# Patient Record
Sex: Female | Born: 1980 | Race: White | Hispanic: No | Marital: Single | Smoking: Never smoker
Health system: Southern US, Community
[De-identification: ages and names within clinical notes are randomized; demographics above are authoritative.]

## PROBLEM LIST (undated history)

## (undated) DIAGNOSIS — K8020 Calculus of gallbladder without cholecystitis without obstruction: Secondary | ICD-10-CM

## (undated) DIAGNOSIS — B019 Varicella without complication: Secondary | ICD-10-CM

## (undated) DIAGNOSIS — K8010 Calculus of gallbladder with chronic cholecystitis without obstruction: Secondary | ICD-10-CM

## (undated) DIAGNOSIS — N390 Urinary tract infection, site not specified: Secondary | ICD-10-CM

## (undated) DIAGNOSIS — F32A Depression, unspecified: Secondary | ICD-10-CM

## (undated) DIAGNOSIS — F419 Anxiety disorder, unspecified: Secondary | ICD-10-CM

## (undated) HISTORY — PX: CHOLECYSTECTOMY: SHX55

## (undated) HISTORY — DX: Depression, unspecified: F32.A

## (undated) HISTORY — PX: APPENDECTOMY: SHX54

## (undated) HISTORY — DX: Calculus of gallbladder with chronic cholecystitis without obstruction: K80.10

## (undated) HISTORY — DX: Varicella without complication: B01.9

---
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
## 2003-01-11 ENCOUNTER — Emergency Department (HOSPITAL_COMMUNITY): Admission: EM | Admit: 2003-01-11 | Discharge: 2003-01-11 | Payer: Self-pay | Admitting: Emergency Medicine

## 2003-01-27 ENCOUNTER — Emergency Department (HOSPITAL_COMMUNITY): Admission: EM | Admit: 2003-01-27 | Discharge: 2003-01-27 | Payer: Self-pay | Admitting: *Deleted

## 2003-02-13 ENCOUNTER — Emergency Department (HOSPITAL_COMMUNITY): Admission: AD | Admit: 2003-02-13 | Discharge: 2003-02-13 | Payer: Self-pay | Admitting: Family Medicine

## 2003-02-22 ENCOUNTER — Inpatient Hospital Stay (HOSPITAL_COMMUNITY): Admission: AD | Admit: 2003-02-22 | Discharge: 2003-02-22 | Payer: Self-pay | Admitting: Obstetrics and Gynecology

## 2006-03-12 ENCOUNTER — Emergency Department (HOSPITAL_COMMUNITY): Admission: EM | Admit: 2006-03-12 | Discharge: 2006-03-12 | Payer: Self-pay | Admitting: Emergency Medicine

## 2006-06-25 ENCOUNTER — Emergency Department (HOSPITAL_COMMUNITY): Admission: EM | Admit: 2006-06-25 | Discharge: 2006-06-25 | Payer: Self-pay | Admitting: Family Medicine

## 2009-04-04 ENCOUNTER — Emergency Department: Payer: Self-pay | Admitting: Emergency Medicine

## 2009-04-07 ENCOUNTER — Emergency Department: Payer: Self-pay | Admitting: Emergency Medicine

## 2009-08-14 ENCOUNTER — Emergency Department (HOSPITAL_COMMUNITY): Admission: EM | Admit: 2009-08-14 | Discharge: 2009-08-14 | Payer: Self-pay | Admitting: Family Medicine

## 2009-08-17 ENCOUNTER — Emergency Department: Payer: Self-pay | Admitting: Emergency Medicine

## 2010-01-21 ENCOUNTER — Emergency Department: Payer: Self-pay | Admitting: Unknown Physician Specialty

## 2010-02-28 ENCOUNTER — Emergency Department: Payer: Self-pay | Admitting: Emergency Medicine

## 2010-03-06 ENCOUNTER — Emergency Department: Payer: Self-pay | Admitting: Emergency Medicine

## 2010-03-16 ENCOUNTER — Emergency Department: Payer: Self-pay | Admitting: Emergency Medicine

## 2010-03-29 ENCOUNTER — Emergency Department: Payer: Self-pay | Admitting: Emergency Medicine

## 2010-03-30 ENCOUNTER — Emergency Department: Payer: Self-pay | Admitting: Unknown Physician Specialty

## 2010-04-09 ENCOUNTER — Emergency Department: Payer: Self-pay | Admitting: Emergency Medicine

## 2010-04-13 ENCOUNTER — Ambulatory Visit: Payer: Self-pay | Admitting: Obstetrics and Gynecology

## 2010-04-14 ENCOUNTER — Emergency Department: Payer: Self-pay | Admitting: Internal Medicine

## 2010-04-19 ENCOUNTER — Emergency Department: Payer: Self-pay | Admitting: Emergency Medicine

## 2010-05-17 ENCOUNTER — Emergency Department: Payer: Self-pay | Admitting: Emergency Medicine

## 2010-06-01 LAB — POCT RAPID STREP A (OFFICE): Streptococcus, Group A Screen (Direct): NEGATIVE

## 2010-06-20 ENCOUNTER — Emergency Department: Payer: Self-pay | Admitting: Emergency Medicine

## 2010-06-29 ENCOUNTER — Emergency Department: Payer: Self-pay | Admitting: Emergency Medicine

## 2010-07-14 ENCOUNTER — Emergency Department: Payer: Self-pay | Admitting: Emergency Medicine

## 2011-04-22 ENCOUNTER — Emergency Department: Payer: Self-pay | Admitting: Emergency Medicine

## 2011-04-22 LAB — URINALYSIS, COMPLETE
Bilirubin,UR: NEGATIVE
Blood: NEGATIVE
Glucose,UR: NEGATIVE mg/dL (ref 0–75)
Ketone: NEGATIVE
Ph: 6 (ref 4.5–8.0)
Specific Gravity: 1.013 (ref 1.003–1.030)
Squamous Epithelial: 14

## 2011-04-22 LAB — COMPREHENSIVE METABOLIC PANEL
Alkaline Phosphatase: 46 U/L — ABNORMAL LOW (ref 50–136)
Anion Gap: 11 (ref 7–16)
BUN: 3 mg/dL — ABNORMAL LOW (ref 7–18)
Bilirubin,Total: 0.3 mg/dL (ref 0.2–1.0)
Chloride: 105 mmol/L (ref 98–107)
Creatinine: 0.83 mg/dL (ref 0.60–1.30)
EGFR (African American): >60
Potassium: 3.4 mmol/L — ABNORMAL LOW (ref 3.5–5.1)
SGPT (ALT): 28 U/L
Sodium: 139 mmol/L (ref 136–145)
Total Protein: 7.9 g/dL (ref 6.4–8.2)

## 2011-04-22 LAB — CBC
HCT: 39.4 % (ref 35.0–47.0)
HGB: 13.3 g/dL (ref 12.0–16.0)
MCHC: 33.8 g/dL (ref 32.0–36.0)
MCV: 88 fL (ref 80–100)
RDW: 11.9 % (ref 11.5–14.5)

## 2011-04-22 LAB — LIPASE, BLOOD: Lipase: 130 U/L (ref 73–393)

## 2011-04-22 LAB — PREGNANCY, URINE: Pregnancy Test, Urine: POSITIVE m[IU]/mL

## 2011-04-22 LAB — HCG, QUANTITATIVE, PREGNANCY: Beta Hcg, Quant.: 1342 m[IU]/mL — ABNORMAL HIGH

## 2011-04-24 ENCOUNTER — Emergency Department (INDEPENDENT_AMBULATORY_CARE_PROVIDER_SITE_OTHER)
Admission: EM | Admit: 2011-04-24 | Discharge: 2011-04-24 | Disposition: A | Discharge status: Home / Self Care | Payer: Self-pay | Source: Home / Self Care

## 2011-04-24 ENCOUNTER — Encounter (HOSPITAL_COMMUNITY): Payer: Self-pay | Admitting: *Deleted

## 2011-04-24 DIAGNOSIS — J069 Acute upper respiratory infection, unspecified: Secondary | ICD-10-CM

## 2011-04-24 NOTE — ED Provider Notes (Signed)
History     CSN: [id number]  Arrival date & time 04/24/11  1648   None     Chief Complaint  Patient presents with  . Cough    (Consider location/radiation/quality/duration/timing/severity/associated sxs/prior treatment) HPI Comments: Pt with cough and congestion for a week. Recently pregnant, LMP last month.   Patient is a 31 y.o. female presenting with cough. The history is provided by the patient.  Cough This is a new problem. Episode onset: a week ago. The problem occurs every few minutes. The problem has not changed since onset.The cough is non-productive. There has been no fever. Associated symptoms include headaches and rhinorrhea. Pertinent negatives include no chills, no ear pain, no sore throat and no shortness of breath. She has tried nothing for the symptoms. Her past medical history does not include asthma.    History reviewed. No pertinent past medical history.  History reviewed. No pertinent past surgical history.  History reviewed. No pertinent family history.  History  Substance Use Topics  . Smoking status: Not on file  . Smokeless tobacco: Not on file  . Alcohol Use: Not on file    OB History    Grav Para Term Preterm Abortions TAB SAB Ect Mult Living   1               Review of Systems  Constitutional: Negative for fever and chills.  HENT: Positive for congestion, rhinorrhea and postnasal drip. Negative for ear pain and sore throat.        Sore throat for first couple of days of illness, no longer  Respiratory: Positive for cough. Negative for shortness of breath.   Skin: Negative for rash.  Neurological: Positive for headaches.    Allergies  Review of patient's allergies indicates no known allergies.  Home Medications   Current Outpatient Rx  Name Route Sig Dispense Refill  . AMOXICILLIN 500 MG PO CAPS Oral Take 500 mg by mouth 3 (three) times daily.      BP 143/87  Pulse 104  Temp(Src) 98.6 F (37 C) (Oral)  Resp 20  SpO2 100%  LMP  03/22/2011  Physical Exam  Constitutional: She appears well-developed and well-nourished. No distress.  HENT:  Right Ear: External ear and ear canal normal. Tympanic membrane is retracted.  Left Ear: External ear and ear canal normal. Tympanic membrane is retracted.  Nose: Mucosal edema present. Right sinus exhibits maxillary sinus tenderness. Right sinus exhibits no frontal sinus tenderness. Left sinus exhibits maxillary sinus tenderness. Left sinus exhibits no frontal sinus tenderness.  Mouth/Throat: Oropharynx is clear and moist and mucous membranes are normal.  Cardiovascular: Normal rate and normal heart sounds.   Pulmonary/Chest: Effort normal and breath sounds normal.       Occasional cough  Skin: Skin is warm and dry.    ED Course  Procedures (including critical care time)  Labs Reviewed - No data to display No results found.   No diagnosis found.    MDM          Cathlyn Parsons, NP 04/24/11 1842

## 2011-04-26 NOTE — ED Provider Notes (Signed)
Dx uri  Medical screening examination/treatment/procedure(s) were performed by non-physician practitioner and as supervising physician I was immediately available for consultation/collaboration.  Luiz Blare MD   Luiz Blare, MD 04/26/11 1013

## 2011-06-10 ENCOUNTER — Emergency Department: Payer: Self-pay | Admitting: Emergency Medicine

## 2011-06-10 LAB — URINALYSIS, COMPLETE
Bilirubin,UR: NEGATIVE
Blood: NEGATIVE
Glucose,UR: NEGATIVE mg/dL (ref 0–75)
Leukocyte Esterase: NEGATIVE
Nitrite: NEGATIVE
Specific Gravity: 1.019 (ref 1.003–1.030)
WBC UR: 1 /HPF (ref 0–5)

## 2011-06-10 LAB — WET PREP, GENITAL

## 2011-08-02 ENCOUNTER — Encounter: Payer: Self-pay | Admitting: Maternal and Fetal Medicine

## 2011-12-19 ENCOUNTER — Observation Stay: Payer: Self-pay | Admitting: Obstetrics and Gynecology

## 2011-12-26 ENCOUNTER — Inpatient Hospital Stay (HOSPITAL_COMMUNITY): Admission: AD | Admit: 2011-12-26 | Payer: Self-pay | Source: Ambulatory Visit | Admitting: Obstetrics and Gynecology

## 2011-12-27 ENCOUNTER — Inpatient Hospital Stay: Payer: Self-pay | Admitting: Obstetrics and Gynecology

## 2011-12-27 LAB — CBC WITH DIFFERENTIAL/PLATELET
Basophil #: 0 10*3/uL (ref 0.0–0.1)
Eosinophil %: 0.4 %
HCT: 35.1 % (ref 35.0–47.0)
Lymphocyte #: 1.2 10*3/uL (ref 1.0–3.6)
MCH: 31.2 pg (ref 26.0–34.0)
MCHC: 35.3 g/dL (ref 32.0–36.0)
MCV: 88 fL (ref 80–100)
Monocyte #: 0.4 x10 3/mm (ref 0.2–0.9)
Monocyte %: 3.8 %
Neutrophil #: 9.3 10*3/uL — ABNORMAL HIGH (ref 1.4–6.5)
Platelet: 167 10*3/uL (ref 150–440)
RDW: 12.9 % (ref 11.5–14.5)
WBC: 11 10*3/uL (ref 3.6–11.0)

## 2011-12-28 LAB — HEMATOCRIT: HCT: 28.3 % — ABNORMAL LOW (ref 35.0–47.0)

## 2012-01-30 ENCOUNTER — Emergency Department: Payer: Self-pay | Admitting: Emergency Medicine

## 2012-02-06 LAB — WOUND CULTURE

## 2012-02-09 ENCOUNTER — Emergency Department: Payer: Self-pay | Admitting: Emergency Medicine

## 2013-01-25 ENCOUNTER — Encounter (HOSPITAL_COMMUNITY): Payer: Self-pay | Admitting: Emergency Medicine

## 2013-01-25 ENCOUNTER — Emergency Department (HOSPITAL_COMMUNITY)
Admission: EM | Admit: 2013-01-25 | Discharge: 2013-01-25 | Disposition: A | Discharge status: Home / Self Care | Payer: Medicaid Other | Attending: Emergency Medicine | Admitting: Emergency Medicine

## 2013-01-25 DIAGNOSIS — M79609 Pain in unspecified limb: Secondary | ICD-10-CM | POA: Insufficient documentation

## 2013-01-25 DIAGNOSIS — M545 Low back pain, unspecified: Secondary | ICD-10-CM | POA: Insufficient documentation

## 2013-01-25 DIAGNOSIS — IMO0002 Reserved for concepts with insufficient information to code with codable children: Secondary | ICD-10-CM | POA: Insufficient documentation

## 2013-01-25 DIAGNOSIS — R252 Cramp and spasm: Secondary | ICD-10-CM | POA: Insufficient documentation

## 2013-01-25 DIAGNOSIS — M549 Dorsalgia, unspecified: Secondary | ICD-10-CM

## 2013-01-25 DIAGNOSIS — Z3202 Encounter for pregnancy test, result negative: Secondary | ICD-10-CM | POA: Insufficient documentation

## 2013-01-25 LAB — URINALYSIS, ROUTINE W REFLEX MICROSCOPIC
Bilirubin Urine: NEGATIVE
Hgb urine dipstick: NEGATIVE
Ketones, ur: NEGATIVE mg/dL
Leukocytes, UA: NEGATIVE
Nitrite: NEGATIVE
Urobilinogen, UA: 0.2 mg/dL (ref 0.0–1.0)
pH: 6.5 (ref 5.0–8.0)

## 2013-01-25 LAB — POCT I-STAT, CHEM 8
Calcium, Ion: 1.21 mmol/L (ref 1.12–1.23)
Creatinine, Ser: 1 mg/dL (ref 0.50–1.10)
Glucose, Bld: 104 mg/dL — ABNORMAL HIGH (ref 70–99)
HCT: 46 % (ref 36.0–46.0)
Hemoglobin: 15.6 g/dL — ABNORMAL HIGH (ref 12.0–15.0)
Potassium: 3.6 mEq/L (ref 3.5–5.1)
TCO2: 20 mmol/L (ref 0–100)

## 2013-01-25 LAB — PREGNANCY, URINE: Preg Test, Ur: NEGATIVE

## 2013-01-25 MED ORDER — MELOXICAM 7.5 MG PO TABS: 15.0000 mg | ORAL_TABLET | Freq: Every day | ORAL | Status: DC

## 2013-01-25 MED ORDER — MELOXICAM 7.5 MG PO TABS
7.5000 mg | ORAL_TABLET | Freq: Once | ORAL | Status: AC
  Administered 2013-01-25: 7.5 mg via ORAL
  Filled 2013-01-25: qty 1

## 2013-01-25 MED ORDER — PREDNISONE 20 MG PO TABS: 40.0000 mg | ORAL_TABLET | Freq: Every day | ORAL | Status: DC

## 2013-01-25 NOTE — ED Notes (Signed)
Pt presents to ed with  C/o bilateral leg cramps and leg pain. Pt also reports "cramping,numbing pain" in her lower back.

## 2013-01-25 NOTE — ED Provider Notes (Signed)
CSN: [id number]     Arrival date & time 01/25/13  1845 History   First MD Initiated Contact with Patient 01/25/13 2004     This chart was scribed for Antony Madura, by Ladona Ridgel Day, ED scribe. This patient was seen in room WTR5/WTR5 and the patient's care was started at 2004.  Chief Complaint  Patient presents with  . leg cramps   . bilateral leg pain    The history is provided by the patient. No language interpreter was used.   HPI Comments: Tammy Frost is a 32 y.o. female who presents to the Emergency Department complaining of her bilateral legs cramping and aching, onset 4 days ago. She states that it is localized to her BLEs and lower back. She denies any weakness or difficulty w/ambulation but does have some associated pain in her bilateral calf muscles while walking. She denies any fever/chills, injury to back/legs, no rashes. She states warm compress makes her symptoms a little better and cold weather makes them worse. She states minimal relief w/motrin and tylenol. She states some swelling right ankle. She denies any recent surgeries, prolonged car rides or traveling. She is not on BC. She is not on blood thinners and has no bleeding disorders.  History reviewed. No pertinent past medical history. Past Surgical History  Procedure Laterality Date  . Cesarean section     No family history on file. History  Substance Use Topics  . Smoking status: Never Smoker   . Smokeless tobacco: Not on file  . Alcohol Use: No   OB History   Grav Para Term Preterm Abortions TAB SAB Ect Mult Living   1              Review of Systems  Constitutional: Negative for fever and chills.  Respiratory: Negative for shortness of breath.   Gastrointestinal: Negative for nausea and vomiting.  Musculoskeletal: Positive for back pain.       Cramping/aching BLE and lower back.   Neurological: Negative for weakness.  All other systems reviewed and are negative.  A complete 10 system review of systems was  obtained and all systems are negative except as noted in the HPI and PMH.   Allergies  Flagyl and Sulfa antibiotics  Home Medications   Current Outpatient Rx  Name  Route  Sig  Dispense  Refill  . ibuprofen (ADVIL,MOTRIN) 200 MG tablet   Oral   Take 200 mg by mouth every 6 (six) hours as needed.         . meloxicam (MOBIC) 7.5 MG tablet   Oral   Take 2 tablets (15 mg total) by mouth daily.   30 tablet   0   . predniSONE (DELTASONE) 20 MG tablet   Oral   Take 2 tablets (40 mg total) by mouth daily.   10 tablet   0    Triage Vitals: BP 137/73  Pulse 111  Temp(Src) 97.9 F (36.6 C) (Oral)  Resp 20  SpO2 99%  LMP 11/25/2012  Breastfeeding? Unknown Physical Exam  Nursing note and vitals reviewed. Constitutional: She is oriented to person, place, and time. She appears well-developed and well-nourished. No distress.  HENT:  Head: Normocephalic and atraumatic.  Mouth/Throat: Oropharynx is clear and moist. No oropharyngeal exudate.  Eyes: Conjunctivae and EOM are normal. No scleral icterus.  Neck: Normal range of motion. Neck supple. No tracheal deviation present.  Cardiovascular: Normal rate, regular rhythm and intact distal pulses.   Pulses:  Dorsalis pedis pulses are 2+ on the right side, and 2+ on the left side.       Posterior tibial pulses are 2+ on the right side, and 2+ on the left side.  Pulmonary/Chest: Effort normal. No respiratory distress.  Musculoskeletal: Normal range of motion. She exhibits tenderness.  Normal ROM of back with mild tenderness to palpation of her left lumbosacral paraspinal muscles. No bony abnormalities or step offs. No TTP of b/l calves. No lower extremity swelling or edema.  Neurological: She is alert and oriented to person, place, and time. She has normal reflexes.  DTRs normal and symmetric 5/5 strength against resistance of b/l lower extremities.   Skin: Skin is warm and dry. No rash noted. No erythema. No pallor.  Psychiatric:  She has a normal mood and affect. Her behavior is normal.    ED Course  Procedures (including critical care time) DIAGNOSTIC STUDIES: Oxygen Saturation is 99% on room air, normal by my interpretation.    COORDINATION OF CARE: At 830 PM Discussed treatment plan with patient which includes UA, blood work. Patient agrees.   Labs Review Labs Reviewed  URINALYSIS, ROUTINE W REFLEX MICROSCOPIC - Abnormal; Notable for the following:    Specific Gravity, Urine 1.002 (*)    All other components within normal limits  POCT I-STAT, CHEM 8 - Abnormal; Notable for the following:    BUN 4 (*)    Glucose, Bld 104 (*)    Hemoglobin 15.6 (*)    All other components within normal limits  PREGNANCY, URINE   Imaging Review No results found.  EKG Interpretation   None       MDM   1. Leg cramps   2. Back pain    Lower extremity cramping b/l with L sided back pain. Patient well and nontoxic appearing, hemodynamically stable, and afebrile. She is neurovascularly intact with normal ROM of back. Patient ambulatory with normal gait and without claudication. Urine pregnancy negative and there is no electrolyte imbalance. Very low suspicion of DVT in this patient, especially since b/l nature of symptoms is atypical for this. Patient stable for d/c with Rx for Mobic and prednisone for symptoms. PCP follow up advised. Patient agreeable to plan with no unaddressed concerns.  I personally performed the services described in this documentation, which was scribed in my presence. The recorded information has been reviewed and is accurate.       Antony Madura, PA-C 02/02/13 2053

## 2013-02-05 NOTE — ED Provider Notes (Signed)
  Medical screening examination/treatment/procedure(s) were performed by non-physician practitioner and as supervising physician I was immediately available for consultation/collaboration.      Loisann Roach, MD 02/05/13 1042 

## 2013-06-03 ENCOUNTER — Encounter (HOSPITAL_COMMUNITY): Payer: Self-pay | Admitting: Emergency Medicine

## 2013-06-03 ENCOUNTER — Emergency Department (HOSPITAL_COMMUNITY)
Admission: EM | Admit: 2013-06-03 | Discharge: 2013-06-03 | Disposition: A | Discharge status: Home / Self Care | Payer: Medicaid Other | Source: Home / Self Care | Attending: Emergency Medicine | Admitting: Emergency Medicine

## 2013-06-03 DIAGNOSIS — O219 Vomiting of pregnancy, unspecified: Secondary | ICD-10-CM

## 2013-06-03 LAB — POCT PREGNANCY, URINE: PREG TEST UR: POSITIVE — AB

## 2013-06-03 MED ORDER — ONDANSETRON 4 MG PO TBDP: 4.0000 mg | ORAL_TABLET | Freq: Four times a day (QID) | ORAL | Status: DC | PRN

## 2013-06-03 MED ORDER — ONDANSETRON 4 MG PO TBDP
8.0000 mg | ORAL_TABLET | Freq: Once | ORAL | Status: AC
  Administered 2013-06-03: 8 mg via ORAL

## 2013-06-03 MED ORDER — ONDANSETRON 4 MG PO TBDP
ORAL_TABLET | ORAL | Status: AC
  Filled 2013-06-03: qty 2

## 2013-06-03 NOTE — ED Provider Notes (Signed)
Medical screening examination/treatment/procedure(s) were performed by resident physician or non-physician practitioner and as supervising physician I was immediately available for consultation/collaboration.   KINDL,JAMES DOUGLAS MD.   James D Kindl, MD 06/03/13 1959 

## 2013-06-03 NOTE — Discharge Instructions (Signed)
Hyperemesis Gravidarum  Hyperemesis gravidarum is a severe form of nausea and vomiting that happens during pregnancy. Hyperemesis is worse than morning sickness. It may cause you to have nausea or vomiting all day for many days. It may keep you from eating and drinking enough food and liquids. Hyperemesis usually occurs during the first half (the first 20 weeks) of pregnancy. It often goes away once a woman is in her second half of pregnancy. However, sometimes hyperemesis continues through an entire pregnancy.   CAUSES   The cause of this condition is not completely known but is thought to be related to changes in the body's hormones when pregnant. It could be from the high level of the pregnancy hormone or an increase in estrogen in the body.   SIGNS AND SYMPTOMS   · Severe nausea and vomiting.  · Nausea that does not go away.  · Vomiting that does not allow you to keep any food down.  · Weight loss and body fluid loss (dehydration).  · Having no desire to eat or not liking food you have previously enjoyed.  DIAGNOSIS   Your health care provider will do a physical exam and ask you about your symptoms. He or she may also order blood tests and urine tests to make sure something else is not causing the problem.   TREATMENT   You may only need medicine to control the problem. If medicines do not control the nausea and vomiting, you will be treated in the hospital to prevent dehydration, increased acid in the blood (acidosis), weight loss, and changes in the electrolytes in your body that may harm the unborn baby (fetus). You may need IV fluids.   HOME CARE INSTRUCTIONS   · Only take over-the-counter or prescription medicines as directed by your health care provider.  · Try eating a couple of dry crackers or toast in the morning before getting out of bed.  · Avoid foods and smells that upset your stomach.  · Avoid fatty and spicy foods.  · Eat 5 6 small meals a day.  · Do not drink when eating meals. Drink between  meals.  · For snacks, eat high-protein foods, such as cheese.  · Eat or suck on things that have ginger in them. Ginger helps nausea.  · Avoid food preparation. The smell of food can spoil your appetite.  · Avoid iron pills and iron in your multivitamins until after 3 4 months of being pregnant. However, consult with your health care provider before stopping any prescribed iron pills.  SEEK MEDICAL CARE IF:   · Your abdominal pain increases.  · You have a severe headache.  · You have vision problems.  · You are losing weight.  SEEK IMMEDIATE MEDICAL CARE IF:   · You are unable to keep fluids down.  · You vomit blood.  · You have constant nausea and vomiting.  · You have excessive weakness.  · You have extreme thirst.  · You have dizziness or fainting.  · You have a fever or persistent symptoms for more than 2 3 days.  · You have a fever and your symptoms suddenly get worse.  MAKE SURE YOU:   · Understand these instructions.  · Will watch your condition.  · Will get help right away if you are not doing well or get worse.  Document Released: 03/01/2005 Document Revised: 12/20/2012 Document Reviewed: 10/11/2012  [employer]® Patient Information ©2014 [employer].

## 2013-06-03 NOTE — ED Notes (Signed)
No Care handoff done. Wrong chart.

## 2013-06-03 NOTE — ED Notes (Signed)
C/o vomiting onset yesterday x 4 and 8-9 x today.  Headache onset today.  She could not keep water down this AM, can keep some ice chips down.  No diarrhea, chills or fever. LMP 1/6 or 1/7.  Periods are irregular.

## 2013-06-03 NOTE — ED Provider Notes (Signed)
CSN: [id number]     Arrival date & time 06/03/13  1751 History   None    Chief Complaint  Patient presents with  . Emesis  . Headache   (Consider location/radiation/quality/duration/timing/severity/associated sxs/prior Treatment) HPI Comments: 33 year old female presents for evaluation of nausea, vomiting, and headache. Her symptoms began yesterday. She had 4 episodes of vomiting yesterday and has had about 10 episodes of vomiting today. She has not been able to hold down any solid foods or liquids, but she was able to eat some ice chips without vomiting. Her headache started today as well, and was previously 5/10 in severity but is down to 3/10 with Tylenol. She denies any diarrhea. She denies any abdominal pain. She also admits to history of irregular periods, she does not have a menstrual period since January 6 but this is not abnormal for. She is currently sexually active.  Patient is a 33 y.o. female presenting with vomiting and headaches.  Emesis Associated symptoms: headaches   Associated symptoms: no abdominal pain, no arthralgias, no chills and no myalgias   Headache Associated symptoms: nausea and vomiting   Associated symptoms: no abdominal pain, no cough, no dizziness, no fever and no myalgias     No past medical history on file. Past Surgical History  Procedure Laterality Date  . Cesarean section  12/27/2011   No family history on file. History  Substance Use Topics  . Smoking status: Never Smoker   . Smokeless tobacco: Not on file  . Alcohol Use: No   OB History   Grav Para Term Preterm Abortions TAB SAB Ect Mult Living   1              Review of Systems  Constitutional: Negative for fever and chills.  Eyes: Negative for visual disturbance.  Respiratory: Negative for cough and shortness of breath.   Cardiovascular: Negative for chest pain, palpitations and leg swelling.  Gastrointestinal: Positive for nausea and vomiting. Negative for abdominal pain.   Endocrine: Negative for polydipsia and polyuria.  Genitourinary: Negative for dysuria, urgency and frequency.  Musculoskeletal: Negative for arthralgias and myalgias.  Skin: Negative for rash.  Neurological: Positive for headaches. Negative for dizziness, weakness and light-headedness.    Allergies  Flagyl and Sulfa antibiotics  Home Medications   Current Outpatient Rx  Name  Route  Sig  Dispense  Refill  . acetaminophen (TYLENOL) 500 MG tablet   Oral   Take 1,000 mg by mouth every 6 (six) hours as needed for headache.         . ibuprofen (ADVIL,MOTRIN) 200 MG tablet   Oral   Take 200 mg by mouth every 6 (six) hours as needed.         . meloxicam (MOBIC) 7.5 MG tablet   Oral   Take 2 tablets (15 mg total) by mouth daily.   30 tablet   0   . ondansetron (ZOFRAN ODT) 4 MG disintegrating tablet   Oral   Take 1 tablet (4 mg total) by mouth every 6 (six) hours as needed for nausea or vomiting.   20 tablet   0   . predniSONE (DELTASONE) 20 MG tablet   Oral   Take 2 tablets (40 mg total) by mouth daily.   10 tablet   0    BP 119/84  Pulse 87  Temp(Src) 98.7 F (37.1 C) (Oral)  Resp 16  SpO2 99%  LMP 03/20/2013  Breastfeeding? No Physical Exam  Nursing note and vitals reviewed. Constitutional:  She is oriented to person, place, and time. Vital signs are normal. She appears well-developed and well-nourished. No distress.  HENT:  Head: Normocephalic and atraumatic.  Cardiovascular: Normal rate, regular rhythm and normal heart sounds.  Exam reveals no gallop and no friction rub.   No murmur heard. Pulmonary/Chest: Effort normal and breath sounds normal. No respiratory distress. She has no wheezes. She has no rales.  Abdominal: Soft. She exhibits no mass. There is no tenderness. There is no rebound and no guarding.  Neurological: She is alert and oriented to person, place, and time. She has normal strength. Coordination normal.  Skin: Skin is warm and dry. No rash  noted. She is not diaphoretic.  Psychiatric: She has a normal mood and affect. Judgment normal.    ED Course  Procedures (including critical care time) Labs Review Labs Reviewed  POCT PREGNANCY, URINE - Abnormal; Notable for the following:    Preg Test, Ur POSITIVE (*)    All other components within normal limits   Imaging Review No results found.   MDM   1. Pregnancy related nausea and vomiting, antepartum    Improved with zofran.  Rx for zofran, increase fluids, f/u with OB   Meds ordered this encounter  Medications  . acetaminophen (TYLENOL) 500 MG tablet    Sig: Take 1,000 mg by mouth every 6 (six) hours as needed for headache.  . ondansetron (ZOFRAN-ODT) disintegrating tablet 8 mg    Sig:   . ondansetron (ZOFRAN ODT) 4 MG disintegrating tablet    Sig: Take 1 tablet (4 mg total) by mouth every 6 (six) hours as needed for nausea or vomiting.    Dispense:  20 tablet    Refill:  0    Order Specific Question:  Supervising Provider    Answer:  Bradd Canary D [[id number]]       Graylon Good, PA-C 06/03/13 1955

## 2013-06-25 ENCOUNTER — Emergency Department: Payer: Self-pay | Admitting: Emergency Medicine

## 2013-06-25 LAB — URINALYSIS, COMPLETE
Bilirubin,UR: NEGATIVE
Blood: NEGATIVE
GLUCOSE, UR: NEGATIVE mg/dL (ref 0–75)
Ketone: NEGATIVE
Leukocyte Esterase: NEGATIVE
NITRITE: NEGATIVE
PH: 7 (ref 4.5–8.0)
Protein: NEGATIVE
RBC,UR: NONE SEEN /HPF (ref 0–5)
SPECIFIC GRAVITY: 1.002 (ref 1.003–1.030)
Squamous Epithelial: 1
WBC UR: NONE SEEN /HPF (ref 0–5)

## 2013-06-25 LAB — CBC WITH DIFFERENTIAL/PLATELET
Basophil #: 0 10*3/uL (ref 0.0–0.1)
Basophil %: 0.3 %
EOS ABS: 0.1 10*3/uL (ref 0.0–0.7)
EOS PCT: 0.7 %
HCT: 39.4 % (ref 35.0–47.0)
HGB: 13.3 g/dL (ref 12.0–16.0)
LYMPHS ABS: 1.6 10*3/uL (ref 1.0–3.6)
Lymphocyte %: 17.5 %
MCH: 28.4 pg (ref 26.0–34.0)
MCHC: 33.8 g/dL (ref 32.0–36.0)
MCV: 84 fL (ref 80–100)
MONOS PCT: 4.1 %
Monocyte #: 0.4 x10 3/mm (ref 0.2–0.9)
NEUTROS ABS: 7 10*3/uL — AB (ref 1.4–6.5)
Neutrophil %: 77.4 %
PLATELETS: 184 10*3/uL (ref 150–440)
RBC: 4.7 10*6/uL (ref 3.80–5.20)
RDW: 13.4 % (ref 11.5–14.5)
WBC: 9 10*3/uL (ref 3.6–11.0)

## 2013-06-25 LAB — COMPREHENSIVE METABOLIC PANEL
ALT: 25 U/L (ref 12–78)
ANION GAP: 6 — AB (ref 7–16)
AST: 8 U/L — AB (ref 15–37)
Albumin: 3.3 g/dL — ABNORMAL LOW (ref 3.4–5.0)
Alkaline Phosphatase: 53 U/L
BUN: 6 mg/dL — ABNORMAL LOW (ref 7–18)
Bilirubin,Total: 0.2 mg/dL (ref 0.2–1.0)
CALCIUM: 9.6 mg/dL (ref 8.5–10.1)
Chloride: 103 mmol/L (ref 98–107)
Co2: 27 mmol/L (ref 21–32)
Creatinine: 0.76 mg/dL (ref 0.60–1.30)
EGFR (Non-African Amer.): >60
Glucose: 78 mg/dL (ref 65–99)
OSMOLALITY: 268 (ref 275–301)
Potassium: 3.7 mmol/L (ref 3.5–5.1)
Sodium: 136 mmol/L (ref 136–145)
Total Protein: 7.8 g/dL (ref 6.4–8.2)

## 2013-06-25 LAB — LIPASE, BLOOD: LIPASE: 88 U/L (ref 73–393)

## 2013-06-25 LAB — HCG, QUANTITATIVE, PREGNANCY: Beta Hcg, Quant.: 57418 m[IU]/mL — ABNORMAL HIGH

## 2013-10-23 ENCOUNTER — Observation Stay: Payer: Self-pay | Admitting: Obstetrics and Gynecology

## 2013-10-23 LAB — PIH PROFILE
Anion Gap: 11 (ref 7–16)
BUN: 5 mg/dL — ABNORMAL LOW (ref 7–18)
CHLORIDE: 104 mmol/L (ref 98–107)
CO2: 23 mmol/L (ref 21–32)
CREATININE: 0.84 mg/dL (ref 0.60–1.30)
Calcium, Total: 8.5 mg/dL (ref 8.5–10.1)
EGFR (African American): >60
Glucose: 189 mg/dL — ABNORMAL HIGH (ref 65–99)
HCT: 34 % — AB (ref 35.0–47.0)
HGB: 11.4 g/dL — AB (ref 12.0–16.0)
MCH: 30.1 pg (ref 26.0–34.0)
MCHC: 33.4 g/dL (ref 32.0–36.0)
MCV: 90 fL (ref 80–100)
OSMOLALITY: 278 (ref 275–301)
PLATELETS: 145 10*3/uL — AB (ref 150–440)
Potassium: 3.4 mmol/L — ABNORMAL LOW (ref 3.5–5.1)
RBC: 3.77 10*6/uL — ABNORMAL LOW (ref 3.80–5.20)
RDW: 12.4 % (ref 11.5–14.5)
SGOT(AST): 17 U/L (ref 15–37)
Sodium: 138 mmol/L (ref 136–145)
URIC ACID: 2.8 mg/dL (ref 2.6–6.0)
WBC: 10.1 10*3/uL (ref 3.6–11.0)

## 2013-10-23 LAB — HEPATIC FUNCTION PANEL A (ARMC)
ALK PHOS: 82 U/L
ALT: 23 U/L
Albumin: 2.2 g/dL — ABNORMAL LOW (ref 3.4–5.0)
BILIRUBIN DIRECT: 0.1 mg/dL (ref 0.00–0.20)
Bilirubin,Total: 0.4 mg/dL (ref 0.2–1.0)
SGOT(AST): 31 U/L (ref 15–37)
Total Protein: 6 g/dL — ABNORMAL LOW (ref 6.4–8.2)

## 2013-10-23 LAB — URINALYSIS, COMPLETE
BLOOD: NEGATIVE
Bilirubin,UR: NEGATIVE
Glucose,UR: >=500 mg/dL (ref 0–75)
Leukocyte Esterase: NEGATIVE
NITRITE: NEGATIVE
PH: 6 (ref 4.5–8.0)
RBC,UR: 2 /HPF (ref 0–5)
Specific Gravity: 1.032 (ref 1.003–1.030)
Squamous Epithelial: 11
WBC UR: 4 /HPF (ref 0–5)

## 2013-10-23 LAB — AMYLASE: Amylase: 50 U/L (ref 25–115)

## 2013-10-23 LAB — LIPASE, BLOOD: Lipase: 78 U/L (ref 73–393)

## 2013-11-25 ENCOUNTER — Observation Stay: Payer: Self-pay | Admitting: Obstetrics and Gynecology

## 2013-11-25 LAB — URINALYSIS, COMPLETE
Bilirubin,UR: NEGATIVE
Blood: NEGATIVE
Glucose,UR: NEGATIVE mg/dL (ref 0–75)
Ketone: NEGATIVE
Leukocyte Esterase: NEGATIVE
NITRITE: NEGATIVE
PH: 6 (ref 4.5–8.0)
PROTEIN: NEGATIVE
Specific Gravity: 1.009 (ref 1.003–1.030)
Squamous Epithelial: 4
WBC UR: 2 /HPF (ref 0–5)

## 2013-12-17 ENCOUNTER — Observation Stay: Payer: Self-pay | Admitting: Obstetrics & Gynecology

## 2013-12-28 ENCOUNTER — Ambulatory Visit: Payer: Self-pay | Admitting: Obstetrics and Gynecology

## 2013-12-30 ENCOUNTER — Inpatient Hospital Stay: Payer: Self-pay | Admitting: Obstetrics and Gynecology

## 2013-12-31 LAB — CBC WITH DIFFERENTIAL/PLATELET
Basophil #: 0.1 10*3/uL (ref 0.0–0.1)
Basophil %: 0.8 %
Eosinophil #: 0.1 10*3/uL (ref 0.0–0.7)
Eosinophil %: 1.1 %
HCT: 34.7 % — ABNORMAL LOW (ref 35.0–47.0)
HGB: 11.1 g/dL — ABNORMAL LOW (ref 12.0–16.0)
Lymphocyte #: 1.9 10*3/uL (ref 1.0–3.6)
Lymphocyte %: 23.3 %
MCH: 27.6 pg (ref 26.0–34.0)
MCHC: 32.1 g/dL (ref 32.0–36.0)
MCV: 86 fL (ref 80–100)
Monocyte #: 0.4 x10 3/mm (ref 0.2–0.9)
Monocyte %: 4.6 %
Neutrophil #: 5.9 10*3/uL (ref 1.4–6.5)
Neutrophil %: 70.2 %
Platelet: 169 10*3/uL (ref 150–440)
RBC: 4.03 10*6/uL (ref 3.80–5.20)
RDW: 12.7 % (ref 11.5–14.5)
WBC: 8.3 10*3/uL (ref 3.6–11.0)

## 2014-01-01 LAB — HEMATOCRIT: HCT: 35.4 % (ref 35.0–47.0)

## 2014-01-02 LAB — PATHOLOGY REPORT

## 2014-01-14 ENCOUNTER — Encounter (HOSPITAL_COMMUNITY): Payer: Self-pay | Admitting: Emergency Medicine

## 2014-07-02 NOTE — Op Note (Signed)
PATIENT NAME:  Tammy, Frost MR#:  [id number] DATE OF BIRTH:  1980-12-19  DATE OF PROCEDURE:  12/27/2011  PREOPERATIVE DIAGNOSES:  1. 40.1 week intrauterine pregnancy, undelivered.  2. Suspected CPD.   POSTOPERATIVE DIAGNOSES:  1. 40.1 week intrauterine pregnancy, delivered.  2. Suspected CPD.  3. Viable female, 7 pounds, 13 ounces. 4. History of GBS positive.   OPERATIVE PROCEDURE: Primary low cervical transverse Cesarean section.   SURGEON: Prentice Docker. Avondre Richens, MD   FIRST ASSISTANT: None.   ANESTHESIA: Spinal.   INDICATIONS: The patient is a 34 year old white female gravida 1 para 0 at 40.[redacted] weeks gestation who was admitted with spontaneous rupture of membranes and suspected cephalopelvic disproportion. The patient's exam was notable for 2 cm dilated, 90% effacement and -4 station; the fetal head was out of the pelvis and floating. The patient also had GBS positive status. In counseling options with the patient regarding delivery mode, she opted for primary Cesarean section delivery.   FINDINGS AT SURGERY: Viable female infant 7 pounds, 13 ounces having Apgars of 9 and 9 at one and five minutes respectively. The uterus, tubes, and ovaries were grossly normal. Adhesiolysis was done on the fallopian tubes and ovaries due to bilateral filmy adhesions.   DESCRIPTION OF PROCEDURE: The patient was brought to the operating room where she was placed in the sitting position. Spinal anesthetic was introduced without difficulty. She was placed in the supine position with a right lateral hip roll in place. A ChloraPrep abdominal and perineal prep and drape was performed in the standard fashion. A Foley catheter was draining clear yellow urine from the bladder. After checking for adequate level of anesthesia, a Pfannenstiel incision was made into the abdomen. The fascia was incised transversely and extended bilaterally with Mayo scissors. Midline raphe was identified, incised, separated, and the peritoneum  was entered. The bladder blade was placed. Bladder flap was created over lower uterine segment through sharp dissection. A low transverse incision was then made in the uterus and this was extended bluntly bilaterally. The infant was delivered through a vertex presentation. He was vigorous at birth. Oropharynx and nasopharynx were bulb suctioned during the delivery. The umbilical cord was doubly clamped and cut and the infant was handed off to the awaiting resuscitation team. Cord blood sampling was obtained. The placenta was expressed from the uterus. The uterus was externalized onto the anterior abdominal wall and was cleared of all debris with laps. The incision was closed in two layers with #1 chromic suture. The first layer was a running locking stitch. The second layer was an imbricating layer. Filmy adhesions involving the fallopian tubes and ovaries were lysed using Bovie cautery. Following return to normal anatomy, the uterus, tubes, and ovaries were placed back into the abdominal pelvic cavity. Gutters were cleared of all debris with laps. The incision was then closed in layers with 0 Maxon being used on the fascia in a simple running manner. The subcutaneous tissues were reapproximated using 2-0 Vicryl suture. The skin was closed with 4-0 Vicryl in a subcuticular stitch. Dermabond was applied and a pelvic dressing was placed over the incision. The patient was then mobilized and taken to the recovery room in satisfactory condition. Estimated blood loss was 500 mL. All instruments, needles, and sponge counts were verified as correct. The patient did receive Ancef 2 grams antibiotic prophylaxis prior to the incision.   ____________________________ Prentice Docker Tammy Haslem, MD mad:drc D: 12/28/2011 07:15:00 ET T: 12/28/2011 08:47:54 ET JOB#: [id number]  cc: Tammy Deutscher A. Oberon Hehir,  MD, <Dictator> [facility] Prentice DockerMARTIN A Tammy Carvey MD ELECTRONICALLY SIGNED 12/30/2011 18:54

## 2014-07-06 NOTE — Consult Note (Signed)
PATIENT NAME:  Tammy Frost, Tammy Frost MR#:  [id number] DATE OF BIRTH:  [birth year]  DATE OF CONSULTATION:  10/23/2013  REFERRING PHYSICIAN:   CONSULTING PHYSICIAN:  Cristal Deerhristopher A. Kamsiyochukwu Buist, MD  REASON FOR CONSULTATION: Mid epigastric pain, back pain, nausea, vomiting and gallstones.   HISTORY OF PRESENT ILLNESS: Ms. Tammy Frost is a pleasant 34 year old female who is pregnant with her second child at 9929 weeks gestation. She reports that after eating fried chicken for dinner last night she developed what she thought was initially gas pain and later became severe right upper quadrant radiating to back pain. She also had associated nausea and vomiting with the pain. It has since resolved. She has never had that pain before. No fevers, chills, night sweats, shortness of breath, cough, chest pain, diarrhea, constipation, dysuria or hematuria. No sick contacts. No unusual ingestions.   PAST MEDICAL HISTORY: Previous child birth by C-section.   ALLERGIES: No known drug allergies.   HOME MEDICATIONS: Prenatal vitamins.   SOCIAL HISTORY: Denies tobacco or alcohol use.   FAMILY HISTORY: Has a history only for COPD and emphysema secondary to smoking in the family.   REVIEW OF SYSTEMS: A 12 point review of systems was obtained. Pertinent positives and negatives as above.   PHYSICAL EXAMINATION: VITAL SIGNS: Afebrile. Vital signs stable.  GENERAL: No acute distress. Alert and oriented x3. HEAD: Normocephalic, atraumatic.  EYES: No scleral icterus. No conjunctivitis.  FACE: No obvious facial trauma. Normal external nose. Normal external ears.  CHEST: Lungs clear to auscultation, moving air well.  HEART: Regular rate and rhythm. No murmurs, rubs, or gallops.  ABDOMEN: Soft, gravid, nontender to deep palpation.  EXTREMITIES: Moves all extremities well. Strength 5/5.  NEUROLOGIC: Cranial nerves II through XII grossly intact.   DIAGNOSTIC DATA: White cell count 10.1, hemoglobin 11.4, platelets 145,000. Potassium  3.4. Glucose 189.   Ultrasound shows a mobile gallstones in gallbladder. No pericholecystic fluid. No gallbladder wall thickening.   ASSESSMENT AND PLAN: Ms. Tammy Frost is a pleasant 34 year old female who is [redacted] weeks gestation with what sounds like symptomatic cholelithiasis, is currently without pain. Due to the patient gestational at 29 weeks and nonemergent need, no indication for cholecystectomy at this time. May follow up as an outpatient for cholecystectomy. Has been told to return if has intractable pain and nausea and vomiting in the future, particularly in the context of fever.  ____________________________ Si Raiderhristopher A. Ivey Nembhard, MD cal:sb D: 10/23/2013 10:37:54 ET T: 10/23/2013 11:52:31 ET JOB#: [id number]  cc: Cristal Deerhristopher A. Trystin Hargrove, MD, <Dictator> Jarvis NewcomerHRISTOPHER A Rashmi Tallent MD ELECTRONICALLY SIGNED 11/05/2013 21:05

## 2014-07-06 NOTE — Op Note (Signed)
PATIENT NAME:  Tammy Frost, Tammy Frost MR#:  [id number] DATE OF BIRTH:  April 10, 1980  DATE OF PROCEDURE:  12/31/2013  PREOPERATIVE DIAGNOSES: 1.  History of cesarean section, desiring repeat cesarean section.  2.  Multiparous, desiring permanent sterilization.  POSTOPERATIVE DIAGNOSES:  1.  History of cesarean section, desiring repeat cesarean section.  2.  Multiparous, desiring permanent sterilization.  PROCEDURE: Repeat low transverse cesarean section with bilateral tubal ligation, [facility] method.   SURGEON: Hildred LaserAnika Sherilyn Windhorst, MD  ANESTHESIA: Spinal   INTRAVENOUS FLUIDS: 2000 mL.  URINE OUTPUT: 200 mL.  ESTIMATED BLOOD LOSS: 500 mL.   FINDINGS: Delivered from cephalic presentation was a 3560 gram female with Apgars of 8 and 7 at one minute and five minutes, respectively. There was Apgar of 8 at ten minutes. Normal-appearing placenta. There was clear amniotic fluid at rupture. The uterine outlying tubes and ovaries appeared normal. There were dense adhesions of the peritoneum and omentum to the anterior surface of the uterus and then filmy adhesions of the bilateral fallopian tubes to the remaining adnexa.   DRAINS: Foley catheter to gravity.  ANTIBIOTICS: 2 grams Ancef was given prior to the procedure.   SPECIMEN: Placenta.   COMPLICATIONS: None. The patient tolerated the procedure well.   DESCRIPTION OF PROCEDURE: The patient was taken to the operating room where she was placed under spinal anesthesia without difficulty. She was then prepped and draped in normal sterile fashion. Two grams of Ancef were given. A timeout was held and the above information was confirmed. The spinal was found to be adequate.   A Pfannenstiel incision was made and carried down to the level of the subcutaneous tissue using the knife and the Bovie. The tissue was then carried down to the fascia with the Bovie. Fascia was incised in the midline and the incision was extended laterally using Mayo scissors. The superior  aspect of the fascial incision was then grasped, tented up with Kocher clamps and the rectus muscles were separated off sharply. Attention was then turned inferiorly which in a similar fashion the fascia was grasped with Kocher clamps, tented up and dissected off sharply. The rectus muscles were separated in the midline. The peritoneum was identified and entered. The peritoneal incision was then extended superiorly and inferiorly with good visualization of the bladder. The above findings were noted. The uterovesical peritoneal reflection was attempted to be grasped and incised; however, there was noted to be adhesions of the bladder to the lower uterine segment and so a bladder flap could not be created. Uterine incision was made after the bladder blade was inserted. A low transverse uterine incision was made along the lower uterine segment. The uterine incision was then extended laterally and superiorly bluntly. The bladder blade was removed and the infant's head was delivered atraumatically. There was no nuchal cord. Nose and mouth were suctioned. The infant was handed off to the pediatrician. Delivered from vertex presentation was a 3560 gram female with Apgars of 8 and 7 at five minutes, respectively. After the umbilical cord was clamped and cut, the placenta was manually removed intact and appeared normal. Next, the placenta was removed manually and intact and appeared normal. The uterus was exteriorized and cleared of all clots and debris. The uterine incision was then closed using running locked suture of 0 Vicryl. An imbricating suture of 0 Vicryl was then used to increase hemostasis. Hemostasis was observed. Next, attention was then turned to the fallopian tubes. The left fallopian tube was then grasped, tented up with a  Babcock clamp and tubal ligation was performed in a standard [facility] fashion. A segment of the left fallopian tube was excised and handed off for pathology. The same procedure was performed  on the right side. Due to filmy adhesions being present, which precluded being able to perform sterilization, these adhesions were taken down using the Bovie. Hemostasis was again observed. The uterus was then placed back into the abdomen. The fascia was then closed using a running suture of 0 Vicryl. The subcutaneous fat layer was then closed using a suture of 3-0 Vicryl, after irrigation was performed, in an interrupted fashion. The skin was closed using 4-0 Monocryl. Dermabond was then placed over the incision. A Lidoderm patch was placed above and below the incision, and a dressing was applied. The patient tolerated the procedure well. She was taken to the PAC-U in stable condition.  ____________________________ Jacques Earthly Valentino Saxon, MD asc:sb D: 12/31/2013 09:28:15 ET T: 12/31/2013 09:48:23 ET JOB#: [id number]  cc: Jacques Earthly. Valentino Saxon, MD, <Dictator> Fabian November MD ELECTRONICALLY SIGNED 01/14/2014 8:01

## 2014-07-23 NOTE — H&P (Signed)
L&D Evaluation:  History:  HPI 33 yowf G2P1001 at 4329 weeksgestation admitted with severe mid epigastric and back pain associated with intractible nausea and vomiting. Prenatal course uneventful to date.   Patient's Surgical History Previous C-Section   Medications Pre [place]atal Vitamins   Allergies NKDA   Social History none   Family History Non-Contributory   ROS:  HEENT normal   CNS No PIH symptoms   GI as per HPI   GU No UTI symptoms   Resp normal   CV normal   Renal normal   MS as per HPI   Exam:  Vital Signs stable   Urine Protein 1+, 1+ ketones   General no apparent distress, This AM   Mental Status clear    Heart normal sinus rhythm   Abdomen gravid, non-tender   Estimated Fetal Weight Average for gestational age   Back no CVAT   Pelvic no external lesions   Mebranes Intact   FHT normal rate with no decels   Ucx absent   Skin dry   Lymph no lymphadenopathy    Impression:  Impression 29 week Intrauterine pregnancy with N/V and Midepigastric/Back pain;   Plan:  Plan UA, EFM/NST, PIH panel, fluids, Lipase/amylase; RUQ U/S; IV antiemitic and analgesics   Comments U/S - Gallstones General Surgery Consultation   Electronic Signatures: Ana Liaw, Prentice DockerMartin A (MD)  (Signed 11-Aug-15 07:42)  Authored: L&D Evaluation   Last Updated: 11-Aug-15 07:42 by Kipp Shank, Prentice DockerMartin A (MD)

## 2014-07-23 NOTE — H&P (Signed)
L&D Evaluation:  History:   HPI [age] yowf G1P0 at 40.1 weeks admitted with suspected ROM. Irregular contractions. 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    Presents with leaking fluid    Patient's Medical History Increased BMI; Rubella Equivocal/Varicella Equivocal.    Patient's Surgical History none    Medications Pre Natal Vitamins    Allergies Flagyl    Social History none    Family History Non-Contributory   Exam:   Vital Signs stable    Urine Protein negative dipstick    General no apparent distress    Chest clear    Heart normal sinus rhythm    Abdomen gravid, non-tender, 8#8oz    Estimated Fetal Weight Average for gestational age    Back no CVAT    Edema 1+    Pelvic no external lesions, FT/90/-4 OOP/?SROM    FHT normal rate with no decels    Ucx irregular    Skin no lesions, no rashes    Lymph no lymphadenopathy    Other A+/ATB-/NR/Rubella equivocal/Varicella equivocal/ HB-/HIV-/GBS+   Impression:   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 She is accepting of allm risks including but not limited to bleeding/infection/pelvic organ injury with need for repair/fetal injury/blood clot disorders/anesthesia risks/etc. All question are answered. Informed consent is given.   Electronic Signatures: Kirbie Stodghill, Prentice DockerMartin A (MD)  (Signed 14-Oct-13 15:07)  Authored: L&D Evaluation   Last Updated: 14-Oct-13 15:07 by Shalea Tomczak, Prentice DockerMartin A (MD)

## 2014-10-06 ENCOUNTER — Emergency Department
Admission: EM | Admit: 2014-10-06 | Discharge: 2014-10-06 | Disposition: A | Discharge status: Home / Self Care | Payer: Medicaid Other | Attending: Emergency Medicine | Admitting: Emergency Medicine

## 2014-10-06 DIAGNOSIS — Z791 Long term (current) use of non-steroidal anti-inflammatories (NSAID): Secondary | ICD-10-CM | POA: Insufficient documentation

## 2014-10-06 DIAGNOSIS — Z7952 Long term (current) use of systemic steroids: Secondary | ICD-10-CM | POA: Insufficient documentation

## 2014-10-06 DIAGNOSIS — Z79899 Other long term (current) drug therapy: Secondary | ICD-10-CM | POA: Insufficient documentation

## 2014-10-06 DIAGNOSIS — K0381 Cracked tooth: Secondary | ICD-10-CM | POA: Insufficient documentation

## 2014-10-06 DIAGNOSIS — Z792 Long term (current) use of antibiotics: Secondary | ICD-10-CM | POA: Insufficient documentation

## 2014-10-06 DIAGNOSIS — K029 Dental caries, unspecified: Secondary | ICD-10-CM | POA: Insufficient documentation

## 2014-10-06 MED ORDER — IBUPROFEN 800 MG PO TABS
800.0000 mg | ORAL_TABLET | Freq: Once | ORAL | Status: AC
  Administered 2014-10-06: 800 mg via ORAL
  Filled 2014-10-06: qty 1

## 2014-10-06 MED ORDER — TRAMADOL HCL 50 MG PO TABS: 50.0000 mg | ORAL_TABLET | Freq: Four times a day (QID) | ORAL | Status: DC | PRN

## 2014-10-06 MED ORDER — IBUPROFEN 800 MG PO TABS: 800.0000 mg | ORAL_TABLET | Freq: Three times a day (TID) | ORAL | Status: DC | PRN

## 2014-10-06 MED ORDER — AMOXICILLIN 500 MG PO CAPS: 500.0000 mg | ORAL_CAPSULE | Freq: Three times a day (TID) | ORAL | Status: DC

## 2014-10-06 MED ORDER — TRAMADOL HCL 50 MG PO TABS
50.0000 mg | ORAL_TABLET | Freq: Once | ORAL | Status: AC
  Administered 2014-10-06: 50 mg via ORAL
  Filled 2014-10-06: qty 1

## 2014-10-06 NOTE — ED Provider Notes (Signed)
[facility] Emergency Department Provider Note  ____________________________________________  Time seen: Approximately 4:18 PM  I have reviewed the triage vital signs and the nursing notes.   HISTORY  Chief Complaint Abscess and Dental Pain    HPI Tammy Frost is a 34 y.o. female patient complaining of 1 week of right lower molar dental pain. Patient has a history of devitalized denture. Patient denies any fever. Patient stated the gum of the affected tooth is swollen. Patient states she's been taken over-the-counter ibuprofen with only mild relief. Patient states she has a list of dental clinics given to her at the front desk upon checking. Patient is rating her pain as a 5/10. Patient described as pain dull to sharp.   No past medical history on file.  There are no active problems to display for this patient.   Past Surgical History  Procedure Laterality Date  . Cesarean section  12/27/2011    Current Outpatient Rx  Name  Route  Sig  Dispense  Refill  . acetaminophen (TYLENOL) 500 MG tablet   Oral   Take 1,000 mg by mouth every 6 (six) hours as needed for headache.         Marland Kitchen amoxicillin (AMOXIL) 500 MG capsule   Oral   Take 1 capsule (500 mg total) by mouth 3 (three) times daily.   30 capsule   0   . ibuprofen (ADVIL,MOTRIN) 200 MG tablet   Oral   Take 200 mg by mouth every 6 (six) hours as needed.         Marland Kitchen ibuprofen (ADVIL,MOTRIN) 800 MG tablet   Oral   Take 1 tablet (800 mg total) by mouth every 8 (eight) hours as needed for moderate pain.   15 tablet   0   . meloxicam (MOBIC) 7.5 MG tablet   Oral   Take 2 tablets (15 mg total) by mouth daily.   30 tablet   0   . ondansetron (ZOFRAN ODT) 4 MG disintegrating tablet   Oral   Take 1 tablet (4 mg total) by mouth every 6 (six) hours as needed for nausea or vomiting.   20 tablet   0   . predniSONE (DELTASONE) 20 MG tablet   Oral   Take 2 tablets (40 mg total) by mouth  daily.   10 tablet   0   . traMADol (ULTRAM) 50 MG tablet   Oral   Take 1 tablet (50 mg total) by mouth every 6 (six) hours as needed.   20 tablet   0     Allergies Flagyl and Sulfa antibiotics  No family history on file.  Social History History  Substance Use Topics  . Smoking status: Never Smoker   . Smokeless tobacco: Not on file  . Alcohol Use: No    Review of Systems Constitutional: No fever/chills Eyes: No visual changes. ENT: No sore throat. Right lower dental pain.  Cardiovascular: Denies chest pain. Respiratory: Denies shortness of breath. Gastrointestinal: No abdominal pain.  No nausea, no vomiting.  No diarrhea.  No constipation. Genitourinary: Negative for dysuria. Musculoskeletal: Negative for back pain. Skin: Negative for rash. Neurological: Negative for headaches, focal weakness or numbness. Allergic/Immunilogical: See medication list 10-point ROS otherwise negative.  ____________________________________________   PHYSICAL EXAM:  VITAL SIGNS: ED Triage Vitals  Enc Vitals Group     BP 10/06/14 1556 143/72 mmHg     Pulse Rate 10/06/14 1556 87     Resp 10/06/14 1556 18  Temp 10/06/14 1556 98.5 F (36.9 C)     Temp Source 10/06/14 1556 Oral     SpO2 10/06/14 1556 99 %     Weight 10/06/14 1556 183 lb (83.008 kg)     Height 10/06/14 1556 5\' 1"  (1.549 m)     Head Cir --      Peak Flow --      Pain Score 10/06/14 1557 5     Pain Loc --      Pain Edu? --      Excl. in GC? --     Constitutional: Alert and oriented. Well appearing and in no acute distress. Eyes: Conjunctivae are normal. PERRL. EOMI. Head: Atraumatic. Nose: No congestion/rhinnorhea. Mouth/Throat: Mucous membranes are moist.  Oropharynx non-erythematous. Fractured tooth #29 and 30. Multiple caries visible. Neck: No stridor. No cervical spine tenderness to palpation. Hematological/Lymphatic/Immunilogical: No cervical lymphadenopathy. Cardiovascular: Normal rate, regular  rhythm. Grossly normal heart sounds.  Good peripheral circulation. Respiratory: Normal respiratory effort.  No retractions. Lungs CTAB. Gastrointestinal: Soft and nontender. No distention. No abdominal bruits. No CVA tenderness. Musculoskeletal: No lower extremity tenderness nor edema.  No joint effusions. Neurologic:  Normal speech and language. No gross focal neurologic deficits are appreciated. No gait instability. Skin:  Skin is warm, dry and intact. No rash noted. Psychiatric: Mood and affect are normal. Speech and behavior are normal.  ____________________________________________   LABS (all labs ordered are listed, but only abnormal results are displayed)  Labs Reviewed - No data to display ____________________________________________  EKG  ____________________________________________  RADIOLOGY   ____________________________________________   PROCEDURES  Procedure(s) performed: None  Critical Care performed: No  ____________________________________________   INITIAL IMPRESSION / ASSESSMENT AND PLAN / ED COURSE  Pertinent labs & imaging results that were available during my care of the patient were reviewed by me and considered in my medical decision making (see chart for details).  Dental pain secondary to fractured teeth. Multiple caries throughout oral cavity. Patient advised to follow-up with the walk-in dental clinic on Tuesday morning. Patient given a prescription for ibuprofen 3 day supply of tramadol and also prescription for amoxicillin. Patient advised return by ER for condition worsens.  OPTIONS FOR DENTAL FOLLOW UP CARE  [place] Department of Health and Human Services - Local Safety Net Dental Clinics [email or web address]   [facility] [phone])  [facility] [phone])  [place] [phone] ext [phone])  [facility] [phone])  [facility]  Clinic [phone]) This clinic caters to the indigent population and is on a lottery system. Location: [employer] of Dentistry, [employer], [address], [place] Clinic Hours: Wednesdays from 6pm - 9pm, patients seen by a lottery system. For dates, call or go to [email or web address] Services: Cleanings, fillings and simple extractions. Payment Options: DENTAL WORK IS FREE OF CHARGE. Bring proof of income or support. Best way to get seen: Arrive at 5:15 pm - this is a lottery, NOT first come/first serve, so arriving earlier will not increase your chances of being seen.     [facility] [phone] Select option 1 for emergencies   Location: [facility] of Dentistry, [place], [address], [place] Clinic Hours: No walk-ins accepted - call the day before to schedule an appointment. Check in times are 9:30 am and 1:30 pm. Services: Simple extractions, temporary fillings, pulpectomy/pulp debridement, uncomplicated abscess drainage. Payment Options: PAYMENT IS DUE AT THE TIME OF SERVICE.  Fee is usually $100-200, additional surgical procedures (e.g.  abscess drainage) may be extra. Cash, checks, Visa/MasterCard accepted.  Can file Medicaid if patient is covered for dental - patient should call case worker to check. No discount for [facility] patients. Best way to get seen: MUST call the day before and get onto the schedule. Can usually be seen the next 1-2 days. No walk-ins accepted.     [facility] [phone]   Location: [facility], [address], [place] Clinic Hours: M, W, Th, F 8am or 1:30pm, Tues 9a or 1:30 - first come/first served. Services: Simple extractions, temporary fillings, uncomplicated abscess drainage.  You do not need to be an [facility] resident. Payment Options: PAYMENT IS DUE AT THE TIME OF SERVICE. Dental insurance, otherwise sliding scale  - bring proof of income or support. Depending on income and treatment needed, cost is usually $50-200. Best way to get seen: Arrive early as it is first come/first served.     [facility] [phone]   Location: [address] Clinic Hours: Mon-Thu 8a-5p Services: Most basic dental services including extractions and fillings. Payment Options: PAYMENT IS DUE AT THE TIME OF SERVICE. Sliding scale, up to 50% off - bring proof if income or support. Medicaid with dental option accepted. Best way to get seen: Call to schedule an appointment, can usually be seen within 2 weeks OR they will try to see walk-ins - show up at 8a or 2p (you may have to wait).     [facility] [phone] [place] RESIDENTS ONLY   Location: [facility], [address], [place], [place] [address] Clinic Hours: By appointment only. Monday - Thursday 8am-5pm, Friday 8am-12pm Services: Cleanings, fillings, extractions. Payment Options: PAYMENT IS DUE AT THE TIME OF SERVICE. Cash, Visa or MasterCard. Sliding scale - $30 minimum per service. Best way to get seen: Come in to office, complete packet and make an appointment - need proof of income or support monies for each household member and proof of [facility] residence. Usually takes about a month to get in.     [facility] [phone]   Location: [address], [facility] Clinic Hours: Walk-in Urgent Care Dental Services are offered Monday-Friday mornings only. The numbers of emergencies accepted daily is limited to the number of providers available. Maximum 15 - Mondays, Wednesdays & Thursdays Maximum 10 - Tuesdays & Fridays Services: You do not need to be a [facility] resident to be seen for a dental emergency. Emergencies are defined as pain, swelling, abnormal bleeding, or dental trauma. Walkins will receive x-rays if  needed. NOTE: Dental cleaning is not an emergency. Payment Options: PAYMENT IS DUE AT THE TIME OF SERVICE. Minimum co-pay is $40.00 for uninsured patients. Minimum co-pay is $3.00 for Medicaid with dental coverage. Dental Insurance is accepted and must be presented at time of visit. Medicare does not cover dental. Forms of payment: Cash, credit card, checks. Best way to get seen: If not previously registered with the clinic, walk-in dental registration begins at 7:15 am and is on a first come/first serve basis. If previously registered with the clinic, call to make an appointment.     [facility] [phone] [place] RESIDENTS ONLY   Location: [address] [place], [place] Clinic Hours: Mon-Thu 10a-2p Services: Extractions only! Payment Options: FREE (donations accepted) - bring proof of income or support Best way to get seen: Call and schedule an appointment OR come at 8am  on the 1st Monday of every month (except for holidays) when it is first come/first served.     [facility] [phone]   Location: [address] [place], [place] Clinic Hours: Friday mornings Services, Payment Options, Best way to get seen: Call for info  ____________________________________________   FINAL CLINICAL IMPRESSION(S) / ED DIAGNOSES  Final diagnoses:  Pain due to dental caries      Joni Reining, PA-C 10/06/14 1631  Joni Reining, PA-C 10/06/14 1632  Emily Filbert, MD 10/06/14 [phone]

## 2014-10-06 NOTE — ED Notes (Signed)
Pt reports abscess on right lower mouth. Started about 1 weeks ago.  Pt feels that it is where she had a broken tooth.

## 2014-10-06 NOTE — ED Notes (Signed)
Dental pain for last week. Pain to lower right jaw.

## 2014-10-06 NOTE — Discharge Instructions (Signed)
OPTIONS FOR DENTAL FOLLOW UP CARE ° °[place] Department of Health and Human Services - Local Safety Net Dental Clinics °http://www.ncdhhs.gov/dph/oralhealth/services/safetynetclinics.htm °  °[facility] (336-562-3123) ° °[facility] [place] ([phone]) ° °[facility] ([phone]) ° °[facility] ([phone]) ° °[facility] ([phone]) °This clinic caters to the indigent population and is on a lottery system. °Location: °[facility], [address], [place] °Clinic Hours: °Wednesdays from 6pm - 9pm, patients seen by a lottery system. °For dates, call or go to www.med.[facility].edu/[facility]/patients/Dental-[facility] °Services: °Cleanings, fillings and simple extractions. °Payment Options: °DENTAL WORK IS FREE OF CHARGE. Bring proof of income or support. °Best way to get seen: °Arrive at 5:15 pm - this is a lottery, NOT first come/first serve, so arriving earlier will not increase your chances of being seen. °  °  °[facility] °[phone] °Select option 1 for emergencies °  °Location: °[facility], [address], [place] °Clinic Hours: °No walk-ins accepted - call the day before to schedule an appointment. °Check in times are 9:30 am and 1:30 pm. °Services: °Simple extractions, temporary fillings, pulpectomy/pulp debridement, uncomplicated abscess drainage. °Payment Options: °PAYMENT IS DUE AT THE TIME OF SERVICE.  Fee is usually $100-200, additional surgical procedures (e.g. abscess drainage) may be extra. °Cash, checks, Visa/MasterCard accepted.  Can file Medicaid if patient is covered for dental - patient should call case worker to check. °No discount for [facility] patients. °Best way to get seen: °MUST call the day before and get onto the schedule. Can usually be seen the next 1-2 days. No walk-ins accepted. °  °  °[facility] °[phone] °   °Location: °[facility], [address], [place] °Clinic Hours: °M, W, Th, F 8am or 1:30pm, Tues 9a or 1:30 - first come/first served. °Services: °Simple extractions, temporary fillings, uncomplicated abscess drainage.  You do not need to be an [place] resident. °Payment Options: °PAYMENT IS DUE AT THE TIME OF SERVICE. °Dental insurance, otherwise sliding scale - bring proof of income or support. °Depending on income and treatment needed, cost is usually $50-200. °Best way to get seen: °Arrive early as it is first come/first served. °  °  °[facility] °[phone] °  °Location: °[address] °Clinic Hours: °Mon-Thu 8a-5p °Services: °Most basic dental services including extractions and fillings. °Payment Options: °PAYMENT IS DUE AT THE TIME OF SERVICE. °Sliding scale, up to 50% off - bring proof if income or support. °Medicaid with dental option accepted. °Best way to get seen: °Call to schedule an appointment, can usually be seen within 2 weeks OR they will try to see walk-ins - show up at 8a or 2p (you may have to wait). °  °  °[facility] °[phone] °[place] RESIDENTS ONLY °  °Location: °[facility], [address], [place],  [address] °Clinic Hours: By appointment only. °Monday - Thursday 8am-5pm, Friday 8am-12pm °Services: Cleanings, fillings, extractions. °Payment Options: °PAYMENT IS DUE AT THE TIME OF SERVICE. °Cash, Visa or MasterCard. Sliding scale - $30 minimum per service. °Best way to get seen: °Come in to office, complete packet and make an appointment - need proof of income °or support monies for each household member and proof of [place] residence. °Usually takes about a month to get in. °  °  °[facility] °[phone] °  °Location: °[address]   [place] [facility] Hours: Walk-in Urgent Care Dental Services are offered Monday-Friday  mornings only. The numbers of emergencies accepted daily is limited to the number of providers available. Maximum 15 - Mondays, Wednesdays & Thursdays Maximum 10 - Tuesdays & Fridays Services: You do not need to be a [facility] resident to be seen for a dental emergency. Emergencies are defined as pain, swelling, abnormal bleeding, or dental trauma. Walkins will receive x-rays if needed. NOTE: Dental cleaning is not an emergency. Payment Options: PAYMENT IS DUE AT THE TIME OF SERVICE. Minimum co-pay is $40.00 for uninsured patients. Minimum co-pay is $3.00 for Medicaid with dental coverage. Dental Insurance is accepted and must be presented at time of visit. Medicare does not cover dental. Forms of payment: Cash, credit card, checks. Best way to get seen: If not previously registered with the clinic, walk-in dental registration begins at 7:15 am and is on a first come/first serve basis. If previously registered with the clinic, call to make an appointment.     [facility] [phone] [place] RESIDENTS ONLY   Location: [address] [place], [place] Clinic Hours: Mon-Thu 10a-2p Services: Extractions only! Payment Options: FREE (donations accepted) - bring proof of income or support Best way to get seen: Call and schedule an appointment OR come at 8am on the 1st Monday of every month (except for holidays) when it is first come/first served.     [facility] [phone]   Location: [address] [place], [place] Clinic Hours: Friday mornings Services, Payment Options, Best way to get seen: Call for info  Dental Pain Toothache is pain in or around a tooth. It may get worse with chewing or with cold or heat.  HOME CARE  Your dentist may use a numbing medicine during treatment. If so, you may need to avoid eating until the medicine wears off. Ask your dentist about this.  Only take medicine as told by your dentist or doctor.  Avoid chewing food near  the painful tooth until after all treatment is done. Ask your dentist about this. GET HELP RIGHT AWAY IF:   The problem gets worse or new problems appear.  You have a fever.  There is redness and puffiness (swelling) of the face, jaw, or neck.  You cannot open your mouth.  There is pain in the jaw.  There is very bad pain that is not helped by medicine. MAKE SURE YOU:   Understand these instructions.  Will watch your condition.  Will get help right away if you are not doing well or get worse. Document Released: 08/18/2007 Document Revised: 05/24/2011 Document Reviewed: 08/18/2007 [facility] Patient Information 2015 [place], [place]. This information is not intended to replace advice given to you by your health care provider. Make sure you discuss any questions you have with your health care provider.  Dental Caries Dental caries (also called tooth decay) is the most common oral disease. It can occur at any age but is more common in children and young adults.  HOW DENTAL CARIES DEVELOPS  The process of decay begins when bacteria and foods (particularly sugars and starches) combine in your mouth to produce plaque. Plaque is a substance that sticks to the hard, outer surface of a tooth (enamel). The bacteria in plaque produce acids that attack enamel. These acids may also attack the root surface of a tooth (cementum) if it is exposed. Repeated attacks dissolve these surfaces and create holes in the tooth (cavities). If left untreated, the acids destroy  the other layers of the tooth.  RISK FACTORS  Frequent sipping of sugary beverages.   Frequent snacking on sugary and starchy foods, especially those that easily get stuck in the teeth.   Poor oral hygiene.   Dry mouth.   Substance abuse such as methamphetamine abuse.   Broken or poor-fitting dental restorations.   Eating disorders.   Gastroesophageal reflux disease (GERD).   Certain radiation treatments to the head and  neck. SYMPTOMS In the early stages of dental caries, symptoms are seldom present. Sometimes white, chalky areas may be seen on the enamel or other tooth layers. In later stages, symptoms may include:  Pits and holes on the enamel.  Toothache after sweet, hot, or cold foods or drinks are consumed.  Pain around the tooth.  Swelling around the tooth. DIAGNOSIS  Most of the time, dental caries is detected during a regular dental checkup. A diagnosis is made after a thorough medical and dental history is taken and the surfaces of your teeth are checked for signs of dental caries. Sometimes special instruments, such as lasers, are used to check for dental caries. Dental X-ray exams may be taken so that areas not visible to the eye (such as between the contact areas of the teeth) can be checked for cavities.  TREATMENT  If dental caries is in its early stages, it may be reversed with a fluoride treatment or an application of a remineralizing agent at the dental office. Thorough brushing and flossing at home is needed to aid these treatments. If it is in its later stages, treatment depends on the location and extent of tooth destruction:   If a small area of the tooth has been destroyed, the destroyed area will be removed and cavities will be filled with a material such as gold, silver amalgam, or composite resin.   If a large area of the tooth has been destroyed, the destroyed area will be removed and a cap (crown) will be fitted over the remaining tooth structure.   If the center part of the tooth (pulp) is affected, a procedure called a root canal will be needed before a filling or crown can be placed.   If most of the tooth has been destroyed, the tooth may need to be pulled (extracted). HOME CARE INSTRUCTIONS You can prevent, stop, or reverse dental caries at home by practicing good oral hygiene. Good oral hygiene includes:  Thoroughly cleaning your teeth at least twice a day with a  toothbrush and dental floss.   Using a fluoride toothpaste. A fluoride mouth rinse may also be used if recommended by your dentist or health care provider.   Restricting the amount of sugary and starchy foods and sugary liquids you consume.   Avoiding frequent snacking on these foods and sipping of these liquids.   Keeping regular visits with a dentist for checkups and cleanings. PREVENTION   Practice good oral hygiene.  Consider a dental sealant. A dental sealant is a coating material that is applied by your dentist to the pits and grooves of teeth. The sealant prevents food from being trapped in them. It may protect the teeth for several years.  Ask about fluoride supplements if you live in a community without fluorinated water or with water that has a low fluoride content. Use fluoride supplements as directed by your dentist or health care provider.  Allow fluoride varnish applications to teeth if directed by your dentist or health care provider. Document Released: 11/21/2001 Document Revised: 07/16/2013 Document  Reviewed: 03/03/2012 [employer] Patient Information 2015 [place], [place]. This information is not intended to replace advice given to you by your health care provider. Make sure you discuss any questions you have with your health care provider.

## 2015-03-12 ENCOUNTER — Emergency Department
Admission: EM | Admit: 2015-03-12 | Discharge: 2015-03-12 | Disposition: A | Discharge status: Home / Self Care | Payer: Medicaid Other | Attending: Emergency Medicine | Admitting: Emergency Medicine

## 2015-03-12 ENCOUNTER — Emergency Department: Payer: Medicaid Other

## 2015-03-12 DIAGNOSIS — M940 Chondrocostal junction syndrome [Tietze]: Secondary | ICD-10-CM | POA: Insufficient documentation

## 2015-03-12 DIAGNOSIS — Z792 Long term (current) use of antibiotics: Secondary | ICD-10-CM | POA: Insufficient documentation

## 2015-03-12 DIAGNOSIS — Z7952 Long term (current) use of systemic steroids: Secondary | ICD-10-CM | POA: Insufficient documentation

## 2015-03-12 DIAGNOSIS — Z79899 Other long term (current) drug therapy: Secondary | ICD-10-CM | POA: Insufficient documentation

## 2015-03-12 DIAGNOSIS — Z791 Long term (current) use of non-steroidal anti-inflammatories (NSAID): Secondary | ICD-10-CM | POA: Insufficient documentation

## 2015-03-12 LAB — CBC
HCT: 36.1 % (ref 35.0–47.0)
Hemoglobin: 12.2 g/dL (ref 12.0–16.0)
MCH: 27.6 pg (ref 26.0–34.0)
MCHC: 33.9 g/dL (ref 32.0–36.0)
MCV: 81.4 fL (ref 80.0–100.0)
Platelets: 199 10*3/uL (ref 150–440)
RBC: 4.43 MIL/uL (ref 3.80–5.20)
RDW: 13.1 % (ref 11.5–14.5)
WBC: 7.5 10*3/uL (ref 3.6–11.0)

## 2015-03-12 LAB — BASIC METABOLIC PANEL
Anion gap: 6 (ref 5–15)
BUN: 10 mg/dL (ref 6–20)
CALCIUM: 8.9 mg/dL (ref 8.9–10.3)
CO2: 25 mmol/L (ref 22–32)
CREATININE: 0.86 mg/dL (ref 0.44–1.00)
Chloride: 106 mmol/L (ref 101–111)
GFR calc Af Amer: >60 mL/min (ref >60)
GFR calc non Af Amer: >60 mL/min (ref >60)
Glucose, Bld: 127 mg/dL — ABNORMAL HIGH (ref 65–99)
Potassium: 3.6 mmol/L (ref 3.5–5.1)
Sodium: 137 mmol/L (ref 135–145)

## 2015-03-12 LAB — TROPONIN I: Troponin I: <0.03 ng/mL (ref <0.031)

## 2015-03-12 NOTE — ED Provider Notes (Signed)
[facility] Emergency Department Provider Note  ____________________________________________  Time seen: 5:50 AM  I have reviewed the triage vital signs and the nursing notes.   HISTORY  Chief Complaint Chest Pain    HPI Tammy Frost is a 34 y.o. female presents with central chest pain times "couple of days with radiation to back. Patient states that the pain is worse with movement. Patient denies any dyspnea no nausea no vomiting or diaphoresis. Patient denies any personal or family history of cardiac disease or pulmonary emboli/DVT. She denies any leg pain or swelling.    Past medical history None There are no active problems to display for this patient.   Past Surgical History  Procedure Laterality Date  . Cesarean section  12/27/2011    Current Outpatient Rx  Name  Route  Sig  Dispense  Refill  . acetaminophen (TYLENOL) 500 MG tablet   Oral   Take 1,000 mg by mouth every 6 (six) hours as needed for headache.         Marland Kitchen amoxicillin (AMOXIL) 500 MG capsule   Oral   Take 1 capsule (500 mg total) by mouth 3 (three) times daily.   30 capsule   0   . ibuprofen (ADVIL,MOTRIN) 200 MG tablet   Oral   Take 200 mg by mouth every 6 (six) hours as needed.         Marland Kitchen ibuprofen (ADVIL,MOTRIN) 800 MG tablet   Oral   Take 1 tablet (800 mg total) by mouth every 8 (eight) hours as needed for moderate pain.   15 tablet   0   . meloxicam (MOBIC) 7.5 MG tablet   Oral   Take 2 tablets (15 mg total) by mouth daily.   30 tablet   0   . ondansetron (ZOFRAN ODT) 4 MG disintegrating tablet   Oral   Take 1 tablet (4 mg total) by mouth every 6 (six) hours as needed for nausea or vomiting.   20 tablet   0   . predniSONE (DELTASONE) 20 MG tablet   Oral   Take 2 tablets (40 mg total) by mouth daily.   10 tablet   0   . traMADol (ULTRAM) 50 MG tablet   Oral   Take 1 tablet (50 mg total) by mouth every 6 (six) hours as needed.   20 tablet   0      Allergies Flagyl and Sulfa antibiotics  No family history on file.  Social History Social History  Substance Use Topics  . Smoking status: Never Smoker   . Smokeless tobacco: Not on file  . Alcohol Use: No    Review of Systems  Constitutional: Negative for fever. Eyes: Negative for visual changes. ENT: Negative for sore throat. Cardiovascular: Positive for chest pain Respiratory: Negative for shortness of breath. Gastrointestinal: Negative for abdominal pain, vomiting and diarrhea. Genitourinary: Negative for dysuria. Musculoskeletal: Negative for back pain. Skin: Negative for rash. Neurological: Negative for headaches, focal weakness or numbness.   10-point ROS otherwise negative.  ____________________________________________   PHYSICAL EXAM:  VITAL SIGNS: ED Triage Vitals  Enc Vitals Group     BP 03/12/15 0501 138/79 mmHg     Pulse Rate 03/12/15 0501 82     Resp 03/12/15 0501 18     Temp 03/12/15 0501 98.9 F (37.2 C)     Temp Source 03/12/15 0501 Oral     SpO2 03/12/15 0501 100 %     Weight 03/12/15 0501 184 lb (  83.462 kg)     Height 03/12/15 0501 5\' 1"  (1.549 m)     Head Cir --      Peak Flow --      Pain Score 03/12/15 0501 4     Pain Loc --      Pain Edu? --      Excl. in GC? --      Constitutional: Alert and oriented. Well appearing and in no distress. Eyes: Conjunctivae are normal. PERRL. Normal extraocular movements. ENT   Head: Normocephalic and atraumatic.   Nose: No congestion/rhinnorhea.   Mouth/Throat: Mucous membranes are moist.   Neck: No stridor. Hematological/Lymphatic/Immunilogical: No cervical lymphadenopathy. Cardiovascular: Normal rate, regular rhythm. Normal and symmetric distal pulses are present in all extremities. No murmurs, rubs, or gallops. Pain with Palpation costosternal joints Respiratory: Normal respiratory effort without tachypnea nor retractions. Breath sounds are clear and equal bilaterally. No  wheezes/rales/rhonchi. Gastrointestinal: Soft and nontender. No distention. There is no CVA tenderness. Genitourinary: deferred Musculoskeletal: Nontender with normal range of motion in all extremities. No joint effusions.  No lower extremity tenderness nor edema. Neurologic:  Normal speech and language. No gross focal neurologic deficits are appreciated. Speech is normal.  Skin:  Skin is warm, dry and intact. No rash noted. Psychiatric: Mood and affect are normal. Speech and behavior are normal. Patient exhibits appropriate insight and judgment.  ____________________________________________    LABS (pertinent positives/negatives)  Labs Reviewed  BASIC METABOLIC PANEL - Abnormal; Notable for the following:    Glucose, Bld 127 (*)    All other components within normal limits  CBC  TROPONIN I     ____________________________________________   EKG  ED ECG REPORT I, BROWN, [place] N, the attending physician, personally viewed and interpreted this ECG.   Date: 03/13/2015  EKG Time: 5:09 AM  Rate: 82  Rhythm:  Normal sinus rhythm  Axis: None  Intervals: Normal  ST&T Change: None   ____________________________________________    RADIOLOGY    DG Chest Portable 1 View (Final result) Result time: 03/12/15 07:06:01   Final result by Rad Results In Interface (03/12/15 07:06:01)   Narrative:   CLINICAL DATA: Two day history of chest pain radiating toward back  EXAM: PORTABLE CHEST 1 VIEW  COMPARISON: [place] 8, 2012  FINDINGS: There is no edema or consolidation. The heart size and pulmonary vascularity are normal. No adenopathy. No pneumothorax. No bone lesions.  IMPRESSION: No edema or consolidation.   Electronically Signed By: Bretta BangWilliam Woodruff III M.D. On: 03/12/2015 07:06         INITIAL IMPRESSION / ASSESSMENT AND PLAN / ED COURSE  Pertinent labs & imaging results that were available during my care of the patient were reviewed by me and  considered in my medical decision making (see chart for details).    ____________________________________________   FINAL CLINICAL IMPRESSION(S) / ED DIAGNOSES  Final diagnoses:  Costochondritis, acute      Darci Currentandolph N Brown, MD 03/13/15 [phone]

## 2015-03-12 NOTE — ED Notes (Signed)
Pt in with co chest and back pain x 2 days, worse when she walks or lays down.

## 2015-03-12 NOTE — Discharge Instructions (Signed)

## 2015-03-12 NOTE — ED Notes (Signed)
Pt c/o central chest pain x couple days with back pain. Denies any SOB, nausea. Pt NSR on monitor

## 2015-05-13 ENCOUNTER — Encounter: Payer: Self-pay | Admitting: Emergency Medicine

## 2015-05-13 ENCOUNTER — Emergency Department
Admission: EM | Admit: 2015-05-13 | Discharge: 2015-05-13 | Disposition: A | Discharge status: Home / Self Care | Payer: Medicaid Other | Attending: Emergency Medicine | Admitting: Emergency Medicine

## 2015-05-13 DIAGNOSIS — Z791 Long term (current) use of non-steroidal anti-inflammatories (NSAID): Secondary | ICD-10-CM | POA: Insufficient documentation

## 2015-05-13 DIAGNOSIS — J069 Acute upper respiratory infection, unspecified: Secondary | ICD-10-CM | POA: Insufficient documentation

## 2015-05-13 DIAGNOSIS — Z792 Long term (current) use of antibiotics: Secondary | ICD-10-CM | POA: Insufficient documentation

## 2015-05-13 DIAGNOSIS — B9789 Other viral agents as the cause of diseases classified elsewhere: Secondary | ICD-10-CM

## 2015-05-13 DIAGNOSIS — Z79899 Other long term (current) drug therapy: Secondary | ICD-10-CM | POA: Insufficient documentation

## 2015-05-13 DIAGNOSIS — J988 Other specified respiratory disorders: Secondary | ICD-10-CM

## 2015-05-13 LAB — RAPID INFLUENZA A&B ANTIGENS
Influenza A (ARMC): NOT DETECTED — AB
Influenza B (ARMC): NOT DETECTED — AB

## 2015-05-13 MED ORDER — KETOROLAC TROMETHAMINE 60 MG/2ML IM SOLN
60.0000 mg | Freq: Once | INTRAMUSCULAR | Status: AC
  Administered 2015-05-13: 60 mg via INTRAMUSCULAR
  Filled 2015-05-13: qty 2

## 2015-05-13 MED ORDER — IBUPROFEN 800 MG PO TABS: 800.0000 mg | ORAL_TABLET | Freq: Three times a day (TID) | ORAL | Status: DC | PRN

## 2015-05-13 MED ORDER — PSEUDOEPH-BROMPHEN-DM 30-2-10 MG/5ML PO SYRP: 5.0000 mL | ORAL_SOLUTION | Freq: Four times a day (QID) | ORAL | Status: DC | PRN

## 2015-05-13 NOTE — ED Notes (Signed)
States she developed cough sore throat and body aches with fever/chills about 3 days ago

## 2015-05-13 NOTE — Discharge Instructions (Signed)

## 2015-05-13 NOTE — ED Notes (Signed)
Pt states she thought she had a sinus infection, now cough with occasional sob.

## 2015-05-13 NOTE — ED Provider Notes (Signed)
[facility] Emergency Department Provider Note  ____________________________________________  Time seen: Approximately 10:08 AM  I have reviewed the triage vital signs and the nursing notes.   HISTORY  Chief Complaint Cough; Back Pain; and Shortness of Breath    HPI Tammy Frost is a 35 y.o. female patient complain of sinus congestion cough sore throat and body aches for 3 days. Patient also states she's having fever and chills.Patient states nausea but no vomiting or diarrhea. No palliative measures taken for this complaint. Patient rates the pain discomfort as a 6/10. Patient described her pain as "aching". Patient has not taken flu immunizations this season. History reviewed. No pertinent past medical history.  There are no active problems to display for this patient.   Past Surgical History  Procedure Laterality Date  . Cesarean section  12/27/2011  . Cesarean section      Current Outpatient Rx  Name  Route  Sig  Dispense  Refill  . acetaminophen (TYLENOL) 500 MG tablet   Oral   Take 1,000 mg by mouth every 6 (six) hours as needed for headache.         Marland Kitchen amoxicillin (AMOXIL) 500 MG capsule   Oral   Take 1 capsule (500 mg total) by mouth 3 (three) times daily.   30 capsule   0   . brompheniramine-pseudoephedrine-DM 30-2-10 MG/5ML syrup   Oral   Take 5 mLs by mouth 4 (four) times daily as needed.   120 mL   0   . ibuprofen (ADVIL,MOTRIN) 200 MG tablet   Oral   Take 200 mg by mouth every 6 (six) hours as needed.         Marland Kitchen ibuprofen (ADVIL,MOTRIN) 800 MG tablet   Oral   Take 1 tablet (800 mg total) by mouth every 8 (eight) hours as needed for moderate pain.   15 tablet   0   . ibuprofen (ADVIL,MOTRIN) 800 MG tablet   Oral   Take 1 tablet (800 mg total) by mouth every 8 (eight) hours as needed.   30 tablet   0   . meloxicam (MOBIC) 7.5 MG tablet   Oral   Take 2 tablets (15 mg total) by mouth daily.   30 tablet   0   .  ondansetron (ZOFRAN ODT) 4 MG disintegrating tablet   Oral   Take 1 tablet (4 mg total) by mouth every 6 (six) hours as needed for nausea or vomiting.   20 tablet   0   . predniSONE (DELTASONE) 20 MG tablet   Oral   Take 2 tablets (40 mg total) by mouth daily. Patient not taking: Reported on 03/12/2015   10 tablet   0   . traMADol (ULTRAM) 50 MG tablet   Oral   Take 1 tablet (50 mg total) by mouth every 6 (six) hours as needed.   20 tablet   0     Allergies Flagyl and Sulfa antibiotics  No family history on file.  Social History Social History  Substance Use Topics  . Smoking status: Never Smoker   . Smokeless tobacco: None  . Alcohol Use: No    Review of Systems Constitutional: Fever chills and body aches Eyes: No visual changes. ENT: No sore throat. Intermittent nasal congestion and runny nose. Cardiovascular: Denies chest pain. Respiratory: Denies shortness of breath. Nonproductive cough Gastrointestinal: No abdominal pain.  No nausea, no vomiting.  No diarrhea.  No constipation. Genitourinary: Negative for dysuria. Musculoskeletal: Negative for back  pain. Skin: Negative for rash. Neurological: Negative for headaches, focal weakness or numbness. Allergic/Immunilogical: Flagyl and sulfa antibiotics. 10-point ROS otherwise negative.  ____________________________________________   PHYSICAL EXAM:  VITAL SIGNS: ED Triage Vitals  Enc Vitals Group     BP 05/13/15 0951 127/83 mmHg     Pulse Rate 05/13/15 0951 78     Resp 05/13/15 0951 18     Temp 05/13/15 0951 98.2 F (36.8 C)     Temp Source 05/13/15 0951 Oral     SpO2 05/13/15 0951 100 %     Weight 05/13/15 0948 170 lb (77.111 kg)     Height 05/13/15 0948  (1.549 m)     Head Cir --      Peak Flow --      Pain Score 05/13/15 0948 6     Pain Loc --      Pain Edu? --      Excl. in GC? --     Constitutional: Alert and oriented. Well appearing and in no acute distress. Eyes: Conjunctivae are  normal. PERRL. EOMI. Head: Atraumatic. Nose: Bilateral maxillary guarding with edematous nasal turbinates. Clear rhinorrhea. Mouth/Throat: Mucous membranes are moist.  Oropharynx non-erythematous. Neck: No stridor.  No cervical spine tenderness to palpation. Hematological/Lymphatic/Immunilogical: No cervical lymphadenopathy. Cardiovascular: Normal rate, regular rhythm. Grossly normal heart sounds.  Good peripheral circulation. Respiratory: Normal respiratory effort.  No retractions. Lungs CTAB. Nonproductive cough with deep inspirations. Gastrointestinal: Soft and nontender. No distention. No abdominal bruits. No CVA tenderness. Musculoskeletal: No lower extremity tenderness nor edema.  No joint effusions. Neurologic:  Normal speech and language. No gross focal neurologic deficits are appreciated. No gait instability. Skin:  Skin is warm, dry and intact. No rash noted. Psychiatric: Mood and affect are normal. Speech and behavior are normal.  ____________________________________________   LABS (all labs ordered are listed, but only abnormal results are displayed)  Labs Reviewed  RAPID INFLUENZA A&B ANTIGENS ([facility] ONLY) - Abnormal; Notable for the following:    Influenza A [facility]) NOT DETECTED (*)    Influenza B ([facility]) NOT DETECTED (*)    All other components within normal limits   ____________________________________________  EKG   ____________________________________________  RADIOLOGY   ____________________________________________   PROCEDURES  Procedure(s) performed: None  Critical Care performed: No  ____________________________________________   INITIAL IMPRESSION / ASSESSMENT AND PLAN / ED COURSE  Pertinent labs & imaging results that were available during my care of the patient were reviewed by me and considered in my medical decision making (see chart for details).  Viral illness. Discussed negative for flu results with patient. Patient given discharge care  information. Patient given a prescription for Bromfed-DM and ibuprofen. She given a work note for 2 days. ____________________________________________   FINAL CLINICAL IMPRESSION(S) / ED DIAGNOSES  Final diagnoses:  Viral respiratory illness      Joni Reining, PA-C 05/13/15 1112  Arnaldo Natal, MD 05/13/15 [phone]

## 2015-05-22 ENCOUNTER — Encounter: Payer: Self-pay | Admitting: *Deleted

## 2015-05-22 ENCOUNTER — Emergency Department
Admission: EM | Admit: 2015-05-22 | Discharge: 2015-05-22 | Disposition: A | Discharge status: Home / Self Care | Payer: Medicaid Other | Attending: Emergency Medicine | Admitting: Emergency Medicine

## 2015-05-22 DIAGNOSIS — X58XXXA Exposure to other specified factors, initial encounter: Secondary | ICD-10-CM | POA: Insufficient documentation

## 2015-05-22 DIAGNOSIS — Y9389 Activity, other specified: Secondary | ICD-10-CM | POA: Insufficient documentation

## 2015-05-22 DIAGNOSIS — Y9289 Other specified places as the place of occurrence of the external cause: Secondary | ICD-10-CM | POA: Insufficient documentation

## 2015-05-22 DIAGNOSIS — S46912A Strain of unspecified muscle, fascia and tendon at shoulder and upper arm level, left arm, initial encounter: Secondary | ICD-10-CM | POA: Insufficient documentation

## 2015-05-22 DIAGNOSIS — Z792 Long term (current) use of antibiotics: Secondary | ICD-10-CM | POA: Insufficient documentation

## 2015-05-22 DIAGNOSIS — Z791 Long term (current) use of non-steroidal anti-inflammatories (NSAID): Secondary | ICD-10-CM | POA: Insufficient documentation

## 2015-05-22 DIAGNOSIS — Z79899 Other long term (current) drug therapy: Secondary | ICD-10-CM | POA: Insufficient documentation

## 2015-05-22 DIAGNOSIS — Y998 Other external cause status: Secondary | ICD-10-CM | POA: Insufficient documentation

## 2015-05-22 MED ORDER — METHOCARBAMOL 750 MG PO TABS: 750.0000 mg | ORAL_TABLET | Freq: Four times a day (QID) | ORAL | Status: DC

## 2015-05-22 MED ORDER — IBUPROFEN 800 MG PO TABS: 800.0000 mg | ORAL_TABLET | Freq: Three times a day (TID) | ORAL | Status: DC | PRN

## 2015-05-22 MED ORDER — KETOROLAC TROMETHAMINE 60 MG/2ML IM SOLN
60.0000 mg | Freq: Once | INTRAMUSCULAR | Status: AC
  Administered 2015-05-22: 60 mg via INTRAMUSCULAR
  Filled 2015-05-22: qty 2

## 2015-05-22 MED ORDER — TRAMADOL HCL 50 MG PO TABS: 50.0000 mg | ORAL_TABLET | Freq: Four times a day (QID) | ORAL | Status: DC | PRN

## 2015-05-22 NOTE — Discharge Instructions (Signed)
Muscle Strain °A muscle strain (pulled muscle) happens when a muscle is stretched beyond normal length. It happens when a sudden, violent force stretches your muscle too far. Usually, a few of the fibers in your muscle are torn. Muscle strain is common in athletes. Recovery usually takes 1-2 weeks. Complete healing takes 5-6 weeks.  °HOME CARE  °· Follow the PRICE method of treatment to help your injury get better. Do this the first 2-3 days after the injury: °¨ Protect. Protect the muscle to keep it from getting injured again. °¨ Rest. Limit your activity and rest the injured body part. °¨ Ice. Put ice in a plastic bag. Place a towel between your skin and the bag. Then, apply the ice and leave it on from 15-20 minutes each hour. After the third day, switch to moist heat packs. °¨ Compression. Use a splint or elastic bandage on the injured area for comfort. Do not put it on too tightly. °¨ Elevate. Keep the injured body part above the level of your heart. °· Only take medicine as told by your doctor. °· Warm up before doing exercise to prevent future muscle strains. °GET HELP IF:  °· You have more pain or puffiness (swelling) in the injured area. °· You feel numbness, tingling, or notice a loss of strength in the injured area. °MAKE SURE YOU:  °· Understand these instructions. °· Will watch your condition. °· Will get help right away if you are not doing well or get worse. °  °This information is not intended to replace advice given to you by your health care provider. Make sure you discuss any questions you have with your health care provider. °  °Document Released: 12/09/2007 Document Revised: 12/20/2012 Document Reviewed: 09/28/2012 °Elsevier Interactive Patient Education ©2016 Elsevier Inc. ° °

## 2015-05-22 NOTE — ED Notes (Signed)
Developed pain to left upper back  , below shoulder blade.  Describes pain as "burning"type pain   Increased pain with movement and touch

## 2015-05-22 NOTE — ED Notes (Signed)
Pt reports neck and back pain starting yesterday, pt denies injury

## 2015-05-22 NOTE — ED Provider Notes (Signed)
[facility] Emergency Department Provider Note  ____________________________________________  Time seen: Approximately 9:26 AM  I have reviewed the triage vital signs and the nursing notes.   HISTORY  Chief Complaint Neck Pain and Back Pain    HPI Tammy Frost is a 35 y.o. female patient states pain below the left shoulder blade onset yesterday. Patient described the pain as "burning". Patient stated the pain increase with movement of the shoulder and also with palpation of the area. Patient denies any provocative incident for this complaint. No palliative measures taken for this complaint. Patient is right-hand dominant. Patient stated decreased range of motion of the shoulder secondary to complain of pain. Patient denies any loss of sensation.  History reviewed. No pertinent past medical history.  There are no active problems to display for this patient.   Past Surgical History  Procedure Laterality Date  . Cesarean section  12/27/2011  . Cesarean section      Current Outpatient Rx  Name  Route  Sig  Dispense  Refill  . acetaminophen (TYLENOL) 500 MG tablet   Oral   Take 1,000 mg by mouth every 6 (six) hours as needed for headache.         Marland Kitchen. amoxicillin (AMOXIL) 500 MG capsule   Oral   Take 1 capsule (500 mg total) by mouth 3 (three) times daily.   30 capsule   0   . brompheniramine-pseudoephedrine-DM 30-2-10 MG/5ML syrup   Oral   Take 5 mLs by mouth 4 (four) times daily as needed.   120 mL   0   . ibuprofen (ADVIL,MOTRIN) 200 MG tablet   Oral   Take 200 mg by mouth every 6 (six) hours as needed.         Marland Kitchen. ibuprofen (ADVIL,MOTRIN) 800 MG tablet   Oral   Take 1 tablet (800 mg total) by mouth every 8 (eight) hours as needed for moderate pain.   15 tablet   0   . ibuprofen (ADVIL,MOTRIN) 800 MG tablet   Oral   Take 1 tablet (800 mg total) by mouth every 8 (eight) hours as needed.   30 tablet   0   . ibuprofen (ADVIL,MOTRIN) 800  MG tablet   Oral   Take 1 tablet (800 mg total) by mouth every 8 (eight) hours as needed for moderate pain.   15 tablet   0   . meloxicam (MOBIC) 7.5 MG tablet   Oral   Take 2 tablets (15 mg total) by mouth daily.   30 tablet   0   . methocarbamol (ROBAXIN-750) 750 MG tablet   Oral   Take 1 tablet (750 mg total) by mouth 4 (four) times daily.   20 tablet   0   . ondansetron (ZOFRAN ODT) 4 MG disintegrating tablet   Oral   Take 1 tablet (4 mg total) by mouth every 6 (six) hours as needed for nausea or vomiting.   20 tablet   0   . predniSONE (DELTASONE) 20 MG tablet   Oral   Take 2 tablets (40 mg total) by mouth daily. Patient not taking: Reported on 03/12/2015   10 tablet   0   . traMADol (ULTRAM) 50 MG tablet   Oral   Take 1 tablet (50 mg total) by mouth every 6 (six) hours as needed.   20 tablet   0   . traMADol (ULTRAM) 50 MG tablet   Oral   Take 1 tablet (50 mg total)  by mouth every 6 (six) hours as needed for moderate pain.   12 tablet   0     Allergies Flagyl and Sulfa antibiotics  No family history on file.  Social History Social History  Substance Use Topics  . Smoking status: Never Smoker   . Smokeless tobacco: None  . Alcohol Use: No    Review of Systems Constitutional: No fever/chills Eyes: No visual changes. ENT: No sore throat. Cardiovascular: Denies chest pain. Respiratory: Denies shortness of breath. Gastrointestinal: No abdominal pain.  No nausea, no vomiting.  No diarrhea.  No constipation. Genitourinary: Negative for dysuria. Musculoskeletal: Left scapular area pain Skin: Negative for rash. Neurological: Negative for headaches, focal weakness or numbness. Allergic/Immunilogical: Sulfa and Flagyl antibiotics  10-point ROS otherwise negative.  ____________________________________________   PHYSICAL EXAM:  VITAL SIGNS: ED Triage Vitals  Enc Vitals Group     BP 05/22/15 0839 126/78 mmHg     Pulse Rate 05/22/15 0839 95      Resp 05/22/15 0839 16     Temp 05/22/15 0839 98.2 F (36.8 C)     Temp Source 05/22/15 0839 Oral     SpO2 05/22/15 0839 99 %     Weight 05/22/15 0839 171 lb (77.565 kg)     Height 05/22/15 0839  (1.549 m)     Head Cir --      Peak Flow --      Pain Score 05/22/15 0842 7     Pain Loc --      Pain Edu? --      Excl. in GC? --     Constitutional: Alert and oriented. Well appearing and in no acute distress. Eyes: Conjunctivae are normal. PERRL. EOMI. Head: Atraumatic. Nose: No congestion/rhinnorhea. Mouth/Throat: Mucous membranes are moist.  Oropharynx non-erythematous. Neck: No stridor. No cervical spine tenderness to palpation. Hematological/Lymphatic/Immunilogical: No cervical lymphadenopathy. Cardiovascular: Normal rate, regular rhythm. Grossly normal heart sounds.  Good peripheral circulation. Respiratory: Normal respiratory effort.  No retractions. Lungs CTAB. Gastrointestinal: Soft and nontender. No distention. No abdominal bruits. No CVA tenderness. Musculoskeletal: No obvious deformity. Patient has some moderate guarding palpation of the mid scapular muscle group. Patient has decreased range of motion with abduction and overhead reaching secondary to complain of pain. Neurologic:  Normal speech and language. No gross focal neurologic deficits are appreciated. No gait instability. Skin:  Skin is warm, dry and intact. No rash noted. Psychiatric: Mood and affect are normal. Speech and behavior are normal.  ____________________________________________   LABS (all labs ordered are listed, but only abnormal results are displayed)  Labs Reviewed - No data to display ____________________________________________  EKG   ____________________________________________  RADIOLOGY   ____________________________________________   PROCEDURES  Procedure(s) performed: None  Critical Care performed: No  ____________________________________________   INITIAL IMPRESSION /  ASSESSMENT AND PLAN / ED COURSE  Pertinent labs & imaging results that were available during my care of the patient were reviewed by me and considered in my medical decision making (see chart for details).  Left scapular muscle strain. She gave discharged care instructions. Patient given a prescription for Robaxin and naproxen. Patient advised to follow-up family doctor if condition persists. ____________________________________________   FINAL CLINICAL IMPRESSION(S) / ED DIAGNOSES  Final diagnoses:  Muscle strain of scapular region, left, initial encounter      Joni Reining, PA-C 05/22/15 0940  Myrna Blazer, MD 05/24/15 2059

## 2015-07-20 ENCOUNTER — Emergency Department
Admission: EM | Admit: 2015-07-20 | Discharge: 2015-07-20 | Disposition: A | Discharge status: Home / Self Care | Payer: Self-pay | Attending: Emergency Medicine | Admitting: Emergency Medicine

## 2015-07-20 ENCOUNTER — Emergency Department: Payer: Self-pay

## 2015-07-20 DIAGNOSIS — K8020 Calculus of gallbladder without cholecystitis without obstruction: Secondary | ICD-10-CM | POA: Insufficient documentation

## 2015-07-20 DIAGNOSIS — N390 Urinary tract infection, site not specified: Secondary | ICD-10-CM | POA: Insufficient documentation

## 2015-07-20 DIAGNOSIS — Z791 Long term (current) use of non-steroidal anti-inflammatories (NSAID): Secondary | ICD-10-CM | POA: Insufficient documentation

## 2015-07-20 HISTORY — DX: Urinary tract infection, site not specified: N39.0

## 2015-07-20 LAB — COMPREHENSIVE METABOLIC PANEL
ALK PHOS: 58 U/L (ref 38–126)
ALT: 36 U/L (ref 14–54)
AST: 20 U/L (ref 15–41)
Albumin: 4.3 g/dL (ref 3.5–5.0)
Anion gap: 12 (ref 5–15)
BILIRUBIN TOTAL: 0.7 mg/dL (ref 0.3–1.2)
BUN: 10 mg/dL (ref 6–20)
CO2: 22 mmol/L (ref 22–32)
Calcium: 9.1 mg/dL (ref 8.9–10.3)
Chloride: 104 mmol/L (ref 101–111)
Creatinine, Ser: 1 mg/dL (ref 0.44–1.00)
GFR calc Af Amer: >60 mL/min (ref >60)
Glucose, Bld: 115 mg/dL — ABNORMAL HIGH (ref 65–99)
POTASSIUM: 3.3 mmol/L — AB (ref 3.5–5.1)
Sodium: 138 mmol/L (ref 135–145)
TOTAL PROTEIN: 8.1 g/dL (ref 6.5–8.1)

## 2015-07-20 LAB — URINALYSIS COMPLETE WITH MICROSCOPIC (ARMC ONLY)
BILIRUBIN URINE: NEGATIVE
GLUCOSE, UA: NEGATIVE mg/dL
KETONES UR: NEGATIVE mg/dL
NITRITE: NEGATIVE
PH: 6 (ref 5.0–8.0)
Protein, ur: 100 mg/dL — AB
Specific Gravity, Urine: 1.02 (ref 1.005–1.030)

## 2015-07-20 LAB — LIPASE, BLOOD: Lipase: 19 U/L (ref 11–51)

## 2015-07-20 LAB — CBC WITH DIFFERENTIAL/PLATELET
Basophils Absolute: 0 10*3/uL (ref 0–0.1)
Basophils Relative: 1 %
Eosinophils Absolute: 0.1 10*3/uL (ref 0–0.7)
Eosinophils Relative: 1 %
HEMATOCRIT: 37.7 % (ref 35.0–47.0)
HEMOGLOBIN: 12.6 g/dL (ref 12.0–16.0)
LYMPHS ABS: 1.4 10*3/uL (ref 1.0–3.6)
Lymphocytes Relative: 22 %
MCH: 26.7 pg (ref 26.0–34.0)
MCHC: 33.6 g/dL (ref 32.0–36.0)
MCV: 79.5 fL — ABNORMAL LOW (ref 80.0–100.0)
MONO ABS: 0.3 10*3/uL (ref 0.2–0.9)
MONOS PCT: 5 %
NEUTROS ABS: 4.6 10*3/uL (ref 1.4–6.5)
NEUTROS PCT: 71 %
Platelets: 207 10*3/uL (ref 150–440)
RBC: 4.74 MIL/uL (ref 3.80–5.20)
RDW: 13.5 % (ref 11.5–14.5)
WBC: 6.4 10*3/uL (ref 3.6–11.0)

## 2015-07-20 LAB — POCT PREGNANCY, URINE: Preg Test, Ur: NEGATIVE

## 2015-07-20 MED ORDER — HYDROCODONE-ACETAMINOPHEN 5-325 MG PO TABS: 1.0000 | ORAL_TABLET | ORAL | Status: DC | PRN

## 2015-07-20 MED ORDER — CEPHALEXIN 500 MG PO CAPS: 500.0000 mg | ORAL_CAPSULE | Freq: Two times a day (BID) | ORAL | Status: DC

## 2015-07-20 MED ORDER — ONDANSETRON HCL 4 MG PO TABS: ORAL_TABLET | ORAL | Status: DC

## 2015-07-20 MED ORDER — DOCUSATE SODIUM 100 MG PO CAPS: ORAL_CAPSULE | ORAL | Status: DC

## 2015-07-20 MED ORDER — DEXTROSE 5 % IV SOLN
1.0000 g | INTRAVENOUS | Status: AC
  Administered 2015-07-20: 1 g via INTRAVENOUS
  Filled 2015-07-20: qty 10

## 2015-07-20 NOTE — ED Notes (Signed)
Patient transported to Ultrasound 

## 2015-07-20 NOTE — ED Provider Notes (Signed)
[facility] Emergency Department Provider Note  ____________________________________________  Time seen: Approximately 12:23 PM  I have reviewed the triage vital signs and the nursing notes.   HISTORY  Chief Complaint Abdominal Pain    HPI Kynzi M Beale is a 35 y.o. female with a past medical history that includes tubal ligation after her C-section about a year and a half ago and a history of symptomatic cholelithiasis who presentsfor evaluation of persistent right-sided abdominal pain over the last 6 days.  She reports that it started rather acutely 6 days ago and has been persistent.  Food seems to make it worse and nothing makes it better.  She has had some nausea but not currently.  She denies vomiting.  She says sometimes the pain radiates from her right side to her back.  She denies chest pain and shortness of breath as well as fever and chills.  She describes the pain as a sharp and stabbing pain as well as occasionally burning.  She also reports some burning when urinating and increased frequency of urination and decreased quantity of urination.  She wonders if she has a urinary tract infection.     No past medical history on file.  There are no active problems to display for this patient.   Past Surgical History  Procedure Laterality Date  . Cesarean section  12/27/2011  . Cesarean section      Current Outpatient Rx  Name  Route  Sig  Dispense  Refill  . acetaminophen (TYLENOL) 500 MG tablet   Oral   Take 1,000 mg by mouth every 6 (six) hours as needed for headache.         Marland Kitchen amoxicillin (AMOXIL) 500 MG capsule   Oral   Take 1 capsule (500 mg total) by mouth 3 (three) times daily.   30 capsule   0   . brompheniramine-pseudoephedrine-DM 30-2-10 MG/5ML syrup   Oral   Take 5 mLs by mouth 4 (four) times daily as needed.   120 mL   0   . cephALEXin (KEFLEX) 500 MG capsule   Oral   Take 1 capsule (500 mg total) by mouth 2 (two)  times daily.   14 capsule   0   . docusate sodium (COLACE) 100 MG capsule      Take 1 tablet once or twice daily as needed for constipation while taking narcotic pain medicine   30 capsule   0   . HYDROcodone-acetaminophen (NORCO/VICODIN) 5-325 MG tablet   Oral   Take 1-2 tablets by mouth every 4 (four) hours as needed for moderate pain.   30 tablet   0   . ibuprofen (ADVIL,MOTRIN) 200 MG tablet   Oral   Take 200 mg by mouth every 6 (six) hours as needed.         Marland Kitchen ibuprofen (ADVIL,MOTRIN) 800 MG tablet   Oral   Take 1 tablet (800 mg total) by mouth every 8 (eight) hours as needed for moderate pain.   15 tablet   0   . ibuprofen (ADVIL,MOTRIN) 800 MG tablet   Oral   Take 1 tablet (800 mg total) by mouth every 8 (eight) hours as needed.   30 tablet   0   . ibuprofen (ADVIL,MOTRIN) 800 MG tablet   Oral   Take 1 tablet (800 mg total) by mouth every 8 (eight) hours as needed for moderate pain.   15 tablet   0   . meloxicam (MOBIC) 7.5 MG  tablet   Oral   Take 2 tablets (15 mg total) by mouth daily.   30 tablet   0   . methocarbamol (ROBAXIN-750) 750 MG tablet   Oral   Take 1 tablet (750 mg total) by mouth 4 (four) times daily.   20 tablet   0   . ondansetron (ZOFRAN ODT) 4 MG disintegrating tablet   Oral   Take 1 tablet (4 mg total) by mouth every 6 (six) hours as needed for nausea or vomiting.   20 tablet   0   . ondansetron (ZOFRAN) 4 MG tablet      Take 1-2 tabs by mouth every 8 hours as needed for nausea/vomiting   30 tablet   0   . predniSONE (DELTASONE) 20 MG tablet   Oral   Take 2 tablets (40 mg total) by mouth daily. Patient not taking: Reported on 03/12/2015   10 tablet   0   . traMADol (ULTRAM) 50 MG tablet   Oral   Take 1 tablet (50 mg total) by mouth every 6 (six) hours as needed.   20 tablet   0   . traMADol (ULTRAM) 50 MG tablet   Oral   Take 1 tablet (50 mg total) by mouth every 6 (six) hours as needed for moderate pain.   12  tablet   0     Allergies Flagyl and Sulfa antibiotics  No family history on file.  Social History Social History  Substance Use Topics  . Smoking status: Never Smoker   . Smokeless tobacco: Not on file  . Alcohol Use: No    Review of Systems Constitutional: No fever/chills Eyes: No visual changes. ENT: No sore throat. Cardiovascular: Denies chest pain. Respiratory: Denies shortness of breath. Gastrointestinal: Right-sided abdominal pain with some right flank pain or at least abdominal pain radiating through to the back.  Nausea, no vomiting, no diarrhea. Genitourinary: +dysuria, increased urinary frequency Musculoskeletal: Negative for back pain. Skin: Negative for rash. Neurological: Negative for headaches, focal weakness or numbness.  10-point ROS otherwise negative.  ____________________________________________   PHYSICAL EXAM:  VITAL SIGNS: ED Triage Vitals  Enc Vitals Group     BP 07/20/15 1201 147/89 mmHg     Pulse Rate 07/20/15 1201 101     Resp 07/20/15 1201 18     Temp 07/20/15 1201 98.2 F (36.8 C)     Temp Source 07/20/15 1201 Oral     SpO2 07/20/15 1201 100 %     Weight 07/20/15 1201 178 lb (80.74 kg)     Height 07/20/15 1201 5\' 1"  (1.549 m)     Head Cir --      Peak Flow --      Pain Score 07/20/15 1207 6     Pain Loc --      Pain Edu? --      Excl. in GC? --     Constitutional: Alert and oriented. Well appearing and in no acute distress. Eyes: Conjunctivae are normal. PERRL. EOMI. Head: Atraumatic. Nose: No congestion/rhinnorhea. Mouth/Throat: Mucous membranes are moist.  Oropharynx non-erythematous. Neck: No stridor.  No meningeal signs.   Cardiovascular: Normal rate, regular rhythm. Good peripheral circulation. Grossly normal heart sounds.   Respiratory: Normal respiratory effort.  No retractions. Lungs CTAB. Gastrointestinal: Soft, Obese, tender to palpation of the epigastrium and right upper quadrant with positive Murphy sign.  Also  moderately tender in the suprapubic region Genitourinary: Deferred Musculoskeletal: No lower extremity tenderness nor edema. No gross deformities  of extremities. Neurologic:  Normal speech and language. No gross focal neurologic deficits are appreciated.  Skin:  Skin is warm, dry and intact. No rash noted. Psychiatric: Mood and affect are normal. Speech and behavior are normal.  ____________________________________________   LABS (all labs ordered are listed, but only abnormal results are displayed)  Labs Reviewed  COMPREHENSIVE METABOLIC PANEL - Abnormal; Notable for the following:    Potassium 3.3 (*)    Glucose, Bld 115 (*)    All other components within normal limits  URINALYSIS COMPLETEWITH MICROSCOPIC ([facility] ONLY) - Abnormal; Notable for the following:    Color, Urine YELLOW (*)    APPearance CLOUDY (*)    Hgb urine dipstick 1+ (*)    Protein, ur 100 (*)    Leukocytes, UA 1+ (*)    Bacteria, UA FEW (*)    Squamous Epithelial / LPF 6-30 (*)    All other components within normal limits  CBC WITH DIFFERENTIAL/PLATELET - Abnormal; Notable for the following:    MCV 79.5 (*)    All other components within normal limits  URINE CULTURE  LIPASE, BLOOD  POC URINE PREG, ED  POCT PREGNANCY, URINE   ____________________________________________  EKG  None ____________________________________________  RADIOLOGY   US Abdomen Limited Ruq  07/20/2015  CLINICAL DATA:  Patient with severe right upper quadrant epigastric pain for 5 days. EXAM: US ABDOMEN LIMITED - RIGHT UPPER QUADRANT COMPARISON:  Ultrasound 10/23/2013. FINDINGS: Gallbladder: Multiple gallstones are demonstrated within the gallbladder lumen. The majority of these are mobile. There appears to be at least 2 stones which are nonmobile and located within the gallbladder neck. No gallbladder wall thickening or pericholecystic fluid. Negative sonographic Murphy's sign. Common bile duct: Diameter: 3 mm Liver: No focal lesion  identified. Within normal limits in parenchymal echogenicity. IMPRESSION: Multiple stones are demonstrated within the gallbladder lumen. At least two of these stones appear to be impacted within the gallbladder neck. No gallbladder wall thickening or pericholecystic fluid to suggest acute cholecystitis. Electronically Signed   By: Annia Belt M.D.   On: 07/20/2015 13:59    ____________________________________________   PROCEDURES  Procedure(s) performed: None  Critical Care performed: No ____________________________________________   INITIAL IMPRESSION / ASSESSMENT AND PLAN / ED COURSE  Pertinent labs & imaging results that were available during my care of the patient were reviewed by me and considered in my medical decision making (see chart for details).  Well-appearing, NAD, declines pain meds at this time.  Reviewed past medical records which reveal hx of symptomatic cholelithiasis while she was pregnant about 1.5 years ago, and she never had a cholecystectomy.  UTI is also likely, less likely pyelo.  Will proceed with RUQ U/S and standard lab workup.  ----------------------------------------- 2:19 PM on 07/20/2015 -----------------------------------------  I reviewed the ocean report that shows the patient has multiple gallstones and 2 that appear to be nonmobile in the neck of the gallbladder.  I discussed the findings with her and explained that I would be happy to call the surgeon if she thought she was willing to stay in the hospital.  She stated, however, that she would not be able to stay due to childcare issues and that she needs to work out childcare arrangements before she has a surgery.  Given the fact that her pain is well-controlled at this time and she is declining pain medication, I will treat her urinary tract infection and give her information for outpatient follow-up with surgery.  I spoke by phone with Dr. Tonita Cong  and gave him her information as well as [facility]  [facility]  He informed me, and I passed along the information to the patient, that he has appointments available all week and his [place] office and I encouraged her to follow up as soon as possible.  I also gave her strict return precautions if her symptoms worsen or she develops a fever. ____________________________________________  FINAL CLINICAL IMPRESSION(S) / ED DIAGNOSES  Final diagnoses:  Symptomatic cholelithiasis  UTI (lower urinary tract infection)     MEDICATIONS GIVEN DURING THIS VISIT:  Medications  cefTRIAXone (ROCEPHIN) 1 g in dextrose 5 % 50 mL IVPB     NEW OUTPATIENT MEDICATIONS STARTED DURING THIS VISIT:  New Prescriptions   CEPHALEXIN (KEFLEX) 500 MG CAPSULE    Take 1 capsule (500 mg total) by mouth 2 (two) times daily.   DOCUSATE SODIUM (COLACE) 100 MG CAPSULE    Take 1 tablet once or twice daily as needed for constipation while taking narcotic pain medicine   HYDROCODONE-ACETAMINOPHEN (NORCO/VICODIN) 5-325 MG TABLET    Take 1-2 tablets by mouth every 4 (four) hours as needed for moderate pain.   ONDANSETRON (ZOFRAN) 4 MG TABLET    Take 1-2 tabs by mouth every 8 hours as needed for nausea/vomiting      Note:  This document was prepared using Dragon voice recognition software and may include unintentional dictation errors.   Loleta Roseory Lothar Prehn, MD 07/20/15 1447

## 2015-07-20 NOTE — Discharge Instructions (Signed)
You have been seen in the Emergency Department (ED) for abdominal pain.  Your evaluation suggests that your pain is caused by gallstones.  Fortunately you do not need immediate surgery at this time, but it is important that you follow up with a surgeon as an outpatient; typically surgical removal of the gallbladder is the only thing that will definitively fix your issue.  Read through the included information about a bland diet, and use any prescribed medications as instructed.  Avoid smoking and alcohol use.  Please follow up as instructed above regarding todays emergent visit and the symptoms that are bothering you.  It is very important that you and follow-up with Dr. Tonita Cong at the next available opportunity because gallstones will not get better on their own.  Take Norco as prescribed. Do not drink alcohol, drive or participate in any other potentially dangerous activities while taking this medication as it may make you sleepy. Do not take this medication with any other sedating medications, either prescription or over-the-counter. If you were prescribed Percocet or Vicodin, do not take these with acetaminophen (Tylenol) as it is already contained within these medications.   This medication is an opiate (or narcotic) pain medication and can be habit forming.  Use it as little as possible to achieve adequate pain control.  Do not use or use it with extreme caution if you have a history of opiate abuse or dependence.  If you are on a pain contract with your primary care doctor or a pain specialist, be sure to let them know you were prescribed this medication today from the [facility] Emergency Department.  This medication is intended for your use only - do not give any to anyone else and keep it in a secure place where nobody else, especially children, have access to it.  It will also cause or worsen constipation, so you may want to consider taking an over-the-counter stool softener while you are  taking this medication.  You also have a urinary tract infection.  Please take the full course of antibiotics as prescribed.    Return to the ED if your abdominal pain worsens or fails to improve, you develop bloody vomiting, bloody diarrhea, you are unable to tolerate fluids due to vomiting, fever greater than 101, or other symptoms that concern you.   Biliary Colic Biliary colic is a pain in the upper abdomen. The pain:  Is usually felt on the right side of the abdomen, but it may also be felt in the center of the abdomen, just below the breastbone (sternum).  May spread back toward the right shoulder blade.  May be steady or irregular.  May be accompanied by nausea and vomiting. Most of the time, the pain goes away in 1-5 hours. After the most intense pain passes, the abdomen may continue to ache mildly for about 24 hours. Biliary colic is caused by a blockage in the bile duct. The bile duct is a pathway that carries bile--a liquid that helps to digest fats--from the gallbladder to the small intestine. Biliary colic usually occurs after eating, when the digestive system demands bile. The pain develops when muscle cells contract forcefully to try to move the blockage so that bile can get by. HOME CARE INSTRUCTIONS  Take medicines only as directed by your health care provider.  Drink enough fluid to keep your urine clear or pale yellow.  Avoid fatty, greasy, and fried foods. These kinds of foods increase your body's demand for bile.  Avoid any  foods that make your pain worse.  Avoid overeating.  Avoid having a large meal after fasting. SEEK MEDICAL CARE IF:  You develop a fever.  Your pain gets worse.  You vomit.  You develop nausea that prevents you from eating and drinking. SEEK IMMEDIATE MEDICAL CARE IF:  You suddenly develop a fever and shaking chills.  You develop a yellowish discoloration (jaundice) of:  Skin.  Whites of the eyes.  Mucous membranes.  You  have continuous or severe pain that is not relieved with medicines.  You have nausea and vomiting that is not relieved with medicines.  You develop dizziness or you faint.   This information is not intended to replace advice given to you by your health care provider. Make sure you discuss any questions you have with your health care provider.   Document Released: 08/02/2005 Document Revised: 07/16/2014 Document Reviewed: 12/11/2013 Elsevier Interactive Patient Education [employer]2016 Else[employer] Inc.

## 2015-07-20 NOTE — ED Notes (Signed)
Reviewed d/c instructions, follow-up care and prescriptions with pt. Pt verbalized understanding 

## 2015-07-20 NOTE — ED Notes (Signed)
Pt reports abd on right side that started last week denies radiation. Pt also c/o urinary frequency denies burning sensation. Denies N/V/D or fever.

## 2015-07-22 ENCOUNTER — Ambulatory Visit (INDEPENDENT_AMBULATORY_CARE_PROVIDER_SITE_OTHER): Payer: Self-pay | Admitting: General Surgery

## 2015-07-22 ENCOUNTER — Encounter: Payer: Self-pay | Admitting: General Surgery

## 2015-07-22 VITALS — BP 138/80 | HR 94 | Temp 97.6°F | Ht 61.0 in | Wt 208.0 lb

## 2015-07-22 DIAGNOSIS — K8020 Calculus of gallbladder without cholecystitis without obstruction: Secondary | ICD-10-CM | POA: Insufficient documentation

## 2015-07-22 LAB — URINE CULTURE
Culture: >=100000 — AB
SPECIAL REQUESTS: NORMAL

## 2015-07-22 NOTE — Patient Instructions (Signed)
You have requested to have your Gallbladder removed. We will arrange this to be done on 08/05/15 at [facility] with Dr. Tonita CongWoodham.  You will be off from work for approximately 1-2 weeks depending on your recovery.   Please avoid greasy and fried foods if at all possible prior to your scheduled surgery to decrease symptoms until then.  Please see the [facility] pre-care form you have been given today.  If you have any questions or concerns please call our office.

## 2015-07-22 NOTE — Progress Notes (Signed)
Patient ID: Tammy Frost, female   DOB: 28-Mar-1980, 35 y.o.   MRN: [id number]  CC: ABDOMINAL PAIN  HPI [name] is a 35 y.o. female who presents to clinic for follow-up from a recent emergency room visit for abdominal pain. Patient reports over the weekend after eating a fried chicken meal from both angles she developed severe right upper quadrant abdominal pain prompted her to go to the emergency room. She has subjective nausea but denied any vomiting. She states she had something similar to this when she was pregnant many years ago and was told she had gallstones within had no problems over the weekend. She denies any fevers, chills, nausea, vomiting, diarrhea, gas patient, chest pain, shortness breath. She states the pain is almost subsided but she continues to have some soreness over the right upper quadrant. She was offered inpatient consultation for surgery over the weekend but declined secondary to child care issues. She has since been able to arrange childcare with her extended family and desires to schedule surgery.  HPI  History reviewed. No pertinent past medical history.  Past Surgical History  Procedure Laterality Date  . Cesarean section  12/27/2011  . Cesarean section      History reviewed. No pertinent family history.  Social History Social History  Substance Use Topics  . Smoking status: Never Smoker   . Smokeless tobacco: None  . Alcohol Use: No    Allergies  Allergen Reactions  . Flagyl [Metronidazole] Hives and Rash  . Sulfa Antibiotics Hives and Rash    Current Outpatient Prescriptions  Medication Sig Dispense Refill  . acetaminophen (TYLENOL) 500 MG tablet Take 1,000 mg by mouth every 6 (six) hours as needed for headache.    Marland Kitchen amoxicillin (AMOXIL) 500 MG capsule Take 1 capsule (500 mg total) by mouth 3 (three) times daily. 30 capsule 0  . brompheniramine-pseudoephedrine-DM 30-2-10 MG/5ML syrup Take 5 mLs by mouth 4 (four) times daily as needed. 120 mL 0   . cephALEXin (KEFLEX) 500 MG capsule Take 1 capsule (500 mg total) by mouth 2 (two) times daily. 14 capsule 0  . docusate sodium (COLACE) 100 MG capsule Take 1 tablet once or twice daily as needed for constipation while taking narcotic pain medicine 30 capsule 0  . HYDROcodone-acetaminophen (NORCO/VICODIN) 5-325 MG tablet Take 1-2 tablets by mouth every 4 (four) hours as needed for moderate pain. 30 tablet 0  . ibuprofen (ADVIL,MOTRIN) 200 MG tablet Take 200 mg by mouth every 6 (six) hours as needed.    Marland Kitchen ibuprofen (ADVIL,MOTRIN) 800 MG tablet Take 1 tablet (800 mg total) by mouth every 8 (eight) hours as needed for moderate pain. 15 tablet 0  . ibuprofen (ADVIL,MOTRIN) 800 MG tablet Take 1 tablet (800 mg total) by mouth every 8 (eight) hours as needed. 30 tablet 0  . ibuprofen (ADVIL,MOTRIN) 800 MG tablet Take 1 tablet (800 mg total) by mouth every 8 (eight) hours as needed for moderate pain. 15 tablet 0  . meloxicam (MOBIC) 7.5 MG tablet Take 2 tablets (15 mg total) by mouth daily. 30 tablet 0  . methocarbamol (ROBAXIN-750) 750 MG tablet Take 1 tablet (750 mg total) by mouth 4 (four) times daily. 20 tablet 0  . ondansetron (ZOFRAN ODT) 4 MG disintegrating tablet Take 1 tablet (4 mg total) by mouth every 6 (six) hours as needed for nausea or vomiting. 20 tablet 0  . ondansetron (ZOFRAN) 4 MG tablet Take 1-2 tabs by mouth every 8 hours as needed for  nausea/vomiting 30 tablet 0  . predniSONE (DELTASONE) 20 MG tablet Take 2 tablets (40 mg total) by mouth daily. 10 tablet 0  . traMADol (ULTRAM) 50 MG tablet Take 1 tablet (50 mg total) by mouth every 6 (six) hours as needed. 20 tablet 0  . traMADol (ULTRAM) 50 MG tablet Take 1 tablet (50 mg total) by mouth every 6 (six) hours as needed for moderate pain. 12 tablet 0   No current facility-administered medications for this visit.     Review of Systems A Multi-point review of systems was asked and was negative except for the findings documented in the  history of present illness  Physical Exam Blood pressure 138/80, pulse 94, temperature 97.6 F (36.4 C), temperature source Oral, height  (1.549 m), weight 94.348 kg (208 lb), last menstrual period 07/12/2015. CONSTITUTIONAL: No acute distress. EYES: Pupils are equal, round, and reactive to light, Sclera are non-icteric. EARS, NOSE, MOUTH AND THROAT: The oropharynx is clear. The oral mucosa is pink and moist. Hearing is intact to voice. LYMPH NODES:  Lymph nodes in the neck are normal. RESPIRATORY:  Lungs are clear. There is normal respiratory effort, with equal breath sounds bilaterally, and without pathologic use of accessory muscles. CARDIOVASCULAR: Heart is regular without murmurs, gallops, or rubs. GI: The abdomen is large, soft, minimally tender to deep palpation in the right upper quadrant but without any Murphy sign or evidence of peritonitis, and nondistended. There are no palpable masses. There is no hepatosplenomegaly. There are normal bowel sounds in all quadrants. GU: Rectal deferred.   MUSCULOSKELETAL: Normal muscle strength and tone. No cyanosis or edema.   SKIN: Turgor is good and there are no pathologic skin lesions or ulcers. NEUROLOGIC: Motor and sensation is grossly normal. Cranial nerves are grossly intact. PSYCH:  Oriented to person, place and time. Affect is normal.  Data Reviewed Images and labs reviewed. Labs from the ER were within normal limits with a white blood cell count 6.4, total bilirubin 0.7, AST of 36, AST of 20, alkaline phosphatase of 58. Ultrasound that time did show multiple gallstones without any evidence of pericholecystic fluid, ductal dilatation, gallbladder wall thickening. Patient also had a urinalysis which showed evidence of an Escherichia coli urinary tract infection I have personally reviewed the patient's imaging, laboratory findings and medical records.    Assessment    Symptomatic cholelithiasis    Plan    35 year old female with  symptomatic cholelithiasis. I discussed the procedure of a laparoscopic cholecystectomy in detail.  The patient was given Agricultural engineer.  We discussed the risks and benefits of a laparoscopic cholecystectomy and possible cholangiogram including, but not limited to bleeding, infection, injury to surrounding structures such as the intestine or liver, bile leak, retained gallstones, need to convert to an open procedure, prolonged diarrhea, blood clots such as  DVT, common bile duct injury, anesthesia risks, and possible need for additional procedures.  The likelihood of improvement in symptoms and return to the patient's normal status is good. We discussed the typical post-operative recovery course. Patient voiced understanding wished proceed with surgery. We'll plan to schedule surgery for Tuesday, May 23. Patient need of resolution of her urinary tract infection prior to surgery. Also discussed with patient that should her pain worsen or she become unable to tolerate a diet at home that she is to return to emergency department for an urgent cholecystectomy. She voiced understanding and agrees.      Time spent with the patient was 30 minutes, with more  than 50% of the time spent in face-to-face education, counseling and care coordination.     Ricarda Frameharles Taiana Temkin, MD FACS General Surgeon 07/22/2015, 2:22 PM

## 2015-07-23 ENCOUNTER — Telehealth: Payer: Self-pay | Admitting: General Surgery

## 2015-07-23 NOTE — Telephone Encounter (Signed)
Pt advised of pre op date/time and sx date. Sx: 08/05/15 with Dr Devoria AlbeWoodham--laparoscopic cholecystectomy.  Pre op: 07/28/15 between 9-1:00pm--Phone.   Patient made aware to call [phone], between 1-3:00pm the day before surgery, to find out what time to arrive.    Patient advised to pay a 100.00 deposit towards surgery--patient is self pay at this time.

## 2015-07-27 ENCOUNTER — Emergency Department
Admission: EM | Admit: 2015-07-27 | Discharge: 2015-07-27 | Disposition: A | Discharge status: Home / Self Care | Payer: Self-pay | Attending: Emergency Medicine | Admitting: Emergency Medicine

## 2015-07-27 ENCOUNTER — Encounter: Payer: Self-pay | Admitting: Emergency Medicine

## 2015-07-27 DIAGNOSIS — K8050 Calculus of bile duct without cholangitis or cholecystitis without obstruction: Secondary | ICD-10-CM | POA: Insufficient documentation

## 2015-07-27 DIAGNOSIS — Z79899 Other long term (current) drug therapy: Secondary | ICD-10-CM | POA: Insufficient documentation

## 2015-07-27 DIAGNOSIS — Z792 Long term (current) use of antibiotics: Secondary | ICD-10-CM | POA: Insufficient documentation

## 2015-07-27 LAB — COMPREHENSIVE METABOLIC PANEL
ALT: 28 U/L (ref 14–54)
AST: 49 U/L — AB (ref 15–41)
Albumin: 4.2 g/dL (ref 3.5–5.0)
Alkaline Phosphatase: 52 U/L (ref 38–126)
Anion gap: 8 (ref 5–15)
BUN: 13 mg/dL (ref 6–20)
CHLORIDE: 103 mmol/L (ref 101–111)
CO2: 25 mmol/L (ref 22–32)
Calcium: 9 mg/dL (ref 8.9–10.3)
Creatinine, Ser: 1.3 mg/dL — ABNORMAL HIGH (ref 0.44–1.00)
GFR calc Af Amer: >60 mL/min (ref >60)
GFR, EST NON AFRICAN AMERICAN: 53 mL/min — AB (ref >60)
Glucose, Bld: 184 mg/dL — ABNORMAL HIGH (ref 65–99)
POTASSIUM: 3.7 mmol/L (ref 3.5–5.1)
SODIUM: 136 mmol/L (ref 135–145)
Total Bilirubin: 0.8 mg/dL (ref 0.3–1.2)
Total Protein: 7.6 g/dL (ref 6.5–8.1)

## 2015-07-27 LAB — POCT PREGNANCY, URINE: PREG TEST UR: NEGATIVE

## 2015-07-27 LAB — CBC
HEMATOCRIT: 37.5 % (ref 35.0–47.0)
Hemoglobin: 12.7 g/dL (ref 12.0–16.0)
MCH: 27.2 pg (ref 26.0–34.0)
MCHC: 33.8 g/dL (ref 32.0–36.0)
MCV: 80.5 fL (ref 80.0–100.0)
Platelets: 218 10*3/uL (ref 150–440)
RBC: 4.65 MIL/uL (ref 3.80–5.20)
RDW: 13.8 % (ref 11.5–14.5)
WBC: 9 10*3/uL (ref 3.6–11.0)

## 2015-07-27 LAB — URINALYSIS COMPLETE WITH MICROSCOPIC (ARMC ONLY)
Bilirubin Urine: NEGATIVE
Glucose, UA: NEGATIVE mg/dL
HGB URINE DIPSTICK: NEGATIVE
Nitrite: NEGATIVE
PROTEIN: 30 mg/dL — AB
SPECIFIC GRAVITY, URINE: 1.021 (ref 1.005–1.030)
pH: 6 (ref 5.0–8.0)

## 2015-07-27 LAB — LIPASE, BLOOD: LIPASE: 50 U/L (ref 11–51)

## 2015-07-27 NOTE — Discharge Instructions (Signed)
Cholelithiasis °Cholelithiasis (also called gallstones) is a form of gallbladder disease. The gallbladder is a small organ that helps you digest fats. Symptoms of gallstones are: °· Feeling sick to your stomach (nausea). °· Throwing up (vomiting). °· Belly pain. °· Yellowing of the skin (jaundice). °· Sudden pain. You may feel the pain for minutes to hours. °· Fever. °· Pain to the touch. °HOME CARE °· Only take medicines as told by your doctor. °· Eat a low-fat diet until you see your doctor again. Eating fat can result in pain. °· Follow up with your doctor as told. Attacks usually happen time after time. Surgery is usually needed for permanent treatment. °GET HELP RIGHT AWAY IF:  °· Your pain gets worse. °· Your pain is not helped by medicines. °· You have a fever and lasting symptoms for more than 2-3 days. °· You have a fever and your symptoms suddenly get worse. °· You keep feeling sick to your stomach and throwing up. °MAKE SURE YOU:  °· Understand these instructions. °· Will watch your condition. °· Will get help right away if you are not doing well or get worse. °  °This information is not intended to replace advice given to you by your health care provider. Make sure you discuss any questions you have with your health care provider. °  °Document Released: 08/18/2007 Document Revised: 11/01/2012 Document Reviewed: 08/23/2012 °Elsevier Interactive Patient Education ©2016 [employer] ° °

## 2015-07-27 NOTE — ED Notes (Signed)
Patient arrives to [facility] ED via POV  With complaint of abdominal pain for 2 weeks. Patient was seen last week for same and has surgery scheduled May 23rd. Patient presents with increasing uncontrolled pain

## 2015-07-27 NOTE — ED Provider Notes (Signed)
[facility] Emergency Department Provider Note   ____________________________________________  Time seen: Approximately 915 PM  I have reviewed the triage vital signs and the nursing notes.   HISTORY  Chief Complaint Abdominal Pain   HPI Tammy Frost is a 35 y.o. female with a recent diagnosis of cholelithiasis and surgery scheduled for the 23rd was having right upper quadrant radiating into her right chest earlier this afternoon. Said the pain lasted about 30 minutes and was cramping and severe. She says that the pain has subsided at this time and she only has a dull right upper quadrant pain. She has no nausea or vomiting. Takes Norco at home for pain relief. No fevers.   History reviewed. No pertinent past medical history.  Patient Active Problem List   Diagnosis Date Noted  . Cholelithiasis 07/22/2015    Past Surgical History  Procedure Laterality Date  . Cesarean section  12/27/2011  . Cesarean section      Current Outpatient Rx  Name  Route  Sig  Dispense  Refill  . cephALEXin (KEFLEX) 500 MG capsule   Oral   Take 1 capsule (500 mg total) by mouth 2 (two) times daily.   14 capsule   0   . docusate sodium (COLACE) 100 MG capsule      Take 1 tablet once or twice daily as needed for constipation while taking narcotic pain medicine   30 capsule   0   . HYDROcodone-acetaminophen (NORCO/VICODIN) 5-325 MG tablet   Oral   Take 1-2 tablets by mouth every 4 (four) hours as needed for moderate pain.   30 tablet   0   . amoxicillin (AMOXIL) 500 MG capsule   Oral   Take 1 capsule (500 mg total) by mouth 3 (three) times daily.   30 capsule   0   . ibuprofen (ADVIL,MOTRIN) 800 MG tablet   Oral   Take 1 tablet (800 mg total) by mouth every 8 (eight) hours as needed for moderate pain.   15 tablet   0   . ibuprofen (ADVIL,MOTRIN) 800 MG tablet   Oral   Take 1 tablet (800 mg total) by mouth every 8 (eight) hours as needed.   30  tablet   0   . ibuprofen (ADVIL,MOTRIN) 800 MG tablet   Oral   Take 1 tablet (800 mg total) by mouth every 8 (eight) hours as needed for moderate pain.   15 tablet   0   . meloxicam (MOBIC) 7.5 MG tablet   Oral   Take 2 tablets (15 mg total) by mouth daily.   30 tablet   0   . methocarbamol (ROBAXIN-750) 750 MG tablet   Oral   Take 1 tablet (750 mg total) by mouth 4 (four) times daily.   20 tablet   0   . ondansetron (ZOFRAN ODT) 4 MG disintegrating tablet   Oral   Take 1 tablet (4 mg total) by mouth every 6 (six) hours as needed for nausea or vomiting.   20 tablet   0   . ondansetron (ZOFRAN) 4 MG tablet      Take 1-2 tabs by mouth every 8 hours as needed for nausea/vomiting   30 tablet   0   . predniSONE (DELTASONE) 20 MG tablet   Oral   Take 2 tablets (40 mg total) by mouth daily.   10 tablet   0   . traMADol (ULTRAM) 50 MG tablet   Oral  Take 1 tablet (50 mg total) by mouth every 6 (six) hours as needed.   20 tablet   0   . traMADol (ULTRAM) 50 MG tablet   Oral   Take 1 tablet (50 mg total) by mouth every 6 (six) hours as needed for moderate pain.   12 tablet   0     Allergies Flagyl and Sulfa antibiotics  History reviewed. No pertinent family history.  Social History Social History  Substance Use Topics  . Smoking status: Never Smoker   . Smokeless tobacco: None  . Alcohol Use: No    Review of Systems Constitutional: No fever/chills Eyes: No visual changes. ENT: No sore throat. Cardiovascular:As above Respiratory: Denies shortness of breath. Gastrointestinal:  No nausea, no vomiting.  No diarrhea.  No constipation. Genitourinary: Negative for dysuria. Musculoskeletal: Negative for back pain. Skin: Negative for rash. Neurological: Negative for headaches, focal weakness or numbness.  10-point ROS otherwise negative.  ____________________________________________   PHYSICAL EXAM:  VITAL SIGNS: ED Triage Vitals  Enc Vitals Group      BP 07/27/15 1823 141/76 mmHg     Pulse Rate 07/27/15 1823 79     Resp 07/27/15 1823 20     Temp 07/27/15 1823 98.3 F (36.8 C)     Temp Source 07/27/15 1823 Oral     SpO2 07/27/15 1823 97 %     Weight 07/27/15 1823 202 lb (91.627 kg)     Height 07/27/15 1823 5\' 1"  (1.549 m)     Head Cir --      Peak Flow --      Pain Score 07/27/15 1831 5     Pain Loc --      Pain Edu? --      Excl. in GC? --     Constitutional: Alert and oriented. Well appearing and in no acute distress. Eyes: Conjunctivae are normal. PERRL. EOMI. Head: Atraumatic. Nose: No congestion/rhinnorhea. Mouth/Throat: Mucous membranes are moist.   Neck: No stridor.   Cardiovascular: Normal rate, regular rhythm. Grossly normal heart sounds.   Respiratory: Normal respiratory effort.  No retractions. Lungs CTAB. Gastrointestinal: Soft With right upper quadrant tenderness palpation which is mild. Negative Murphy sign. No distention. No CVA tenderness. Musculoskeletal: No lower extremity tenderness nor edema.  No joint effusions. Neurologic:  Normal speech and language. No gross focal neurologic deficits are appreciated. Skin:  Skin is warm, dry and intact. No rash noted. Psychiatric: Mood and affect are normal. Speech and behavior are normal.  ____________________________________________   LABS (all labs ordered are listed, but only abnormal results are displayed)  Labs Reviewed  COMPREHENSIVE METABOLIC PANEL - Abnormal; Notable for the following:    Glucose, Bld 184 (*)    Creatinine, Ser 1.30 (*)    AST 49 (*)    GFR calc non Af Amer 53 (*)    All other components within normal limits  URINALYSIS COMPLETEWITH MICROSCOPIC ([facility] ONLY) - Abnormal; Notable for the following:    Color, Urine YELLOW (*)    APPearance HAZY (*)    Ketones, ur TRACE (*)    Protein, ur 30 (*)    Leukocytes, UA TRACE (*)    Bacteria, UA RARE (*)    Squamous Epithelial / LPF 6-30 (*)    All other components within normal limits   LIPASE, BLOOD  CBC  POCT PREGNANCY, URINE   ____________________________________________  EKG   ____________________________________________  RADIOLOGY   ____________________________________________   PROCEDURES    ____________________________________________   INITIAL  IMPRESSION / ASSESSMENT AND PLAN / ED COURSE  Pertinent labs & imaging results that were available during my care of the patient were reviewed by me and considered in my medical decision making (see chart for details).  Labs are reassuring. Discussed case with Dr. Tonita CongWoodham who recommends that the patient call the office in the morning for any further issues. I also discussed with the patient that she may return to the emergency department any point for any worsening or concerning symptoms. I believe that she is having continued symptomatic cholelithiasis but there are no objective findings of cholecystitis. Negative Murphy sign. Pain is almost completely relieved at this time. Reassuring labs. Will be discharged to home. ____________________________________________   FINAL CLINICAL IMPRESSION(S) / ED DIAGNOSES  Symptomatic cholelithiasis.    NEW MEDICATIONS STARTED DURING THIS VISIT:  New Prescriptions   No medications on file     Note:  This document was prepared using Dragon voice recognition software and may include unintentional dictation errors.    Myrna Blazeravid Matthew Zora Glendenning, MD 07/27/15 [phone]

## 2015-07-28 ENCOUNTER — Other Ambulatory Visit: Payer: Self-pay

## 2015-07-28 ENCOUNTER — Encounter: Payer: Self-pay | Admitting: *Deleted

## 2015-07-28 NOTE — Patient Instructions (Signed)
  Your procedure is scheduled on: 08-05-15 Report to [facility] 2ND FLOOR To find out your arrival time please call [phone] between 1PM - 3PM on 08-06-15  Remember: Instructions that are not followed completely may result in serious medical risk, up to and including death, or upon the discretion of your surgeon and anesthesiologist your surgery may need to be rescheduled.    _X___ 1. Do not eat food or drink liquids after midnight. No gum chewing or hard candies.     _X___ 2. No Alcohol for 24 hours before or after surgery.   ____ 3. Bring all medications with you on the day of surgery if instructed.    ____ 4. Notify your doctor if there is any change in your medical condition     (cold, fever, infections).     Do not wear jewelry, make-up, hairpins, clips or nail polish.  Do not wear lotions, powders, or perfumes. You may wear deodorant.  Do not shave 48 hours prior to surgery. Men may shave face and neck.  Do not bring valuables to the hospital.    [facility] is not responsible for any belongings or valuables.               Contacts, dentures or bridgework may not be worn into surgery.  Leave your suitcase in the car. After surgery it may be brought to your room.  For patients admitted to the hospital, discharge time is determined by your treatment team.   Patients discharged the day of surgery will not be allowed to drive home.   Please read over the following fact sheets that you were given:     ____ Take these medicines the morning of surgery with A SIP OF WATER:    1. NONE  2.   3.   4.  5.  6.  ____ Fleet Enema (as directed)   ____ Use CHG Soap as directed  ____ Use inhalers on the day of surgery  ____ Stop metformin 2 days prior to surgery    ____ Take 1/2 of usual insulin dose the night before surgery and none on the morning of surgery.   ____ Stop Coumadin/Plavix/aspirin-N/A  _X___ Stop Anti-inflammatories-NO NSAIDS OR ASPIRIN  PRODUCTS-HYDROCODONE OK TO CONTINUE   ____ Stop supplements until after surgery.    ____ Bring C-Pap to the hospital.

## 2015-07-30 ENCOUNTER — Ambulatory Visit: Payer: Self-pay | Admitting: Surgery

## 2015-08-04 ENCOUNTER — Telehealth: Payer: Self-pay

## 2015-08-04 NOTE — Telephone Encounter (Signed)
Surgery has been canceled. The operating room has been advised of the cancellation.

## 2015-08-04 NOTE — Telephone Encounter (Signed)
Patient called in and states that she has a respiratory infection. I explained that we have to cancel surgery and she will have to have clearance from her PCP prior to rescheduling her surgery.  Patient verbalizes understanding of this.  Please cancel patient's surgery and notify surgeon.

## 2015-08-05 ENCOUNTER — Ambulatory Visit: Admission: RE | Admit: 2015-08-05 | Payer: Self-pay | Source: Ambulatory Visit | Admitting: General Surgery

## 2015-08-05 HISTORY — DX: Urinary tract infection, site not specified: N39.0

## 2015-08-05 HISTORY — DX: Anxiety disorder, unspecified: F41.9

## 2015-08-05 SURGERY — LAPAROSCOPIC CHOLECYSTECTOMY
Anesthesia: Choice

## 2015-08-18 ENCOUNTER — Emergency Department
Admission: EM | Admit: 2015-08-18 | Discharge: 2015-08-18 | Disposition: A | Discharge status: Home / Self Care | Payer: Self-pay | Attending: Emergency Medicine | Admitting: Emergency Medicine

## 2015-08-18 ENCOUNTER — Encounter: Payer: Self-pay | Admitting: Emergency Medicine

## 2015-08-18 DIAGNOSIS — R1011 Right upper quadrant pain: Secondary | ICD-10-CM

## 2015-08-18 DIAGNOSIS — R748 Abnormal levels of other serum enzymes: Secondary | ICD-10-CM

## 2015-08-18 DIAGNOSIS — R945 Abnormal results of liver function studies: Secondary | ICD-10-CM | POA: Insufficient documentation

## 2015-08-18 HISTORY — DX: Calculus of gallbladder without cholecystitis without obstruction: K80.20

## 2015-08-18 LAB — COMPREHENSIVE METABOLIC PANEL
ALK PHOS: 91 U/L (ref 38–126)
ALT: 347 U/L — AB (ref 14–54)
ANION GAP: 9 (ref 5–15)
AST: 143 U/L — ABNORMAL HIGH (ref 15–41)
Albumin: 4.5 g/dL (ref 3.5–5.0)
BUN: 10 mg/dL (ref 6–20)
CALCIUM: 9.1 mg/dL (ref 8.9–10.3)
CO2: 20 mmol/L — ABNORMAL LOW (ref 22–32)
CREATININE: 0.94 mg/dL (ref 0.44–1.00)
Chloride: 106 mmol/L (ref 101–111)
Glucose, Bld: 107 mg/dL — ABNORMAL HIGH (ref 65–99)
Potassium: 3.2 mmol/L — ABNORMAL LOW (ref 3.5–5.1)
Sodium: 135 mmol/L (ref 135–145)
Total Bilirubin: 0.4 mg/dL (ref 0.3–1.2)
Total Protein: 7.6 g/dL (ref 6.5–8.1)

## 2015-08-18 LAB — URINALYSIS COMPLETE WITH MICROSCOPIC (ARMC ONLY)
BILIRUBIN URINE: NEGATIVE
Glucose, UA: NEGATIVE mg/dL
KETONES UR: NEGATIVE mg/dL
LEUKOCYTES UA: NEGATIVE
NITRITE: NEGATIVE
PH: 6 (ref 5.0–8.0)
Protein, ur: NEGATIVE mg/dL
SPECIFIC GRAVITY, URINE: 1.012 (ref 1.005–1.030)

## 2015-08-18 LAB — LIPASE, BLOOD: LIPASE: 20 U/L (ref 11–51)

## 2015-08-18 LAB — CBC
HCT: 37.3 % (ref 35.0–47.0)
Hemoglobin: 12.4 g/dL (ref 12.0–16.0)
MCH: 27.1 pg (ref 26.0–34.0)
MCHC: 33.2 g/dL (ref 32.0–36.0)
MCV: 81.7 fL (ref 80.0–100.0)
PLATELETS: 196 10*3/uL (ref 150–440)
RBC: 4.56 MIL/uL (ref 3.80–5.20)
RDW: 13.7 % (ref 11.5–14.5)
WBC: 6.2 10*3/uL (ref 3.6–11.0)

## 2015-08-18 LAB — POCT PREGNANCY, URINE: Preg Test, Ur: NEGATIVE

## 2015-08-18 MED ORDER — ONDANSETRON HCL 4 MG/2ML IJ SOLN
4.0000 mg | Freq: Once | INTRAMUSCULAR | Status: AC
  Administered 2015-08-18: 4 mg via INTRAVENOUS

## 2015-08-18 MED ORDER — MORPHINE SULFATE (PF) 4 MG/ML IV SOLN
4.0000 mg | Freq: Once | INTRAVENOUS | Status: AC
  Administered 2015-08-18: 4 mg via INTRAVENOUS

## 2015-08-18 NOTE — ED Notes (Signed)
R upper abdominal pain x 1 day. Known gallstones.

## 2015-08-18 NOTE — Discharge Instructions (Signed)
Liver Function Tests  Liver function tests are blood tests to see how well your liver is working. The proteins and enzymes measured in the test can alert your health care provider to inflammation, damage, or disease in your liver. It is common to have liver function tests:  · During annual physical exams.  · When you are taking certain medicines.  · If you have liver disease.  · If you drink a lot of alcohol.  · When you are not feeling well.  · When you have other conditions that may affect the liver.  Substances measured may include:   · Alanine transaminase (ALT). This is an enzyme in the liver.  · Aspartate transaminase (AST). This is an enzyme in the liver, heart, and muscles.  · Alkaline phosphatase (ALP). This is a protein in the liver, bile ducts, and bone. It is also in other body tissues.  · Total bilirubin. This is a yellow pigment in bile.  · Albumin. This is a protein in the liver.  · Prothrombin time and international normalized ratio (PT and INR). PT measures the time that it takes for your blood to clot. INR is a calculation of blood clotting time based upon your PT result. It is also calculated based on normal ranges defined by the laboratory that processed your lab test.  · Total protein. This measures two proteins, albumin and globulin, found in the blood.  PREPARATION FOR TEST  How you prepare will depend on which tests are being done and the reason why these tests are being done. You may need to:  · Avoid eating for 4-6 hours before the test or as directed by your health care provider.  · Stop taking certain medicines prior to your blood test as directed by your health care provider.  RESULTS   It is your responsibility to obtain your test results. Ask the lab or department performing the test when and how you will get your results. Contact your health care provider to discuss any questions you have about your results.  RANGE OF NORMAL VALUES  Ranges for normal values may vary among different  labs and hospitals. You should always check with your health care provider after having lab work or other tests done to discuss the meaning of your test results and whether your values are considered within normal limits.  The following are normal ranges for substances measured in liver function tests:  ALT  · Infant: may be twice as high as adult values.  · Child or adult: 4-36 international units/L at 37°C or 4-36 units/L (SI units).  · Elderly: may be slightly higher than adult values.  AST  · Newborn 0-5 days old: 35-140 units/L.  · Child under 3 years old: 15-60 units/L.  · 3-6 years old: 15-50 units/L.  · 6-12 years old: 10-50 units/L.  · 12-18 years old: 10-40 units/L.  · Adult: 0-35 units/L or 0-0.58 microkatal/L (SI units).  · Elderly: slightly higher than adults.  ALP  · Child under 2 years old: 85-235 units/L.  · 2-8 years old: 65-210 units/L.  · 9-15 years old: 60-300 units/L.  · 16-21 years old: 30-200 units/L.  · Adult: 30-120 units/L or 0.5-2.0 microkatal/L (SI units).  · Elderly: slightly higher than adult.  Total bilirubin  · Newborn: 1.0-12.0 mg/dL or 17.1-205 micromoles/L (SI units).  · Adult, elderly, or child: 0.3-1.0 mg/dL or 5.1-17 micromoles/L.  Albumin  · Premature infant: 3.0-4.2 g/dL.  · Newborn: 3.5-5.4 g/dL.  · Infant:   4.4-5.4 g/dL.  · Child: 4.0-5.9 g/dL.  · Adult or elderly: 3.5-5.0 g/dL or 35-50 g/L (SI units).  PT  · 11.0-12.5 seconds; 85%-100%.  INR  · 0.8-1.1.  Total protein  · Premature infant: 4.2-7.6 g/dL.  · Newborn: 4.6-7.4 g/dL.  · Infant: 6.0-6.7 g/dL.  · Child: 6.2-8.0 g/dL.  · Adult or elderly: 6.4-8.3 g/dL or 64-83 g/L (SI units).  MEANING OF RESULTS OUTSIDE NORMAL VALUE RANGES   Sometimes test results can be abnormal due to other factors, such as medicines, exercise, or pregnancy. Follow up with your health care provider if you have any questions about test results outside the normal value ranges.  ALT  · Levels above the normal range, along with other test results, may  indicate liver disease.  AST  · Levels above the normal range, along with other test results, may indicate liver disease. Sometimes levels also increase after burns, surgery, heart attack, muscle damage, or seizure.  ALP  · Levels above the normal range, along with other test results, may indicate biliary obstruction, diseases of the liver, bone disease, thyroid disease, tumors, fractures, leukemia or lymphoma, or several other conditions. People with blood type O or B may show higher levels after a fatty meal.  · Levels below the normal range, along with other test results, may indicate bone and teeth conditions, malnutrition, protein deficiency, or Wilson disease.  Total bilirubin  · Levels above the normal range, along with other test results, may indicate problems with the liver, gallbladder, or bile ducts.  Albumin  · Levels above the normal range, along with other test results, may indicate dehydration. They may also be caused by a diet that is high in protein. Sometimes, the band placed around the upper arm during the process of drawing blood can cause the level of this protein in your blood to rise and give you a result above the normal range.  · Levels below the normal range, along with other tests results, may indicate kidney disease, liver disease, or malabsorption of nutrients.  PT and INR  · Levels above the normal range mean your blood is clotting slower than normal. This may be due to blood disorders, liver disorders, or low levels of vitamin K.  Total protein  · Levels above the normal range, along with other test results, may be due to infection or other diseases.  · Levels below the normal range, along with other test results, may be due to an immune system disorder, bleeding, burns, kidney disorder, liver disease, trouble absorbing or getting enough nutrients, or other conditions that affect the intestines.     This information is not intended to replace advice given to you by your health care  provider. Make sure you discuss any questions you have with your health care provider.     Document Released: 04/03/2004 Document Revised: 03/22/2014 Document Reviewed: 07/05/2013  Elsevier Interactive Patient Education ©2016 [employer]

## 2015-08-18 NOTE — ED Notes (Signed)
[facility] but I had no luck.

## 2015-08-18 NOTE — ED Provider Notes (Signed)
[facility] Emergency Department Provider Note  ____________________________________________    I have reviewed the triage vital signs and the nursing notes.   HISTORY  Chief Complaint Abdominal Pain    HPI Tammy Frost is a 35 y.o. female who presents with complaints of right upper quadrant abdominal pain. Patient was scheduled for gallbladder surgery on May 23 but was unable to go because she had an upper respiratory infection.She has not rescheduled. She comes in today with moderate upper abdominal cramping pain which she attributes to her gallbladder because it feels the same as it always does. She does not drink alcohol. She denies nausea or vomiting. She reports the pain is already improving because she took pain medication at home.     Past Medical History  Diagnosis Date  . Anxiety   . UTI (urinary tract infection) 07-20-15  . Gallstones     Patient Active Problem List   Diagnosis Date Noted  . Cholelithiasis 07/22/2015    Past Surgical History  Procedure Laterality Date  . Cesarean section  12/27/2011  . Cesarean section      Current Outpatient Rx  Name  Route  Sig  Dispense  Refill  . acetaminophen (TYLENOL) 325 MG tablet   Oral   Take 650 mg by mouth every 6 (six) hours as needed.         Marland Kitchen. HYDROcodone-acetaminophen (NORCO/VICODIN) 5-325 MG tablet   Oral   Take 1-2 tablets by mouth every 4 (four) hours as needed for moderate pain.   30 tablet   0     Allergies Flagyl and Sulfa antibiotics  No family history on file.  Social History Social History  Substance Use Topics  . Smoking status: Never Smoker   . Smokeless tobacco: None  . Alcohol Use: No    Review of Systems  Constitutional: Negative for fever. Eyes: Negative for redness ENT: Negative for sore throat Cardiovascular: Negative for chest pain Respiratory: Negative for shortness of breath. Gastrointestinal: As above Genitourinary: Negative for  dysuria. Musculoskeletal: Negative for back pain. Skin: Negative for rash. Neurological: Negative for focal weakness Psychiatric: no anxiety    ____________________________________________   PHYSICAL EXAM:  VITAL SIGNS: ED Triage Vitals  Enc Vitals Group     BP 08/18/15 1058 135/80 mmHg     Pulse Rate 08/18/15 1058 91     Resp 08/18/15 1058 20     Temp 08/18/15 1058 98.6 F (37 C)     Temp Source 08/18/15 1058 Oral     SpO2 08/18/15 1058 98 %     Weight 08/18/15 1058 202 lb (91.627 kg)     Height 08/18/15 1058 5\' 2"  (1.575 m)     Head Cir --      Peak Flow --      Pain Score 08/18/15 1059 6     Pain Loc --      Pain Edu? --      Excl. in GC? --      Constitutional: Alert and oriented. Well appearing and in no distress.  Eyes: Conjunctivae are normal. No erythema or injection ENT   Head: Normocephalic and atraumatic.   Mouth/Throat: Mucous membranes are moist. Cardiovascular: Normal rate, regular rhythm. Normal and symmetric distal pulses are present in the upper extremities. No murmurs or rubs  Respiratory: Normal respiratory effort without tachypnea nor retractions. Breath sounds are clear and equal bilaterally.  Gastrointestinal: Soft and non-tender in all quadrants. No distention. There is no CVA tenderness.Benign abdominal  exam Genitourinary: deferred Musculoskeletal: Nontender with normal range of motion in all extremities. No lower extremity tenderness nor edema. Neurologic:  Normal speech and language. No gross focal neurologic deficits are appreciated. Skin:  Skin is warm, dry and intact. No rash noted. Psychiatric: Mood and affect are normal. Patient exhibits appropriate insight and judgment.  ____________________________________________    LABS (pertinent positives/negatives)  Labs Reviewed  COMPREHENSIVE METABOLIC PANEL - Abnormal; Notable for the following:    Potassium 3.2 (*)    CO2 20 (*)    Glucose, Bld 107 (*)    AST 143 (*)    ALT 347  (*)    All other components within normal limits  URINALYSIS COMPLETEWITH MICROSCOPIC ([facility] ONLY) - Abnormal; Notable for the following:    Color, Urine YELLOW (*)    APPearance HAZY (*)    Hgb urine dipstick 1+ (*)    Bacteria, UA RARE (*)    Squamous Epithelial / LPF 6-30 (*)    All other components within normal limits  LIPASE, BLOOD  CBC  POC URINE PREG, ED  POCT PREGNANCY, URINE    ____________________________________________   EKG  None  ____________________________________________    RADIOLOGY  None  ____________________________________________   PROCEDURES  Procedure(s) performed: none  Critical Care performed: none  ____________________________________________   INITIAL IMPRESSION / ASSESSMENT AND PLAN / ED COURSE  Pertinent labs & imaging results that were available during my care of the patient were reviewed by me and considered in my medical decision making (see chart for details).  Patient presents with rapidly improving right upper quadrant abdominal pain. She has no abdominal tenderness to palpation. Do not feel repeat ultrasound was necessary. We discussed her elevated LFTs and the need for follow-up of this. She will follow-up with her surgeon to reschedule her surgery and follow-up on her LFTs. She knows to return to emergency Department if any worsening of her symptoms.  ____________________________________________   FINAL CLINICAL IMPRESSION(S) / ED DIAGNOSES  Final diagnoses:  Elevated liver enzymes  Right upper quadrant pain          Jene Every, MD 08/18/15 1517

## 2015-08-26 ENCOUNTER — Encounter: Payer: Self-pay | Admitting: General Surgery

## 2015-08-26 ENCOUNTER — Ambulatory Visit (INDEPENDENT_AMBULATORY_CARE_PROVIDER_SITE_OTHER): Payer: Self-pay | Admitting: General Surgery

## 2015-08-26 VITALS — BP 128/89 | HR 85 | Temp 98.5°F | Ht 61.0 in | Wt 200.0 lb

## 2015-08-26 DIAGNOSIS — K8020 Calculus of gallbladder without cholecystitis without obstruction: Secondary | ICD-10-CM

## 2015-08-26 NOTE — Patient Instructions (Signed)
Your surgery will be on 09/02/2015 done by Dr. Marguerita Merlesatherine Loflin. Please refer to your blue sheet. However, if you have any questions you are more than welcome to give us a call.

## 2015-08-26 NOTE — Progress Notes (Signed)
Outpatient Surgical Follow Up  08/26/2015  Rossi M Jenne PaneBates is an 35 y.o. female.   Chief Complaint  Patient presents with  . Follow-up    ED Elevated LFT's    HPI: 35 year old female returns to clinic for follow-up for right upper quadrant pain. Patient had previously been scheduled for lap scopic cholecystectomy to be done by me approximately 1 month ago however she developed an upper respiratory infection and had to cancel her surgery. One week ago she had a recurrence of her right upper quadrant pain. Patient states this was worse than the previous attack and prompt her to go to the emergency department. The pain did gradually resolve and she was able to be discharged home from the emergency department. She is following up today to schedule surgery. She states since her attack she's continued to have some discomfort in the right upper quadrant that it is a dull ache that area but it is worsened if she eats fatty foods and stays dulls long as she avoids the fats. She denies any fevers, chills, nausea, vomiting, diarrhea, patient.  Past Medical History  Diagnosis Date  . Anxiety   . UTI (urinary tract infection) 07-20-15  . Gallstones     Past Surgical History  Procedure Laterality Date  . Cesarean section  12/27/2011  . Cesarean section      Family History  Problem Relation Age of Onset  . Cancer Maternal Grandmother     lung  . Emphysema Maternal Grandmother   . Cancer Maternal Grandfather     pancreatic  . Alcohol abuse Neg Hx   . Arthritis Neg Hx   . Asthma Neg Hx   . Birth defects Neg Hx   . COPD Neg Hx   . Depression Neg Hx   . Diabetes Neg Hx   . Drug abuse Neg Hx   . Early death Neg Hx   . Hearing loss Neg Hx   . Heart disease Neg Hx   . Hyperlipidemia Neg Hx   . Hypertension Neg Hx   . Kidney disease Neg Hx   . Learning disabilities Neg Hx   . Mental illness Neg Hx   . Mental retardation Neg Hx   . Miscarriages / Stillbirths Neg Hx   . Vision loss Neg Hx   .  Stroke Neg Hx   . Varicose Veins Neg Hx     Social History:  reports that she has never smoked. She does not have any smokeless tobacco history on file. She reports that she does not drink alcohol or use illicit drugs.  Allergies:  Allergies  Allergen Reactions  . Flagyl [Metronidazole] Hives and Rash    Patient states her skins burn.  . Sulfa Antibiotics Hives and Rash    Patient states her skin burns.    Medications reviewed.    ROS A multipoint review of systems was completed. All pertinent positives and negatives are documented in the history of present illness remainder negative.   BP 128/89 mmHg  Pulse 85  Temp(Src) 98.5 F (36.9 C) (Oral)  Ht 5\' 1"  (1.549 m)  Wt 90.719 kg (200 lb)  BMI 37.81 kg/m2  LMP 08/08/2015  Physical Exam Gen.: No acute distress Neck: Supple and nontender Chest: Clear to auscultation Heart: Regular rhythm Abdomen: Soft, nondistended, nontender.    No results found for this or any previous visit (from the past 48 hour(s)). No results found.  Assessment/Plan:  1. Calculus of gallbladder without cholecystitis without obstruction 35 year old female with  symptomatic biliary colic and evidence of potentially having passed a stone from her recent ER visit. A laparoscopic cholecystectomy was again discussed with the patient. I discussed the procedure in detail.  The patient was given educational material.  We discussed the risks and benefits of a laparoscopic cholecystectomy and possible cholangiogram including, but not limited to bleeding, infection, injury to surrounding structures such as the intestine or liver, bile leak, retained gallstones, need to convert to an open procedure, prolonged diarrhea, blood clots such as  DVT, common bile duct injury, anesthesia risks, and possible need for additional procedures.  The likelihood of improvement in symptoms and return to the patient's normal status is good. We discussed the typical post-operative  recovery course. Patient desires have surgery sooner than I can offer and therefore we will schedule with my partner Dr. Loflin for next week on the 20th.      Jaimi Belle, MD FACS General Surgeon  08/26/2015,4:17 PM   

## 2015-08-27 ENCOUNTER — Telehealth: Payer: Self-pay | Admitting: Surgery

## 2015-08-27 NOTE — Telephone Encounter (Signed)
Pt advised of pre op date/time and sx date. Sx: 09/02/15 with Dr Dow AdolphLoflin--Laparoscopic cholecystectomy.  Pre op: 08/29/15 between 9-1:00pm by Phone.   Patient made aware to call [phone], between 1-3:00pm the day before surgery, to find out what time to arrive.

## 2015-08-29 ENCOUNTER — Encounter: Payer: Self-pay | Admitting: *Deleted

## 2015-08-29 ENCOUNTER — Other Ambulatory Visit: Payer: Self-pay

## 2015-08-29 NOTE — Patient Instructions (Signed)
  Your procedure is scheduled on: 09/02/15 Report to Day Surgery. To find out your arrival time please call [phone] between 1PM - 3PM on 09/01/15.  Remember: Instructions that are not followed completely may result in serious medical risk, up to and including death, or upon the discretion of your surgeon and anesthesiologist your surgery may need to be rescheduled.    ___x_ 1. Do not eat food or drink liquids after midnight. No gum chewing or hard candies.     _x___ 2. No Alcohol for 24 hours before or after surgery.   ____ 3. Do Not Smoke For 24 Hours Prior to Your Surgery.   ____ 4. Bring all medications with you on the day of surgery if instructed.    ___x_ 5. Notify your doctor if there is any change in your medical condition     (cold, fever, infections).       Do not wear jewelry, make-up, hairpins, clips or nail polish.  Do not wear lotions, powders, or perfumes. You may wear deodorant.  Do not shave 48 hours prior to surgery. Men may shave face and neck.  Do not bring valuables to the hospital.    [facility] not [facility] for any belongings or valuables.               Contacts, dentures or bridgework may not be worn into surgery.  Leave your suitcase in the car. After surgery it may be brought to your room.  For patients admitted to the hospital, discharge time is determined by your                treatment team.   Patients discharged the day of surgery will not be allowed to drive home.   Please read over the following fact sheets that you were given:   Surgical Site Infection Prevention   __x__ Take these medicines the morning of surgery with A SIP OF WATER:    1. norco if needed  2.   3.   4.  5.  6.  ____ Fleet Enema (as directed)   __x__ Use CHG Soap as directed  ____ Use inhalers on the day of surgery  ____ Stop metformin 2 days prior to surgery    ____ Take 1/2 of usual insulin dose the night before surgery and none on the morning of  surgery.   ____ Stop Coumadin/Plavix/aspirin on   _x___ Stop Anti-inflammatories on today   ____ Stop supplements until after surgery.    ____ Bring C-Pap to the hospital.

## 2015-09-02 ENCOUNTER — Encounter: Payer: Self-pay | Admitting: *Deleted

## 2015-09-02 ENCOUNTER — Ambulatory Visit: Payer: Self-pay | Admitting: Anesthesiology

## 2015-09-02 ENCOUNTER — Ambulatory Visit
Admission: RE | Admit: 2015-09-02 | Discharge: 2015-09-02 | Disposition: A | Discharge status: Home / Self Care | Payer: Self-pay | Source: Ambulatory Visit | Attending: Surgery | Admitting: Surgery

## 2015-09-02 ENCOUNTER — Encounter
Admission: RE | Disposition: A | Discharge status: Home / Self Care | Payer: Self-pay | Source: Ambulatory Visit | Attending: Surgery

## 2015-09-02 DIAGNOSIS — F419 Anxiety disorder, unspecified: Secondary | ICD-10-CM | POA: Insufficient documentation

## 2015-09-02 DIAGNOSIS — K8020 Calculus of gallbladder without cholecystitis without obstruction: Secondary | ICD-10-CM

## 2015-09-02 DIAGNOSIS — E669 Obesity, unspecified: Secondary | ICD-10-CM | POA: Insufficient documentation

## 2015-09-02 DIAGNOSIS — Z882 Allergy status to sulfonamides status: Secondary | ICD-10-CM | POA: Insufficient documentation

## 2015-09-02 DIAGNOSIS — K8010 Calculus of gallbladder with chronic cholecystitis without obstruction: Secondary | ICD-10-CM | POA: Insufficient documentation

## 2015-09-02 HISTORY — PX: CHOLECYSTECTOMY: SHX55

## 2015-09-02 LAB — POCT PREGNANCY, URINE: Preg Test, Ur: NEGATIVE

## 2015-09-02 SURGERY — LAPAROSCOPIC CHOLECYSTECTOMY WITH INTRAOPERATIVE CHOLANGIOGRAM
Anesthesia: General | Site: Abdomen | Wound class: Contaminated

## 2015-09-02 MED ORDER — FENTANYL CITRATE (PF) 100 MCG/2ML IJ SOLN
25.0000 ug | INTRAMUSCULAR | Status: DC | PRN
  Administered 2015-09-02 (×4): 25 ug via INTRAVENOUS

## 2015-09-02 MED ORDER — ROCURONIUM BROMIDE 100 MG/10ML IV SOLN
INTRAVENOUS | Status: DC | PRN
  Administered 2015-09-02: 10 mg via INTRAVENOUS
  Administered 2015-09-02: 50 mg via INTRAVENOUS

## 2015-09-02 MED ORDER — KETOROLAC TROMETHAMINE 30 MG/ML IJ SOLN
INTRAMUSCULAR | Status: DC | PRN
  Administered 2015-09-02: 30 mg via INTRAVENOUS

## 2015-09-02 MED ORDER — CEFOXITIN SODIUM 1 G IV SOLR
1.0000 g | INTRAVENOUS | Status: AC
  Administered 2015-09-02: 1 g via INTRAVENOUS
  Filled 2015-09-02: qty 1

## 2015-09-02 MED ORDER — ACETAMINOPHEN 10 MG/ML IV SOLN
INTRAVENOUS | Status: AC
Start: 2015-09-02 — End: 2015-09-02
  Filled 2015-09-02: qty 100

## 2015-09-02 MED ORDER — DEXMEDETOMIDINE HCL IN NACL 200 MCG/50ML IV SOLN
INTRAVENOUS | Status: DC | PRN
  Administered 2015-09-02: 20 ug via INTRAVENOUS

## 2015-09-02 MED ORDER — BUPIVACAINE HCL (PF) 0.5 % IJ SOLN
INTRAMUSCULAR | Status: DC | PRN
  Administered 2015-09-02: 30 mL

## 2015-09-02 MED ORDER — DEXAMETHASONE SODIUM PHOSPHATE 10 MG/ML IJ SOLN
INTRAMUSCULAR | Status: DC | PRN
  Administered 2015-09-02: 10 mg via INTRAVENOUS

## 2015-09-02 MED ORDER — OXYCODONE-ACETAMINOPHEN 5-325 MG PO TABS: 1.0000 | ORAL_TABLET | ORAL | Status: DC | PRN

## 2015-09-02 MED ORDER — GLYCOPYRROLATE 0.2 MG/ML IJ SOLN
INTRAMUSCULAR | Status: DC | PRN
  Administered 2015-09-02: 0.2 mg via INTRAVENOUS

## 2015-09-02 MED ORDER — SUGAMMADEX SODIUM 200 MG/2ML IV SOLN
INTRAVENOUS | Status: DC | PRN
  Administered 2015-09-02: 200 mg via INTRAVENOUS

## 2015-09-02 MED ORDER — ONDANSETRON HCL 4 MG/2ML IJ SOLN
INTRAMUSCULAR | Status: DC | PRN
  Administered 2015-09-02: 4 mg via INTRAVENOUS

## 2015-09-02 MED ORDER — FAMOTIDINE 20 MG PO TABS
ORAL_TABLET | ORAL | Status: AC
  Filled 2015-09-02: qty 1

## 2015-09-02 MED ORDER — FENTANYL CITRATE (PF) 100 MCG/2ML IJ SOLN
INTRAMUSCULAR | Status: DC | PRN
  Administered 2015-09-02: 100 ug via INTRAVENOUS
  Administered 2015-09-02 (×2): 50 ug via INTRAVENOUS

## 2015-09-02 MED ORDER — ACETAMINOPHEN 10 MG/ML IV SOLN
INTRAVENOUS | Status: DC | PRN
  Administered 2015-09-02: 1000 mg via INTRAVENOUS

## 2015-09-02 MED ORDER — LACTATED RINGERS IV SOLN
INTRAVENOUS | Status: DC
  Administered 2015-09-02 (×2): via INTRAVENOUS

## 2015-09-02 MED ORDER — SODIUM CHLORIDE 0.9 % IJ SOLN
INTRAMUSCULAR | Status: AC
  Filled 2015-09-02: qty 50

## 2015-09-02 MED ORDER — FENTANYL CITRATE (PF) 100 MCG/2ML IJ SOLN
INTRAMUSCULAR | Status: AC
  Administered 2015-09-02: 25 ug via INTRAVENOUS
  Filled 2015-09-02: qty 2

## 2015-09-02 MED ORDER — LIDOCAINE HCL (CARDIAC) 20 MG/ML IV SOLN
INTRAVENOUS | Status: DC | PRN
  Administered 2015-09-02: 100 mg via INTRAVENOUS

## 2015-09-02 MED ORDER — BUPIVACAINE HCL (PF) 0.5 % IJ SOLN
INTRAMUSCULAR | Status: AC
  Filled 2015-09-02: qty 30

## 2015-09-02 MED ORDER — ONDANSETRON HCL 4 MG/2ML IJ SOLN: 4.0000 mg | Freq: Once | INTRAMUSCULAR | Status: DC | PRN

## 2015-09-02 MED ORDER — PROPOFOL 10 MG/ML IV BOLUS
INTRAVENOUS | Status: DC | PRN
  Administered 2015-09-02: 150 mg via INTRAVENOUS

## 2015-09-02 MED ORDER — FAMOTIDINE 20 MG PO TABS
20.0000 mg | ORAL_TABLET | Freq: Once | ORAL | Status: AC
  Administered 2015-09-02: 20 mg via ORAL

## 2015-09-02 MED ORDER — MIDAZOLAM HCL 2 MG/2ML IJ SOLN
INTRAMUSCULAR | Status: DC | PRN
  Administered 2015-09-02: 2 mg via INTRAVENOUS

## 2015-09-02 SURGICAL SUPPLY — 41 items
APPLIER CLIP 5 13 M/L LIGAMAX5 (MISCELLANEOUS) ×2
APR CLP MED LRG 5 ANG JAW (MISCELLANEOUS) ×1
BLADE SURG 15 STRL LF DISP TIS (BLADE) ×1 IMPLANT
BLADE SURG 15 STRL SS (BLADE) ×2
CANISTER SUCT 1200ML W/VALVE (MISCELLANEOUS) ×2 IMPLANT
CATH CHOLANGI 4FR 420404F (CATHETERS) IMPLANT
CHLORAPREP W/TINT 26ML (MISCELLANEOUS) ×2 IMPLANT
CLIP APPLIE 5 13 M/L LIGAMAX5 (MISCELLANEOUS) ×1 IMPLANT
CONRAY 60ML FOR OR (MISCELLANEOUS) ×2 IMPLANT
DEFOGGER SCOPE WARMER CLEARIFY (MISCELLANEOUS) ×2 IMPLANT
DRAPE SHEET LG 3/4 BI-LAMINATE (DRAPES) ×2 IMPLANT
ELECT CAUTERY BLADE 6.4 (BLADE) ×2 IMPLANT
ELECT E-Z MONOPOLAR 33 (MISCELLANEOUS) ×2
ELECT REM PT RETURN 9FT ADLT (ELECTROSURGICAL) ×2
ELECTRODE E-Z MONOPOLAR 33 (MISCELLANEOUS) ×1 IMPLANT
ELECTRODE REM PT RTRN 9FT ADLT (ELECTROSURGICAL) ×1 IMPLANT
ENDOPOUCH RETRIEVER 10 (MISCELLANEOUS) ×2 IMPLANT
GLOVE PI ORTHOPRO 6.5 (GLOVE) ×1
GLOVE PI ORTHOPRO STRL 6.5 (GLOVE) ×1 IMPLANT
GOWN STRL REUS W/ TWL LRG LVL3 (GOWN DISPOSABLE) ×3 IMPLANT
GOWN STRL REUS W/TWL LRG LVL3 (GOWN DISPOSABLE) ×3
IRRIGATION STRYKERFLOW (MISCELLANEOUS) ×1 IMPLANT
IRRIGATOR STRYKERFLOW (MISCELLANEOUS) ×2
IV CATH ANGIO 12GX3 LT BLUE (NEEDLE) ×2 IMPLANT
IV NS 1000ML (IV SOLUTION) ×2
IV NS 1000ML BAXH (IV SOLUTION) ×1 IMPLANT
LABEL OR SOLS (LABEL) ×2 IMPLANT
LIQUID BAND (GAUZE/BANDAGES/DRESSINGS) ×2 IMPLANT
NEEDLE HYPO 25X1 1.5 SAFETY (NEEDLE) ×2 IMPLANT
NS IRRIG 500ML POUR BTL (IV SOLUTION) ×2 IMPLANT
PACK LAP CHOLECYSTECTOMY (MISCELLANEOUS) ×2 IMPLANT
PENCIL ELECTRO HAND CTR (MISCELLANEOUS) ×2 IMPLANT
SCISSORS METZENBAUM CVD 33 (INSTRUMENTS) ×2 IMPLANT
SLEEVE ENDOPATH XCEL 5M (ENDOMECHANICALS) ×4 IMPLANT
SUT MNCRL 4-0 (SUTURE) ×2
SUT MNCRL 4-0 27XMFL (SUTURE) ×1
SUT VICRYL 0 AB UR-6 (SUTURE) ×4 IMPLANT
SUTURE MNCRL 4-0 27XMF (SUTURE) ×1 IMPLANT
TROCAR XCEL BLUNT TIP 100MML (ENDOMECHANICALS) ×2 IMPLANT
TROCAR XCEL NON-BLD 5MMX100MML (ENDOMECHANICALS) ×2 IMPLANT
TUBING INSUFFLATOR HI FLOW (MISCELLANEOUS) ×2 IMPLANT

## 2015-09-02 NOTE — Progress Notes (Signed)
Negative urine pregnancy test.

## 2015-09-02 NOTE — OR Nursing (Signed)
Pt ready for dc, waiting on ride home.

## 2015-09-02 NOTE — Transfer of Care (Signed)
Immediate Anesthesia Transfer of Care Note  Patient: Tammy Frost  Procedure(s) Performed: Procedure(s): LAPAROSCOPIC CHOLECYSTECTOMY WITH INTRAOPERATIVE CHOLANGIOGRAM (N/A)  Patient Location: PACU  Anesthesia Type:General  Level of Consciousness: sedated  Airway & Oxygen Therapy: Patient Spontanous Breathing and Patient connected to face mask oxygen  Post-op Assessment: Report given to RN and Post -op Vital signs reviewed and stable  Post vital signs: Reviewed and stable  Last Vitals:  Filed Vitals:   09/02/15 1004 09/02/15 1235  BP: 132/87 107/70  Pulse: 102 94  Temp: 36.1 C 36.2 C  Resp: 16 22    Complications: No apparent anesthesia complications

## 2015-09-02 NOTE — H&P (View-Only) (Signed)
Outpatient Surgical Follow Up  08/26/2015  Rossi M Jenne PaneBates is an 35 y.o. female.   Chief Complaint  Patient presents with  . Follow-up    ED Elevated LFT's    HPI: 35 year old female returns to clinic for follow-up for right upper quadrant pain. Patient had previously been scheduled for lap scopic cholecystectomy to be done by me approximately 1 month ago however she developed an upper respiratory infection and had to cancel her surgery. One week ago she had a recurrence of her right upper quadrant pain. Patient states this was worse than the previous attack and prompt her to go to the emergency department. The pain did gradually resolve and she was able to be discharged home from the emergency department. She is following up today to schedule surgery. She states since her attack she's continued to have some discomfort in the right upper quadrant that it is a dull ache that area but it is worsened if she eats fatty foods and stays dulls long as she avoids the fats. She denies any fevers, chills, nausea, vomiting, diarrhea, patient.  Past Medical History  Diagnosis Date  . Anxiety   . UTI (urinary tract infection) 07-20-15  . Gallstones     Past Surgical History  Procedure Laterality Date  . Cesarean section  12/27/2011  . Cesarean section      Family History  Problem Relation Age of Onset  . Cancer Maternal Grandmother     lung  . Emphysema Maternal Grandmother   . Cancer Maternal Grandfather     pancreatic  . Alcohol abuse Neg Hx   . Arthritis Neg Hx   . Asthma Neg Hx   . Birth defects Neg Hx   . COPD Neg Hx   . Depression Neg Hx   . Diabetes Neg Hx   . Drug abuse Neg Hx   . Early death Neg Hx   . Hearing loss Neg Hx   . Heart disease Neg Hx   . Hyperlipidemia Neg Hx   . Hypertension Neg Hx   . Kidney disease Neg Hx   . Learning disabilities Neg Hx   . Mental illness Neg Hx   . Mental retardation Neg Hx   . Miscarriages / Stillbirths Neg Hx   . Vision loss Neg Hx   .  Stroke Neg Hx   . Varicose Veins Neg Hx     Social History:  reports that she has never smoked. She does not have any smokeless tobacco history on file. She reports that she does not drink alcohol or use illicit drugs.  Allergies:  Allergies  Allergen Reactions  . Flagyl [Metronidazole] Hives and Rash    Patient states her skins burn.  . Sulfa Antibiotics Hives and Rash    Patient states her skin burns.    Medications reviewed.    ROS A multipoint review of systems was completed. All pertinent positives and negatives are documented in the history of present illness remainder negative.   BP 128/89 mmHg  Pulse 85  Temp(Src) 98.5 F (36.9 C) (Oral)  Ht 5\' 1"  (1.549 m)  Wt 90.719 kg (200 lb)  BMI 37.81 kg/m2  LMP 08/08/2015  Physical Exam Gen.: No acute distress Neck: Supple and nontender Chest: Clear to auscultation Heart: Regular rhythm Abdomen: Soft, nondistended, nontender.    No results found for this or any previous visit (from the past 48 hour(s)). No results found.  Assessment/Plan:  1. Calculus of gallbladder without cholecystitis without obstruction 35 year old female with  symptomatic biliary colic and evidence of potentially having passed a stone from her recent ER visit. A laparoscopic cholecystectomy was again discussed with the patient. I discussed the procedure in detail.  The patient was given Agricultural engineer.  We discussed the risks and benefits of a laparoscopic cholecystectomy and possible cholangiogram including, but not limited to bleeding, infection, injury to surrounding structures such as the intestine or liver, bile leak, retained gallstones, need to convert to an open procedure, prolonged diarrhea, blood clots such as  DVT, common bile duct injury, anesthesia risks, and possible need for additional procedures.  The likelihood of improvement in symptoms and return to the patient's normal status is good. We discussed the typical post-operative  recovery course. Patient desires have surgery sooner than I can offer and therefore we will schedule with my partner Dr. Orvis Brill for next week on the 20th.      Ricarda Frame, MD FACS General Surgeon  08/26/2015,4:17 PM

## 2015-09-02 NOTE — Interval H&P Note (Signed)
History and Physical Interval Note:  09/02/2015 10:35 AM  Tammy Frost  has presented today for surgery, with the diagnosis of CALCULUS OF GALLBLADDER  The various methods of treatment have been discussed with the patient and family. After consideration of risks, benefits and other options for treatment, the patient has consented to  Procedure(s): LAPAROSCOPIC CHOLECYSTECTOMY WITH INTRAOPERATIVE CHOLANGIOGRAM (N/A) as a surgical intervention .  The patient's history has been reviewed, patient examined, no change in status, stable for surgery.  I have reviewed the patient's chart and labs.  Questions were answered to the patient's satisfaction.     Simpson Paulos L Noami Bove

## 2015-09-02 NOTE — Op Note (Signed)
Laparoscopic Cholecystectomy Procedure Note  Indications: This patient presents with symptomatic gallbladder disease and will undergo laparoscopic cholecystectomy.  Pre-operative Diagnosis: Calculus of gallbladder without mention of cholecystitis or obstruction  Post-operative Diagnosis: Same  Surgeon: Gladis Riffleatherine L Tarique Loveall   Assistants: none  Anesthesia: General endotracheal anesthesia  ASA Class: 2  Procedure Details  The patient was seen again in the Holding Room. The risks, benefits, complications, treatment options, and expected outcomes were discussed with the patient. The possibilities of reaction to medication, pulmonary aspiration, perforation of viscus, bleeding, recurrent infection, finding a normal gallbladder, the need for additional procedures, failure to diagnose a condition, the possible need to convert to an open procedure, and creating a complication requiring transfusion or operation were discussed with the patient. The patient and/or family concurred with the proposed plan, giving informed consent. The site of surgery properly noted/marked. The patient was taken to Operating Room, identified as Tammy Frost and the procedure verified as Laparoscopic Cholecystectomy with Intraoperative Cholangiograms. A Time Out was held and the above information confirmed.  Prior to the induction of general anesthesia, antibiotic prophylaxis was administered. General endotracheal anesthesia was then administered and tolerated well. After the induction, the abdomen was prepped in the usual sterile fashion. The patient was positioned in the supine position with the left arm comfortably tucked, along with some reverse Trendelenburg.  Local anesthetic agent was injected into the skin near the umbilicus and an incision made. The midline fascia was incised and the Hasson technique was used to introduce a 10 mm port under direct vision. It was secured with two figure of eight Vicryl sutures placed in  the usual fashion. Pneumoperitoneum was then created with CO2 and tolerated well without any adverse changes in the patient's vital signs. Additional trocars were introduced under direct vision. All skin incisions were infiltrated with a local anesthetic agent before making the incision and placing the trocars.   The gallbladder was identified, the fundus grasped and retracted cephalad. Adhesions were lysed bluntly and with the electrocautery where indicated, taking care not to injure any adjacent organs or viscus. The infundibulum was grasped and retracted laterally, exposing the peritoneum overlying the triangle of Calot. This was then divided and exposed in a blunt fashion. The cystic duct was clearly identified and bluntly dissected circumferentially. The junctions of the gallbladder, cystic duct and common bile duct were clearly identified prior to the division of any linear structure.  The cystic duct was then doubly ligated with surgical clips on the patient side and singly clipped on the gallbladder side and divided. The cystic artery was identified, dissected free, ligated with clips and divided as well.   The gallbladder was dissected from the liver bed in retrograde fashion with the electrocautery. The gallbladder was removed. The liver bed was irrigated and inspected. Hemostasis was achieved with the electrocautery. Copious irrigation was utilized and was repeatedly aspirated until clear all particulate matter.  Pneumoperitoneum was completely reduced after viewing removal of the trocars under direct vision. The wound was thoroughly irrigated and the fascia was then closed with a figure of eight suture; the skin was then closed with 4-0 Monocryl and a sterile glue was applied.  Instrument, sponge, and needle counts were correct at closure and at the conclusion of the case.   Findings: Cholecystitis with Cholelithiasis  Estimated Blood Loss: less than 50 mL         Drains:none          Total IV Fluids: 1000mL  Specimens: Gallbladder           Complications: None; patient tolerated the procedure well.         Disposition: PACU - hemodynamically stable.         Condition: stable

## 2015-09-02 NOTE — Anesthesia Procedure Notes (Signed)
Procedure Name: Intubation Date/Time: 09/02/2015 11:01 AM Performed by: Stormy FabianURTIS, Jash Wahlen Pre-anesthesia Checklist: Patient identified, Patient being monitored, Timeout performed, Emergency Drugs available and Suction available Patient Re-evaluated:Patient Re-evaluated prior to inductionOxygen Delivery Method: Circle system utilized Preoxygenation: Pre-oxygenation with 100% oxygen Intubation Type: IV induction Ventilation: Mask ventilation without difficulty Laryngoscope Size: Mac and 3 Grade View: Grade I Tube type: Oral Tube size: 7.0 mm Number of attempts: 1 Airway Equipment and Method: Stylet Placement Confirmation: ETT inserted through vocal cords under direct vision,  positive ETCO2 and breath sounds checked- equal and bilateral Secured at: 21 cm Tube secured with: Tape Dental Injury: Teeth and Oropharynx as per pre-operative assessment

## 2015-09-02 NOTE — Discharge Instructions (Signed)

## 2015-09-02 NOTE — Anesthesia Preprocedure Evaluation (Signed)
Anesthesia Evaluation  Patient identified by MRN, date of birth, ID band Patient awake    Reviewed: Allergy & Precautions, NPO status , Patient's Chart, lab work & pertinent test results, reviewed documented beta blocker date and time   Airway Mallampati: II  TM Distance: >3 FB     Dental  (+) Chipped   Pulmonary           Cardiovascular      Neuro/Psych Anxiety    GI/Hepatic   Endo/Other    Renal/GU      Musculoskeletal   Abdominal   Peds  Hematology   Anesthesia Other Findings Obese.  Reproductive/Obstetrics                             Anesthesia Physical Anesthesia Plan  ASA: II  Anesthesia Plan: General   Post-op Pain Management:    Induction: Intravenous  Airway Management Planned: Oral ETT  Additional Equipment:   Intra-op Plan:   Post-operative Plan:   Informed Consent: I have reviewed the patients History and Physical, chart, labs and discussed the procedure including the risks, benefits and alternatives for the proposed anesthesia with the patient or authorized representative who has indicated his/her understanding and acceptance.     Plan Discussed with: CRNA  Anesthesia Plan Comments:         Anesthesia Quick Evaluation

## 2015-09-03 LAB — SURGICAL PATHOLOGY

## 2015-09-03 NOTE — Anesthesia Postprocedure Evaluation (Signed)
Anesthesia Post Note  Patient: Tammy Frost  Procedure(s) Performed: Procedure(s) (LRB): LAPAROSCOPIC CHOLECYSTECTOMY WITH INTRAOPERATIVE CHOLANGIOGRAM (N/A)  Patient location during evaluation: PACU Anesthesia Type: General Level of consciousness: awake and alert Pain management: pain level controlled Vital Signs Assessment: post-procedure vital signs reviewed and stable Respiratory status: spontaneous breathing and respiratory function stable Cardiovascular status: stable Anesthetic complications: no    Last Vitals:  Filed Vitals:   09/02/15 1329 09/02/15 1422  BP: 129/82 104/61  Pulse: 90 85  Temp: 36.7 C   Resp: 16 16    Last Pain:  Filed Vitals:   09/02/15 1423  PainSc: 0-No pain                 Hildegard Hlavac K

## 2015-09-05 ENCOUNTER — Telehealth: Payer: Self-pay | Admitting: Surgery

## 2015-09-05 NOTE — Telephone Encounter (Signed)
Oxycodon is making her nauseated. She is taking it with food. She feels it is to strong for her. Please call.

## 2015-09-05 NOTE — Telephone Encounter (Signed)
Patient stated the oxycodone was making her sick. I asked if she had tried taking half the tablet and was told yes. She asked about taking just ibuprofen or Tylenol instead and just stop the Oxycodone all together. She was told yes, however she could not combine with Oxycodone. Patient stated she was not going to take the Oxycodone anymore .

## 2015-09-08 ENCOUNTER — Encounter: Payer: Self-pay | Admitting: Emergency Medicine

## 2015-09-08 ENCOUNTER — Emergency Department: Payer: Self-pay

## 2015-09-08 ENCOUNTER — Emergency Department
Admission: EM | Admit: 2015-09-08 | Discharge: 2015-09-08 | Disposition: A | Discharge status: Home / Self Care | Payer: Self-pay | Attending: Emergency Medicine | Admitting: Emergency Medicine

## 2015-09-08 DIAGNOSIS — Z8719 Personal history of other diseases of the digestive system: Secondary | ICD-10-CM | POA: Insufficient documentation

## 2015-09-08 DIAGNOSIS — R1011 Right upper quadrant pain: Secondary | ICD-10-CM | POA: Insufficient documentation

## 2015-09-08 DIAGNOSIS — E876 Hypokalemia: Secondary | ICD-10-CM | POA: Insufficient documentation

## 2015-09-08 DIAGNOSIS — Z79899 Other long term (current) drug therapy: Secondary | ICD-10-CM | POA: Insufficient documentation

## 2015-09-08 DIAGNOSIS — K0889 Other specified disorders of teeth and supporting structures: Secondary | ICD-10-CM | POA: Insufficient documentation

## 2015-09-08 DIAGNOSIS — R630 Anorexia: Secondary | ICD-10-CM | POA: Insufficient documentation

## 2015-09-08 DIAGNOSIS — R110 Nausea: Secondary | ICD-10-CM | POA: Insufficient documentation

## 2015-09-08 LAB — LIPASE, BLOOD: Lipase: 18 U/L (ref 11–51)

## 2015-09-08 LAB — COMPREHENSIVE METABOLIC PANEL
ALBUMIN: 4.3 g/dL (ref 3.5–5.0)
ALK PHOS: 59 U/L (ref 38–126)
ALT: 27 U/L (ref 14–54)
ANION GAP: 8 (ref 5–15)
AST: 18 U/L (ref 15–41)
BILIRUBIN TOTAL: 0.6 mg/dL (ref 0.3–1.2)
CHLORIDE: 103 mmol/L (ref 101–111)
CO2: 25 mmol/L (ref 22–32)
Calcium: 9.2 mg/dL (ref 8.9–10.3)
Creatinine, Ser: 0.74 mg/dL (ref 0.44–1.00)
GFR calc Af Amer: >60 mL/min (ref >60)
GFR calc non Af Amer: >60 mL/min (ref >60)
GLUCOSE: 104 mg/dL — AB (ref 65–99)
POTASSIUM: 3.2 mmol/L — AB (ref 3.5–5.1)
Sodium: 136 mmol/L (ref 135–145)
TOTAL PROTEIN: 8.2 g/dL — AB (ref 6.5–8.1)

## 2015-09-08 LAB — CBC
HEMATOCRIT: 37.2 % (ref 35.0–47.0)
HEMOGLOBIN: 12.9 g/dL (ref 12.0–16.0)
MCH: 28 pg (ref 26.0–34.0)
MCHC: 34.7 g/dL (ref 32.0–36.0)
MCV: 80.7 fL (ref 80.0–100.0)
Platelets: 221 10*3/uL (ref 150–440)
RBC: 4.61 MIL/uL (ref 3.80–5.20)
RDW: 13.7 % (ref 11.5–14.5)
WBC: 9.8 10*3/uL (ref 3.6–11.0)

## 2015-09-08 LAB — URINALYSIS COMPLETE WITH MICROSCOPIC (ARMC ONLY)
BACTERIA UA: NONE SEEN
Bilirubin Urine: NEGATIVE
GLUCOSE, UA: NEGATIVE mg/dL
HGB URINE DIPSTICK: NEGATIVE
Ketones, ur: NEGATIVE mg/dL
Leukocytes, UA: NEGATIVE
NITRITE: NEGATIVE
PH: 7 (ref 5.0–8.0)
Protein, ur: NEGATIVE mg/dL
RBC / HPF: NONE SEEN RBC/hpf (ref 0–5)
SQUAMOUS EPITHELIAL / LPF: NONE SEEN
Specific Gravity, Urine: 1.001 — ABNORMAL LOW (ref 1.005–1.030)
WBC UA: NONE SEEN WBC/hpf (ref 0–5)

## 2015-09-08 LAB — POCT PREGNANCY, URINE: Preg Test, Ur: NEGATIVE

## 2015-09-08 MED ORDER — IOPAMIDOL (ISOVUE-300) INJECTION 61%
100.0000 mL | Freq: Once | INTRAVENOUS | Status: AC | PRN
  Administered 2015-09-08: 100 mL via INTRAVENOUS

## 2015-09-08 MED ORDER — ONDANSETRON 4 MG PO TBDP: 4.0000 mg | ORAL_TABLET | Freq: Three times a day (TID) | ORAL | Status: DC | PRN

## 2015-09-08 MED ORDER — SODIUM CHLORIDE 0.9 % IV BOLUS (SEPSIS)
1000.0000 mL | Freq: Once | INTRAVENOUS | Status: AC
  Administered 2015-09-08: 1000 mL via INTRAVENOUS

## 2015-09-08 MED ORDER — DIATRIZOATE MEGLUMINE & SODIUM 66-10 % PO SOLN
15.0000 mL | ORAL | Status: AC
  Administered 2015-09-08: 30 mL via ORAL

## 2015-09-08 MED ORDER — HYDROMORPHONE HCL 1 MG/ML IJ SOLN
1.0000 mg | Freq: Once | INTRAMUSCULAR | Status: DC
  Filled 2015-09-08: qty 1

## 2015-09-08 MED ORDER — PENICILLIN V POTASSIUM 500 MG PO TABS: 500.0000 mg | ORAL_TABLET | Freq: Four times a day (QID) | ORAL | Status: DC

## 2015-09-08 MED ORDER — POTASSIUM CHLORIDE CRYS ER 20 MEQ PO TBCR
40.0000 meq | EXTENDED_RELEASE_TABLET | Freq: Once | ORAL | Status: AC
  Administered 2015-09-08: 40 meq via ORAL
  Filled 2015-09-08: qty 2

## 2015-09-08 MED ORDER — IBUPROFEN 600 MG PO TABS: 600.0000 mg | ORAL_TABLET | Freq: Three times a day (TID) | ORAL | Status: DC | PRN

## 2015-09-08 MED ORDER — DIATRIZOATE MEGLUMINE & SODIUM 66-10 % PO SOLN
15.0000 mL | ORAL | Status: AC
  Administered 2015-09-08 (×2): 15 mL via ORAL

## 2015-09-08 MED ORDER — ONDANSETRON HCL 4 MG/2ML IJ SOLN
4.0000 mg | Freq: Once | INTRAMUSCULAR | Status: AC
  Administered 2015-09-08: 4 mg via INTRAVENOUS
  Filled 2015-09-08: qty 2

## 2015-09-08 NOTE — Discharge Instructions (Signed)
Please return to the emergency department if you develop severe pain, fever, inability to keep down fluids, or any other symptoms concerning to you.

## 2015-09-08 NOTE — ED Notes (Signed)
AAOx3.  Skin warm and dry.  NAD 

## 2015-09-08 NOTE — ED Notes (Signed)
Pt presents to ED with reports of RUQ abdominal pain. Pt states she had a laparoscopic cholycystectomy performed last week. Pt states she did not have much post-operative pain until yesterday. Pt describes pain as stabbing. Pt reports nausea but denies vomiting. Pt reports diarrhea. Pt denies fever.

## 2015-09-08 NOTE — ED Provider Notes (Signed)
[facility] Emergency Department Provider Note  ____________________________________________  Time seen: Approximately 11:36 AM  I have reviewed the triage vital signs and the nursing notes.   HISTORY  Chief Complaint Abdominal Pain    HPI Tammy Frost is a 35 y.o. female s/p lap chole 6 days ago presenting with right upper quadrant pain, nausea. The patient reports that she was doing well postoperatively until yesterday when she developed a sharp right-sided pain that is worse with positional changes. She has had some nausea without any vomiting. She has not had any known fevers. She has no concerns about her incisional sites. No urinary symptoms. Patient has been able to eat and drink without difficulty, and has had normal bowel movements without diarrhea or constipation.   Past Medical History  Diagnosis Date  . Anxiety   . UTI (urinary tract infection) 07-20-15  . Gallstones     Patient Active Problem List   Diagnosis Date Noted  . Chronic cholecystitis with calculus   . Cholelithiasis 07/22/2015    Past Surgical History  Procedure Laterality Date  . Cesarean section  12/27/2011  . Cesarean section    . Cholecystectomy N/A 09/02/2015    Procedure: LAPAROSCOPIC CHOLECYSTECTOMY WITH INTRAOPERATIVE CHOLANGIOGRAM;  Surgeon: Gladis Riffle, MD;  Location: [facility] ORS;  Service: General;  Laterality: N/A;    Current Outpatient Rx  Name  Route  Sig  Dispense  Refill  . acetaminophen (TYLENOL) 500 MG tablet   Oral   Take 500 mg by mouth every 6 (six) hours as needed for moderate pain.         Marland Kitchen oxyCODONE-acetaminophen (ROXICET) 5-325 MG tablet   Oral   Take 1-2 tablets by mouth every 4 (four) hours as needed for severe pain.   30 tablet   0     Allergies Flagyl and Sulfa antibiotics  Family History  Problem Relation Age of Onset  . Cancer Maternal Grandmother     lung  . Emphysema Maternal Grandmother   . Cancer Maternal Grandfather      pancreatic  . Alcohol abuse Neg Hx   . Arthritis Neg Hx   . Asthma Neg Hx   . Birth defects Neg Hx   . COPD Neg Hx   . Depression Neg Hx   . Diabetes Neg Hx   . Drug abuse Neg Hx   . Early death Neg Hx   . Hearing loss Neg Hx   . Heart disease Neg Hx   . Hyperlipidemia Neg Hx   . Hypertension Neg Hx   . Kidney disease Neg Hx   . Learning disabilities Neg Hx   . Mental illness Neg Hx   . Mental retardation Neg Hx   . Miscarriages / Stillbirths Neg Hx   . Vision loss Neg Hx   . Stroke Neg Hx   . Varicose Veins Neg Hx     Social History Social History  Substance Use Topics  . Smoking status: Never Smoker   . Smokeless tobacco: Never Used  . Alcohol Use: No    Review of Systems Constitutional: No fever/chills.No lightheadedness or syncope. Eyes: No visual changes. ENT: No sore throat. No congestion or rhinorrhea. Cardiovascular: Denies chest pain. Denies palpitations. Respiratory: Denies shortness of breath.  No cough. Gastrointestinal: Right upper quadrant abdominal pain.  Positive nausea, no vomiting.  No diarrhea.  No constipation. Genitourinary: Negative for dysuria. Musculoskeletal: Negative for back pain. Skin: Negative for rash. Neurological: Negative for headaches. No focal numbness,  tingling or weakness.   10-point ROS otherwise negative.  ____________________________________________   PHYSICAL EXAM:  VITAL SIGNS: ED Triage Vitals  Enc Vitals Group     BP 09/08/15 0817 132/86 mmHg     Pulse Rate 09/08/15 0817 92     Resp 09/08/15 0817 16     Temp 09/08/15 0817 99.1 F (37.3 C)     Temp Source 09/08/15 0817 Oral     SpO2 09/08/15 0817 99 %     Weight 09/08/15 0817 200 lb (90.719 kg)     Height 09/08/15 0817 5\' 1"  (1.549 m)     Head Cir --      Peak Flow --      Pain Score 09/08/15 0817 5     Pain Loc --      Pain Edu? --      Excl. in GC? --     Constitutional: Alert and oriented. Well appearing and in no acute distress. Answers questions  appropriately. Eyes: Conjunctivae are normal.  EOMI. No scleral icterus. Head: Atraumatic. Nose: No congestion/rhinnorhea. Mouth/Throat: Mucous membranes are moist.  Neck: No stridor.  Supple.  No meningismus. Cardiovascular: Normal rate, regular rhythm. No murmurs, rubs or gallops.  Respiratory: Normal respiratory effort.  No accessory muscle use or retractions. Lungs CTAB.  No wheezes, rales or ronchi. Gastrointestinal: Obese. Soft and nondistended.  Mild tenderness to palpation in the right lateral upper quadrant. Patient has multiple 1 cm laparoscopic incisions that are clean dry and intact without any discharge. She has multiple areas of bruising over the anterior abdomen which are consistent with prior Lovenox shots. No guarding or rebound.  No peritoneal signs. Musculoskeletal: No LE edema.  Neurologic:  A&Ox3.  Speech is clear.  Face and smile are symmetric.  EOMI.  Moves all extremities well. Skin:  Skin is warm, dry and intact. No rash noted. Psychiatric: Mood and affect are normal. Speech and behavior are normal.  Normal judgement  ____________________________________________   LABS (all labs ordered are listed, but only abnormal results are displayed)  Labs Reviewed  COMPREHENSIVE METABOLIC PANEL - Abnormal; Notable for the following:    Potassium 3.2 (*)    Glucose, Bld 104 (*)    BUN <5 (*)    Total Protein 8.2 (*)    All other components within normal limits  URINALYSIS COMPLETEWITH MICROSCOPIC ([facility] ONLY) - Abnormal; Notable for the following:    Color, Urine COLORLESS (*)    APPearance CLEAR (*)    Specific Gravity, Urine 1.001 (*)    All other components within normal limits  LIPASE, BLOOD  CBC  POCT PREGNANCY, URINE   ____________________________________________  EKG  Not indicated ____________________________________________  RADIOLOGY  Ct Abdomen Pelvis W Contrast  09/08/2015  CLINICAL DATA:  Right upper quadrant abdominal pain EXAM: CT ABDOMEN AND  PELVIS WITH CONTRAST TECHNIQUE: Multidetector CT imaging of the abdomen and pelvis was performed using the standard protocol following bolus administration of intravenous contrast. CONTRAST:  100mL ISOVUE-300 IOPAMIDOL (ISOVUE-300) INJECTION 61% COMPARISON:  01/21/2010 FINDINGS: Lower chest and abdominal wall: There are linear areas of fat edema in the abdominal wall correlating with recent laparoscopic surgery. No fluid collection or soft tissue emphysema. Hepatobiliary: No focal liver abnormality.Recent cholecystectomy. No fluid collection, perihepatic ascites or bile duct enlargement. Pancreas: Unremarkable. Spleen: Unremarkable. Adrenals/Urinary Tract: Negative adrenals. No hydronephrosis or stone. Unremarkable bladder. Stomach/Bowel:  No obstruction. No appendicitis. Reproductive:No pathologic findings. Vascular/Lymphatic: No acute vascular abnormality. No mass or adenopathy. Other: Small non loculated pelvic fluid  which could be reactive or physiologic. No perihepatic ascites typical of bile leak. Musculoskeletal: No acute finding. IMPRESSION: Recent laparoscopic cholecystectomy. No acute or unexpected finding. Electronically Signed   By: Marnee SpringJonathon  Watts M.D.   On: 09/08/2015 14:39    ____________________________________________   PROCEDURES  Procedure(s) performed: None  Critical Care performed: No ____________________________________________   INITIAL IMPRESSION / ASSESSMENT AND PLAN / ED COURSE  Pertinent labs & imaging results that were available during my care of the patient were reviewed by me and considered in my medical decision making (see chart for details).  35 y.o. female with recent laparoscopic cholecystectomy presenting with new onset of right upper quadrant pain. The patient here has a temperature of 99.1. Given her recent surgery, will get CT scan to evaluate for any signs of infection. In the meantime, I will initiate symptomatically  treatment.  ----------------------------------------- 3:25 PM on 09/08/2015 -----------------------------------------  The patient's pain has significantly improved and she is able to tolerate by mouth. Her CT scan does not show any complications from her surgery. She continues to have normal vital signs. She is also stating that she now feels like she is getting a dental infection and she has no appointment on July 7. I will initiate her on penicillin as an outpatient and have her follow-up. Plan discharge.  ____________________________________________  FINAL CLINICAL IMPRESSION(S) / ED DIAGNOSES  Final diagnoses:  Hypokalemia  Right upper quadrant pain  Nausea without vomiting  Decreased appetite  Pain, dental      NEW MEDICATIONS STARTED DURING THIS VISIT:  New Prescriptions   No medications on file     Rockne MenghiniAnne-Caroline Dayle Sherpa, MD 09/08/15 1526

## 2015-09-09 ENCOUNTER — Telehealth: Payer: Self-pay

## 2015-09-09 NOTE — Telephone Encounter (Signed)
Patient's husband's FMLA Form was filled out and faxed to [employer] (HR Administrator).

## 2015-09-18 ENCOUNTER — Ambulatory Visit (INDEPENDENT_AMBULATORY_CARE_PROVIDER_SITE_OTHER): Payer: Self-pay | Admitting: Surgery

## 2015-09-18 ENCOUNTER — Encounter: Payer: Self-pay | Admitting: Surgery

## 2015-09-18 VITALS — BP 141/82 | HR 77 | Temp 98.1°F | Ht 62.0 in | Wt 201.0 lb

## 2015-09-18 DIAGNOSIS — Z09 Encounter for follow-up examination after completed treatment for conditions other than malignant neoplasm: Secondary | ICD-10-CM

## 2015-09-18 NOTE — Progress Notes (Signed)
6/20 s/p lap chole Doing well  Some occasional shooting pains in the right upper quadrant there are controlled with NSAIDs + PO, + BM  PE NAD Abd: soft, NT, incisions c/d/i, no infection\  A/P doing well Path d/w pt  No heavy lifting RTC prn

## 2015-09-18 NOTE — Patient Instructions (Addendum)
Please call us if you have any concerns.  

## 2015-12-24 ENCOUNTER — Encounter: Payer: Self-pay | Admitting: Emergency Medicine

## 2015-12-24 ENCOUNTER — Emergency Department: Payer: Self-pay

## 2015-12-24 ENCOUNTER — Emergency Department
Admission: EM | Admit: 2015-12-24 | Discharge: 2015-12-24 | Disposition: A | Discharge status: Home / Self Care | Payer: Self-pay | Attending: Emergency Medicine | Admitting: Emergency Medicine

## 2015-12-24 DIAGNOSIS — J40 Bronchitis, not specified as acute or chronic: Secondary | ICD-10-CM | POA: Insufficient documentation

## 2015-12-24 MED ORDER — AZITHROMYCIN 250 MG PO TABS: ORAL_TABLET | ORAL | 0 refills | Status: DC

## 2015-12-24 MED ORDER — BENZONATATE 100 MG PO CAPS: 100.0000 mg | ORAL_CAPSULE | Freq: Three times a day (TID) | ORAL | 0 refills | Status: AC | PRN

## 2015-12-24 NOTE — ED Provider Notes (Signed)
[facility] Emergency Department Provider Note  ____________________________________________   First MD Initiated Contact with Patient 12/24/15 [phone]     (approximate)  I have reviewed the triage vital signs and the nursing notes.   HISTORY  Chief Complaint Cough   HPI Tammy Frost is a 35 y.o. female is here with complaint of nonproductive cough for 2 weeks. Patient states occasionally she has had fever but denies any chills. Occasionally her cough is productive and is sort of whitish yellow. She also complains of sore throat. She denies any ear pain, headaches, nausea or vomiting. Patient has occasionally taken over-the-counter medication without any improvement. She denies any previous history of bronchitis, pneumonia or smoking. Currently she rates her pain as 7/10.   Past Medical History:  Diagnosis Date  . Anxiety   . Gallstones   . UTI (urinary tract infection) 07-20-15    Patient Active Problem List   Diagnosis Date Noted  . Chronic cholecystitis with calculus   . Cholelithiasis 07/22/2015    Past Surgical History:  Procedure Laterality Date  . CESAREAN SECTION  12/27/2011  . CESAREAN SECTION  12/31/2013  . CHOLECYSTECTOMY N/A 09/02/2015   Procedure: LAPAROSCOPIC CHOLECYSTECTOMY WITH INTRAOPERATIVE CHOLANGIOGRAM;  Surgeon: Gladis Riffleatherine L Loflin, MD;  Location: [facility] ORS;  Service: General;  Laterality: N/A;    Prior to Admission medications   Medication Sig Start Date End Date Taking? Authorizing Provider  azithromycin (ZITHROMAX Z-PAK) 250 MG tablet Take 2 tablets (500 mg) on  Day 1,  followed by 1 tablet (250 mg) once daily on Days 2 through 5. 12/24/15   Tommi Rumpshonda L Yenny Kosa, PA-C  benzonatate (TESSALON PERLES) 100 MG capsule Take 1 capsule (100 mg total) by mouth 3 (three) times daily as needed for cough. 12/24/15 12/23/16  Tommi Rumpshonda L Praise Stennett, PA-C    Allergies Flagyl [metronidazole] and Sulfa antibiotics  Family History  Problem Relation  Age of Onset  . Cancer Maternal Grandmother     lung  . Emphysema Maternal Grandmother   . Cancer Maternal Grandfather     pancreatic  . Hypertension Mother   . Alcohol abuse Neg Hx   . Arthritis Neg Hx   . Asthma Neg Hx   . Birth defects Neg Hx   . COPD Neg Hx   . Depression Neg Hx   . Diabetes Neg Hx   . Drug abuse Neg Hx   . Early death Neg Hx   . Hearing loss Neg Hx   . Heart disease Neg Hx   . Hyperlipidemia Neg Hx   . Kidney disease Neg Hx   . Learning disabilities Neg Hx   . Mental illness Neg Hx   . Mental retardation Neg Hx   . Miscarriages / Stillbirths Neg Hx   . Vision loss Neg Hx   . Stroke Neg Hx   . Varicose Veins Neg Hx     Social History Social History  Substance Use Topics  . Smoking status: Never Smoker  . Smokeless tobacco: Never Used  . Alcohol use No    Review of Systems Constitutional: Positive fever/negative chills Eyes: No visual changes. ENT: Positive sore throat. Cardiovascular: Denies chest pain. Respiratory: Denies shortness of breath. Gastrointestinal: No abdominal pain.  No nausea, no vomiting.  No diarrhea.   Genitourinary: Negative for dysuria. Musculoskeletal: Negative for back pain. Skin: Negative for rash. Neurological: Negative for headaches, focal weakness or numbness.  10-point ROS otherwise negative.  ____________________________________________   PHYSICAL EXAM:  VITAL SIGNS: ED  Triage Vitals  Enc Vitals Group     BP 12/24/15 0839 107/79     Pulse Rate 12/24/15 0839 83     Resp 12/24/15 0839 15     Temp 12/24/15 0839 98.7 F (37.1 C)     Temp Source 12/24/15 0839 Oral     SpO2 12/24/15 0839 100 %     Weight 12/24/15 0825 201 lb (91.2 kg)     Height 12/24/15 0825 5\' 2"  (1.575 m)     Head Circumference --      Peak Flow --      Pain Score 12/24/15 0825 7     Pain Loc --      Pain Edu? --      Excl. in GC? --     Constitutional: Alert and oriented. Well appearing and in no acute distress. Eyes:  Conjunctivae are normal. PERRL. EOMI. Head: Atraumatic. Nose: Mild congestion/rhinnorhea.  EACs are clear. TMs are dull bilaterally but no erythema or injection. Mouth/Throat: Mucous membranes are moist.  Oropharynx non-erythematous. Neck: No stridor.   Hematological/Lymphatic/Immunilogical: No cervical lymphadenopathy. Cardiovascular: Normal rate, regular rhythm. Grossly normal heart sounds.  Good peripheral circulation. Respiratory: Normal respiratory effort.  No retractions. Lungs CTAB.  Coarse cough. Gastrointestinal: Soft and nontender. No distention.  Musculoskeletal: No lower extremity tenderness nor edema.  No joint effusions. Neurologic:  Normal speech and language. No gross focal neurologic deficits are appreciated. No gait instability. Skin:  Skin is warm, dry and intact. No rash noted. Psychiatric: Mood and affect are normal. Speech and behavior are normal.  ____________________________________________   LABS (all labs ordered are listed, but only abnormal results are displayed)  Labs Reviewed - No data to display RADIOLOGY  Chest x-ray per radiologist is negative. I, Tommi Rumps, personally viewed and evaluated these images (plain radiographs) as part of my medical decision making, as well as reviewing the written report by the radiologist. ____________________________________________   PROCEDURES  Procedure(s) performed: None  Procedures  Critical Care performed: No  ____________________________________________   INITIAL IMPRESSION / ASSESSMENT AND PLAN / ED COURSE  Pertinent labs & imaging results that were available during my care of the patient were reviewed by me and considered in my medical decision making (see chart for details).   Clinical Course  Patient is placed on Zithromax as directed along with Tessalon Perles as needed for cough. She is to increase fluids. She'll follow-up with [facility] clinic if any continued  problems.   ____________________________________________   FINAL CLINICAL IMPRESSION(S) / ED DIAGNOSES  Final diagnoses:  Bronchitis      NEW MEDICATIONS STARTED DURING THIS VISIT:  Discharge Medication List as of 12/24/2015 10:01 AM    START taking these medications   Details  azithromycin (ZITHROMAX Z-PAK) 250 MG tablet Take 2 tablets (500 mg) on  Day 1,  followed by 1 tablet (250 mg) once daily on Days 2 through 5., Print    benzonatate (TESSALON PERLES) 100 MG capsule Take 1 capsule (100 mg total) by mouth 3 (three) times daily as needed for cough., Starting Wed 12/24/2015, Until Thu 12/23/2016, Print         Note:  This document was prepared using Dragon voice recognition software and may include unintentional dictation errors.    Tommi Rumps, PA-C 12/24/15 1011    Governor Rooks, MD 12/24/15 1256

## 2015-12-24 NOTE — Discharge Instructions (Signed)
Follow-up with [facility] if any continued problems or doctor of your choice. Zithromax as directed until completely finished. Tessalon Perles as needed for cough. Increase fluids.

## 2015-12-24 NOTE — ED Triage Notes (Addendum)
Pt to ed with c/o cough, sore throat, mild fever x 2 weeks. Productive sputum white and yellow in color.

## 2015-12-24 NOTE — ED Notes (Signed)
Cough for about 2 weeks occasional fever   And stats cough is occasionally prod  No resp distress noted on arrival

## 2016-06-02 ENCOUNTER — Ambulatory Visit: Payer: Self-pay

## 2016-06-14 ENCOUNTER — Ambulatory Visit: Payer: Self-pay | Attending: Oncology

## 2016-06-14 VITALS — BP 124/70 | HR 68 | Temp 97.0°F | Ht 62.0 in | Wt 209.0 lb

## 2016-06-14 DIAGNOSIS — N644 Mastodynia: Secondary | ICD-10-CM

## 2016-06-14 NOTE — Progress Notes (Signed)
Subjective:     Patient ID: Tammy Frost, female   DOB: 07-10-1980, 36 y.o.   MRN: [id number]  HPI   Review of Systems     Objective:   Physical Exam  Pulmonary/Chest: Right breast exhibits no inverted nipple, no mass, no nipple discharge, no skin change and no tenderness. Left breast exhibits no inverted nipple, no mass, no nipple discharge, no skin change and no tenderness. Breasts are symmetrical.       Assessment:     36 year old patient presents for [facility] clinic visit. Complains of left breast knot.   Patient screened, and meets BCCCP eligibility.  Patient does not have insurance, Medicare or Medicaid.  Handout given on Affordable Care Act. Instructed patient on breast self-exam using teach back method.   Patient reports she felt a tender mobile "knot" in lower left breast a week before her menstrual cycle. Unable to palpate today.   CBE unremarkable.  No mass or lump palpated.      Plan:     Joellyn Quails to schedule 3 month follow-up to evaluate.  Patient to return to clinic earlier if she has any concerns.

## 2016-06-16 NOTE — Progress Notes (Addendum)
Patient to return 09/20/16 for 3 month follow-up CBE for left breast mass.  No mass palpated today, and patient cannot identify area of concern.  Copy to [facility].

## 2016-09-20 ENCOUNTER — Ambulatory Visit: Payer: Self-pay | Attending: Oncology

## 2016-10-09 IMAGING — US US ABDOMEN LIMITED
1 series · 14 of 25 positions shown · non-contrast
Comparison: Ultrasound 10/23/2013.

CLINICAL DATA: Patient with severe right upper quadrant epigastric
pain for 5 days.

EXAM:
US ABDOMEN LIMITED - RIGHT UPPER QUADRANT

[Series 1: us abdomen limited · 0.25mm/px · 14 of 60 slices shown]
[im 1/60]
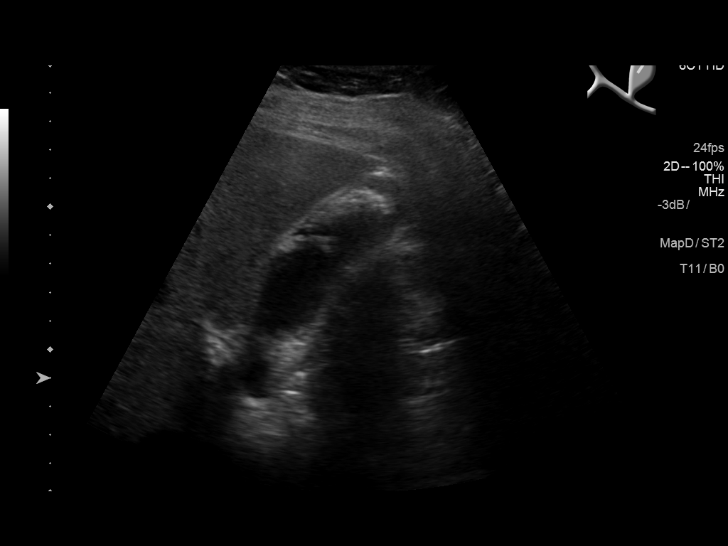
[im 5/60]
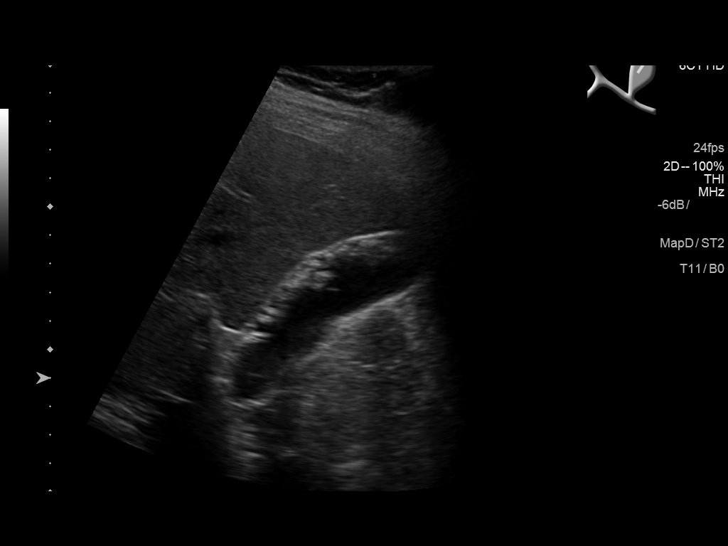
[im 10/60]
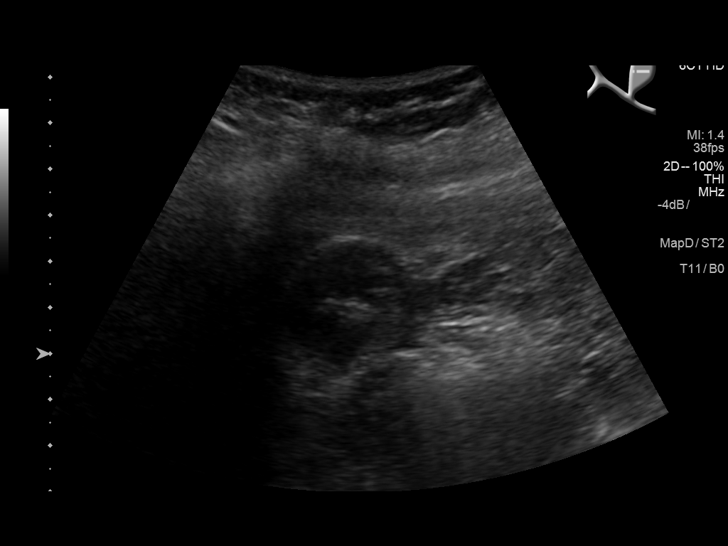
[im 15/60]
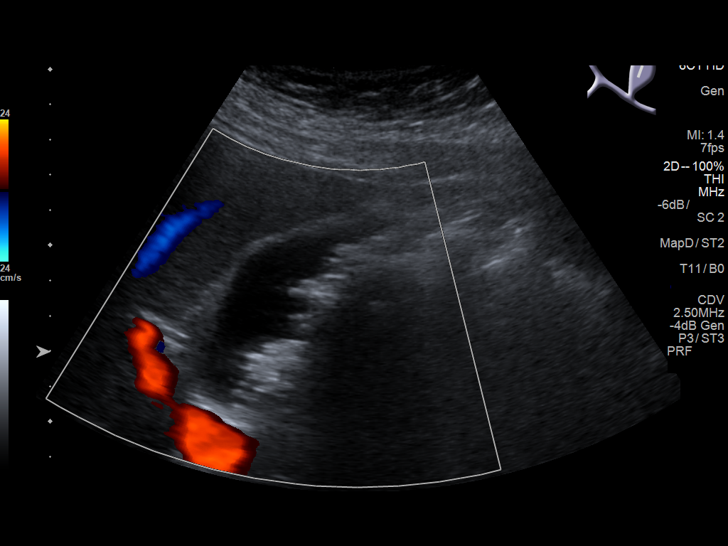
[im 20/60]
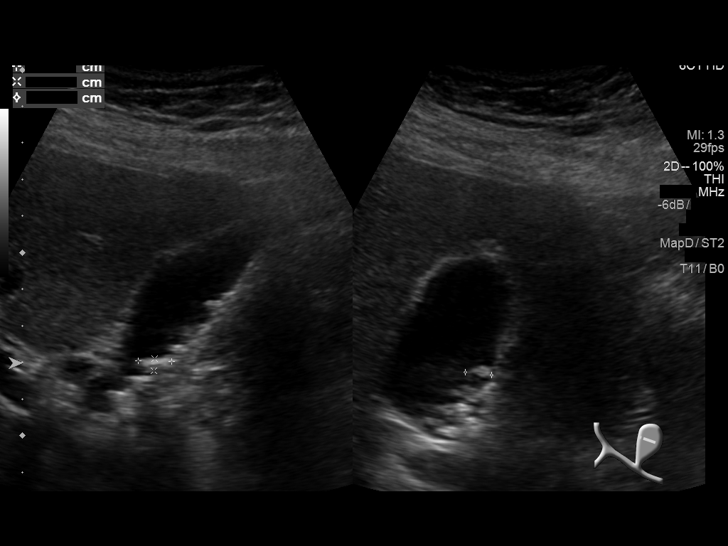
[im 23/60]
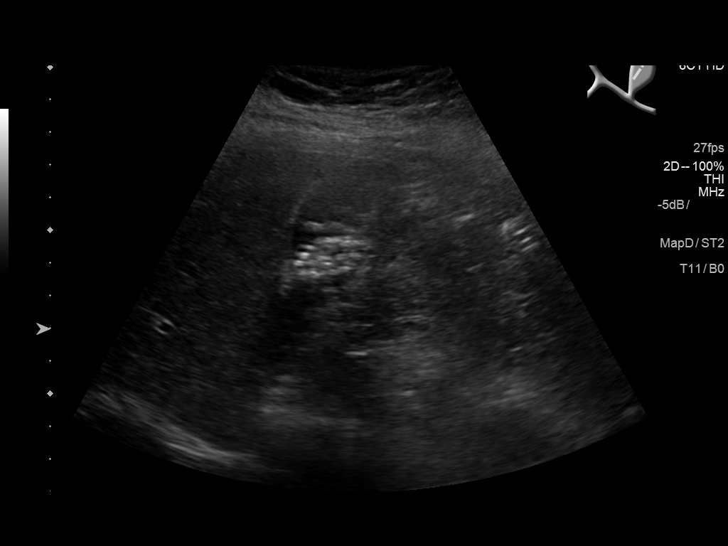
[im 28/60]
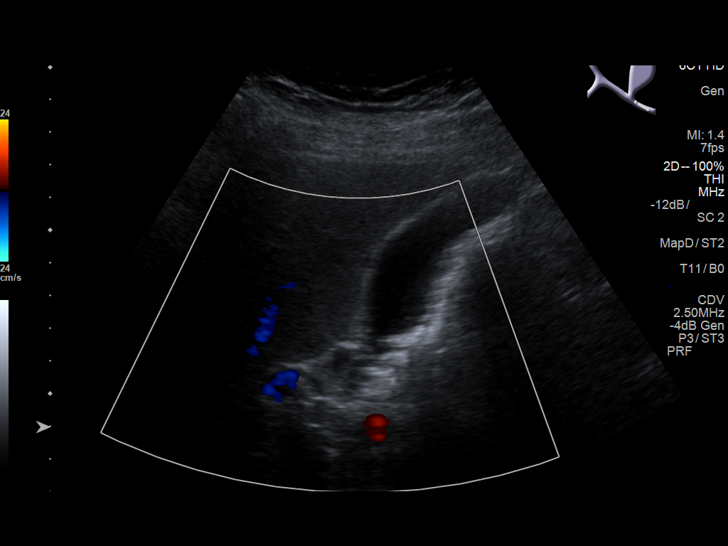
[im 32/60]
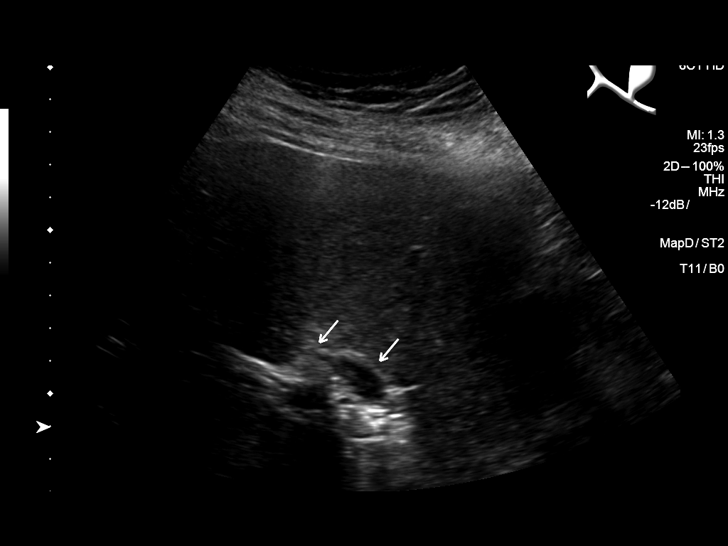
[im 37/60]
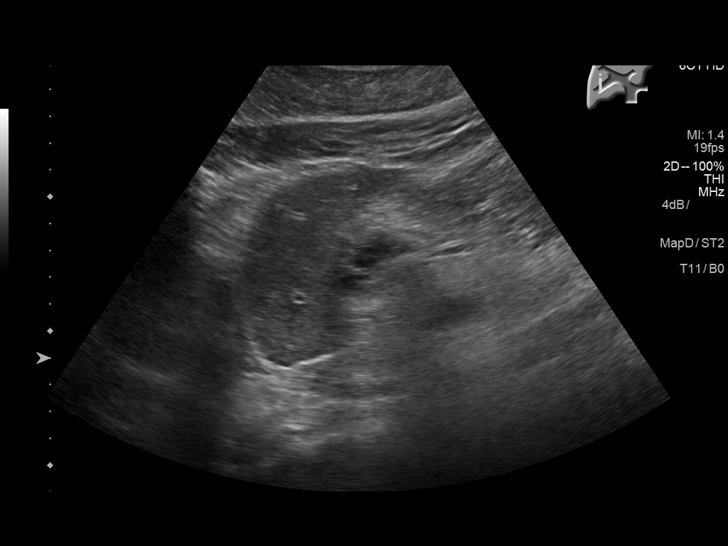
[im 40/60]
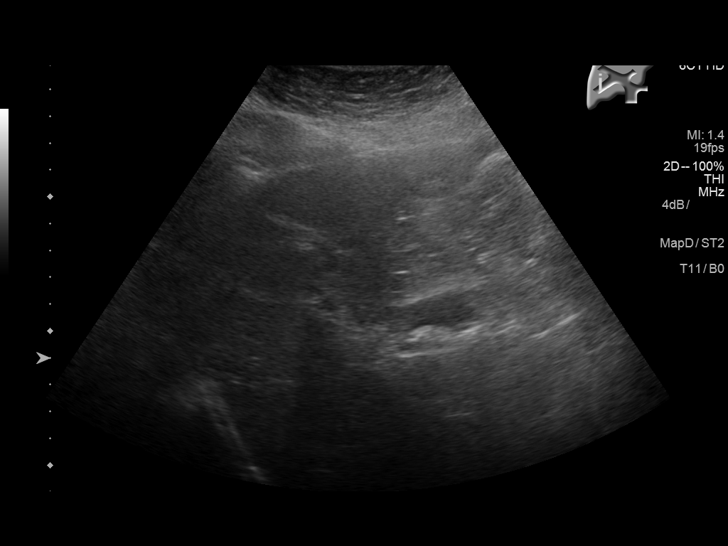
[im 45/60]
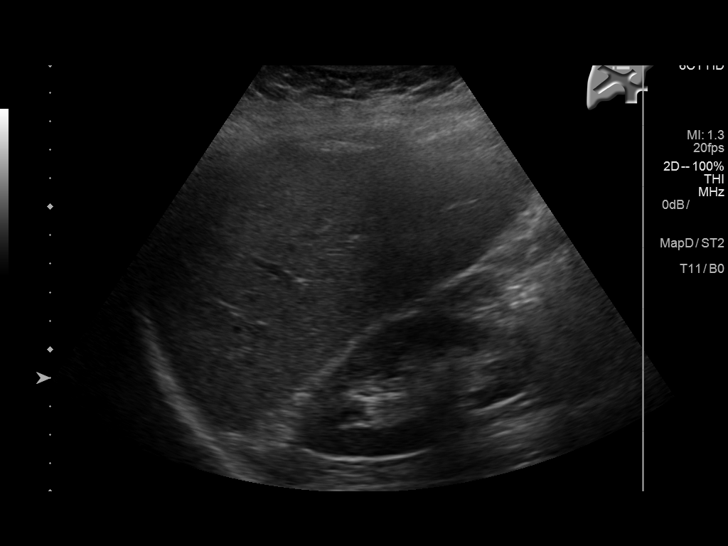
[im 50/60]
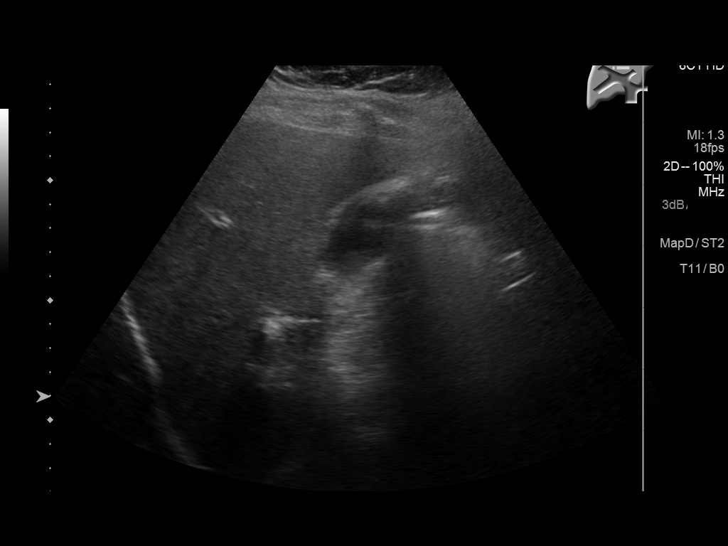
[im 55/60]
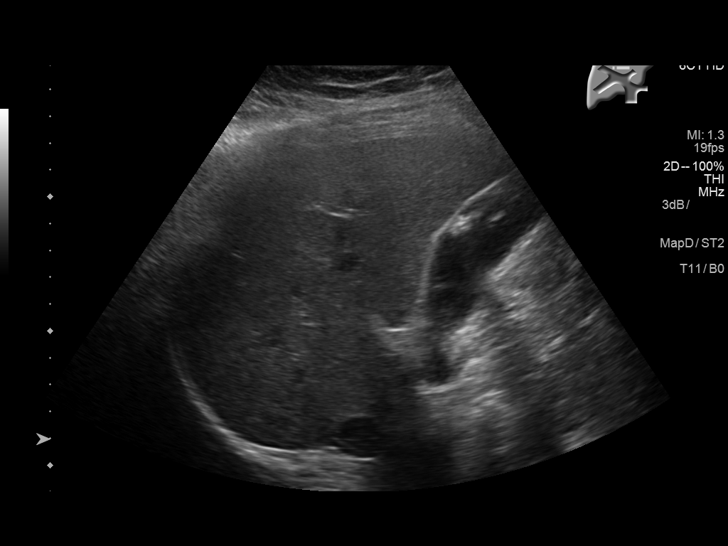
[im 60/60]
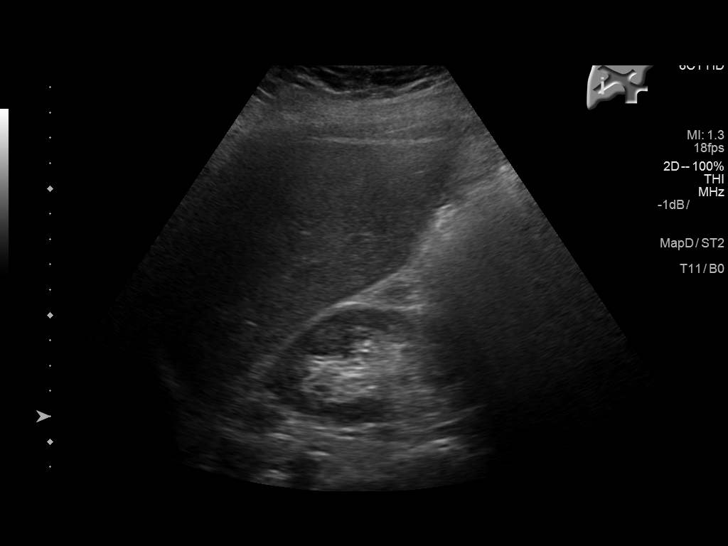

[14 of 25 positions shown; findings below may reference images not displayed]

FINDINGS: Gallbladder:

Multiple gallstones are demonstrated within the gallbladder lumen.
The majority of these are mobile. There appears to be at least 2
stones which are nonmobile and located within the gallbladder neck.
No gallbladder wall thickening or pericholecystic fluid. Negative
sonographic Murphy's sign.

Common bile duct:

Diameter: 3 mm

Liver:

No focal lesion identified. Within normal limits in parenchymal
echogenicity.
IMPRESSION: Multiple stones are demonstrated within the gallbladder lumen. At
least two of these stones appear to be impacted within the
gallbladder neck. No gallbladder wall thickening or pericholecystic
fluid to suggest acute cholecystitis.

## 2016-11-10 ENCOUNTER — Encounter: Payer: Self-pay | Admitting: Emergency Medicine

## 2016-11-10 ENCOUNTER — Emergency Department
Admission: EM | Admit: 2016-11-10 | Discharge: 2016-11-10 | Disposition: A | Discharge status: Home / Self Care | Payer: Self-pay | Attending: Emergency Medicine | Admitting: Emergency Medicine

## 2016-11-10 DIAGNOSIS — L02231 Carbuncle of abdominal wall: Secondary | ICD-10-CM | POA: Insufficient documentation

## 2016-11-10 DIAGNOSIS — L739 Follicular disorder, unspecified: Secondary | ICD-10-CM

## 2016-11-10 MED ORDER — TRAMADOL HCL 50 MG PO TABS: 50.0000 mg | ORAL_TABLET | Freq: Four times a day (QID) | ORAL | 0 refills | Status: DC | PRN

## 2016-11-10 MED ORDER — CEPHALEXIN 500 MG PO CAPS: 500.0000 mg | ORAL_CAPSULE | Freq: Four times a day (QID) | ORAL | 0 refills | Status: AC

## 2016-11-10 NOTE — ED Triage Notes (Addendum)
Patient presents to ED via POV from home with c/o lower abdominal abscess. Patient denies history of having an abscess. Patient denies any drainage. Redness noted encircling area.

## 2016-11-10 NOTE — ED Provider Notes (Signed)
[facility] Emergency Department Provider Note   ____________________________________________   First MD Initiated Contact with Patient 11/10/16 1335     (approximate)  I have reviewed the triage vital signs and the nursing notes.   HISTORY  Chief Complaint Abscess    HPI Tammy Frost is a 36 y.o. female patient presents with a papular lesion on erythematous base lower abdomen for 2 days. Patient has history of shaving of the area.Patient rates pain as 8/10. Patient described a pain as "achy". No palliative measures for complaint.   Past Medical History:  Diagnosis Date  . Anxiety   . Gallstones   . UTI (urinary tract infection) 07-20-15    Patient Active Problem List   Diagnosis Date Noted  . Chronic cholecystitis with calculus   . Cholelithiasis 07/22/2015    Past Surgical History:  Procedure Laterality Date  . CESAREAN SECTION  12/27/2011  . CESAREAN SECTION  12/31/2013  . CHOLECYSTECTOMY N/A 09/02/2015   Procedure: LAPAROSCOPIC CHOLECYSTECTOMY WITH INTRAOPERATIVE CHOLANGIOGRAM;  Surgeon: Gladis Riffle, MD;  Location: [facility] ORS;  Service: General;  Laterality: N/A;    Prior to Admission medications   Medication Sig Start Date End Date Taking? Authorizing Provider  azithromycin (ZITHROMAX Z-PAK) 250 MG tablet Take 2 tablets (500 mg) on  Day 1,  followed by 1 tablet (250 mg) once daily on Days 2 through 5. 12/24/15   Tommi Rumps, PA-C  benzonatate (TESSALON PERLES) 100 MG capsule Take 1 capsule (100 mg total) by mouth 3 (three) times daily as needed for cough. 12/24/15 12/23/16  Tommi Rumps, PA-C  cephALEXin (KEFLEX) 500 MG capsule Take 1 capsule (500 mg total) by mouth 4 (four) times daily. 11/10/16 11/20/16  Joni Reining, PA-C  traMADol (ULTRAM) 50 MG tablet Take 1 tablet (50 mg total) by mouth every 6 (six) hours as needed for moderate pain. 11/10/16   Joni Reining, PA-C    Allergies   Family History  Problem  Relation Age of Onset  . Cancer Maternal Grandmother        lung  . Emphysema Maternal Grandmother   . Cancer Maternal Grandfather        pancreatic  . Hypertension Mother   . Alcohol abuse Neg Hx   . Arthritis Neg Hx   . Asthma Neg Hx   . Birth defects Neg Hx   . COPD Neg Hx   . Depression Neg Hx   . Diabetes Neg Hx   . Drug abuse Neg Hx   . Early death Neg Hx   . Hearing loss Neg Hx   . Heart disease Neg Hx   . Hyperlipidemia Neg Hx   . Kidney disease Neg Hx   . Learning disabilities Neg Hx   . Mental illness Neg Hx   . Mental retardation Neg Hx   . Miscarriages / Stillbirths Neg Hx   . Vision loss Neg Hx   . Stroke Neg Hx   . Varicose Veins Neg Hx     Social History Social History  Substance Use Topics  . Smoking status: Never Smoker  . Smokeless tobacco: Never Used  . Alcohol use No    Review of Systems  Constitutional: No fever/chills Eyes: No visual changes. ENT: No sore throat. Cardiovascular: Denies chest pain. Respiratory: Denies shortness of breath. Gastrointestinal: No abdominal pain.  No nausea, no vomiting.  No diarrhea.  No constipation. Genitourinary: Negative for dysuria. Musculoskeletal: Negative for back pain. Skin: Negative for  rash. Papular lesion on abdomen Neurological: Negative for headaches, focal weakness or numbness. Psychiatric:Anxiety  ____________________________________________   PHYSICAL EXAM:  VITAL SIGNS: ED Triage Vitals [11/10/16 1249]  Enc Vitals Group     BP (!) 157/94     Pulse Rate 87     Resp 15     Temp 98.5 F (36.9 C)     Temp Source Oral     SpO2 99 %     Weight 188 lb (85.3 kg)     Height 5\' 2"  (1.575 m)     Head Circumference      Peak Flow      Pain Score 8     Pain Loc      Pain Edu?      Excl. in GC?     Constitutional: Alert and oriented. Well appearing and in no acute distress. Cardiovascular: Normal rate, regular rhythm. Grossly normal heart sounds.  Good peripheral  circulation. Respiratory: Normal respiratory effort.  No retractions. Lungs CTAB. Neurologic:  Normal speech and language. No gross focal neurologic deficits are appreciated. No gait instability. Skin:  Skin is warm, dry and intact. No rash noted.Papular lesion on erythematous base. Psychiatric: Mood and affect are normal. Speech and behavior are normal.  ____________________________________________   LABS (all labs ordered are listed, but only abnormal results are displayed)  Labs Reviewed - No data to display ____________________________________________  EKG   ____________________________________________  RADIOLOGY  No results found.  ____________________________________________   PROCEDURES  Procedure(s) performed: None  Procedures  Critical Care performed: No  ____________________________________________   INITIAL IMPRESSION / ASSESSMENT AND PLAN / ED COURSE  Pertinent labs & imaging results that were available during my care of the patient were reviewed by me and considered in my medical decision making (see chart for details).  General infection secondary to folliculitis. Area was surgical clean and use a skin refrigerant an 18-gauge needle papular lesion was ruptured. Mild amount appropriate material was expressed. Patient reclean and bandage. Patient given discharge care instructions and advised take medication as directed. Patient advised follow with the parents in [facility]    ____________________________________________   FINAL CLINICAL IMPRESSION(S) / ED DIAGNOSES  Final diagnoses:  Folliculitis      NEW MEDICATIONS STARTED DURING THIS VISIT:  New Prescriptions   CEPHALEXIN (KEFLEX) 500 MG CAPSULE    Take 1 capsule (500 mg total) by mouth 4 (four) times daily.   TRAMADOL (ULTRAM) 50 MG TABLET    Take 1 tablet (50 mg total) by mouth every 6 (six) hours as needed for moderate pain.     Note:  This document was prepared using  Dragon voice recognition software and may include unintentional dictation errors.    Joni Reining, PA-C 11/10/16 1340    Minna Antis, MD 11/10/16 [phone]

## 2016-11-10 NOTE — ED Notes (Signed)
See triage note   States she developed a small red raised area to mid abd couple of days ago  Having increased pain to area

## 2017-01-10 ENCOUNTER — Emergency Department
Admission: EM | Admit: 2017-01-10 | Discharge: 2017-01-10 | Disposition: A | Discharge status: Home / Self Care | Payer: Self-pay | Attending: Emergency Medicine | Admitting: Emergency Medicine

## 2017-01-10 ENCOUNTER — Emergency Department: Payer: Self-pay

## 2017-01-10 DIAGNOSIS — B9789 Other viral agents as the cause of diseases classified elsewhere: Secondary | ICD-10-CM | POA: Insufficient documentation

## 2017-01-10 DIAGNOSIS — J069 Acute upper respiratory infection, unspecified: Secondary | ICD-10-CM | POA: Insufficient documentation

## 2017-01-10 MED ORDER — PSEUDOEPH-BROMPHEN-DM 30-2-10 MG/5ML PO SYRP: 5.0000 mL | ORAL_SOLUTION | Freq: Four times a day (QID) | ORAL | 0 refills | Status: DC | PRN

## 2017-01-10 MED ORDER — NAPROXEN 500 MG PO TABS: 500.0000 mg | ORAL_TABLET | Freq: Two times a day (BID) | ORAL | Status: DC

## 2017-01-10 NOTE — ED Provider Notes (Signed)
[facility] Emergency Department Provider Note  ____________________________________________   First MD Initiated Contact with Patient 01/10/17 1314     (approximate)  I have reviewed the triage vital signs and the nursing notes.   HISTORY  Chief Complaint Cough    HPI Tammy Frost is a 36 y.o. female patient complain a nonproductive cough and body aches 4 days. Private denies fever or chills associated complaint. Patient denies nausea vomiting diarrhea. Patient stated this chest tightness with this complaint. No palliative measures taken for this complaint. Patient rates the pain as a 7/10. Patient described pain as "achy".   Past Medical History:  Diagnosis Date  . Anxiety   . Gallstones   . UTI (urinary tract infection) 07-20-15    Patient Active Problem List   Diagnosis Date Noted  . Chronic cholecystitis with calculus   . Cholelithiasis 07/22/2015    Past Surgical History:  Procedure Laterality Date  . CESAREAN SECTION  12/27/2011  . CESAREAN SECTION  12/31/2013  . CHOLECYSTECTOMY N/A 09/02/2015   Procedure: LAPAROSCOPIC CHOLECYSTECTOMY WITH INTRAOPERATIVE CHOLANGIOGRAM;  Surgeon: Gladis Riffleatherine L Loflin, MD;  Location: [facility] ORS;  Service: General;  Laterality: N/A;    Prior to Admission medications   Medication Sig Start Date End Date Taking? Authorizing Provider  azithromycin (ZITHROMAX Z-PAK) 250 MG tablet Take 2 tablets (500 mg) on  Day 1,  followed by 1 tablet (250 mg) once daily on Days 2 through 5. 12/24/15   Bridget HartshornSummers, Rhonda L, PA-C  brompheniramine-pseudoephedrine-DM 30-2-10 MG/5ML syrup Take 5 mLs by mouth 4 (four) times daily as needed. 01/10/17   Joni ReiningSmith, Mykiah Schmuck K, PA-C  naproxen (NAPROSYN) 500 MG tablet Take 1 tablet (500 mg total) by mouth 2 (two) times daily with a meal. 01/10/17   Joni ReiningSmith, Larone Kliethermes K, PA-C  traMADol (ULTRAM) 50 MG tablet Take 1 tablet (50 mg total) by mouth every 6 (six) hours as needed for moderate pain. 11/10/16    Joni ReiningSmith, Gregory Dowe K, PA-C    Allergies Flagyl [metronidazole] and Sulfa antibiotics  Family History  Problem Relation Age of Onset  . Cancer Maternal Grandmother        lung  . Emphysema Maternal Grandmother   . Cancer Maternal Grandfather        pancreatic  . Hypertension Mother   . Alcohol abuse Neg Hx   . Arthritis Neg Hx   . Asthma Neg Hx   . Birth defects Neg Hx   . COPD Neg Hx   . Depression Neg Hx   . Diabetes Neg Hx   . Drug abuse Neg Hx   . Early death Neg Hx   . Hearing loss Neg Hx   . Heart disease Neg Hx   . Hyperlipidemia Neg Hx   . Kidney disease Neg Hx   . Learning disabilities Neg Hx   . Mental illness Neg Hx   . Mental retardation Neg Hx   . Miscarriages / Stillbirths Neg Hx   . Vision loss Neg Hx   . Stroke Neg Hx   . Varicose Veins Neg Hx     Social History Social History  Substance Use Topics  . Smoking status: Never Smoker  . Smokeless tobacco: Never Used  . Alcohol use No    Review of Systems Constitutional: No fever/chills. Body ache Eyes: No visual changes. ENT: No sore throat. Cardiovascular: Denies chest pain. Respiratory: Denies shortness of breath. Nonproductive cough Gastrointestinal: No abdominal pain.  No nausea, no vomiting.  No diarrhea.  No constipation. Genitourinary: Negative for dysuria. Musculoskeletal: Negative for back pain. Skin: Negative for rash. Neurological: Negative for headaches, focal weakness or numbness. Allergic/Immunilogical: See medication list ____________________________________________   PHYSICAL EXAM:  VITAL SIGNS: ED Triage Vitals [01/10/17 1246]  Enc Vitals Group     BP 135/80     Pulse Rate 92     Resp 18     Temp 98.2 F (36.8 C)     Temp Source Oral     SpO2 98 %     Weight 193 lb (87.5 kg)     Height 5\' 2"  (1.575 m)     Head Circumference      Peak Flow      Pain Score 7     Pain Loc      Pain Edu?      Excl. in GC?     Constitutional: Alert and oriented. Well appearing and in no  acute distress. Nose: No congestion/rhinnorhea. Mouth/Throat: Mucous membranes are moist.  Oropharynx non-erythematous. Neck: No stridor.  Cardiovascular: Normal rate, regular rhythm. Grossly normal heart sounds.  Good peripheral circulation. Respiratory: Normal respiratory effort.  No retractions. Lungs CTAB. Gastrointestinal: Soft and nontender. No distention. No abdominal bruits. No CVA tenderness. Musculoskeletal: No lower extremity tenderness nor edema.  No joint effusions. Neurologic:  Normal speech and language. No gross focal neurologic deficits are appreciated. No gait instability. Skin:  Skin is warm, dry and intact. No rash noted. Psychiatric: Mood and affect are normal. Speech and behavior are normal.  ____________________________________________   LABS (all labs ordered are listed, but only abnormal results are displayed)  Labs Reviewed - No data to display ____________________________________________  EKG   __EKG read by heart station doctor with no acute findings __________________________________________  RADIOLOGY  Dg Chest 2 View  Result Date: 01/10/2017 CLINICAL DATA:  Shortness of breath and chest tightness for 4 days. EXAM: CHEST  2 VIEW COMPARISON:  PA lateral chest 12/24/2015. FINDINGS: Lungs clear. Heart size normal. No pneumothorax or pleural fluid. No bony abnormality. IMPRESSION: Negative chest. Electronically Signed   By: Drusilla Kanner M.D.   On: 01/10/2017 13:35    __No acute findings on chest x-ray __________________________________________   PROCEDURES  Procedure(s) performed: None  Procedures  Critical Care performed: No  ____________________________________________   INITIAL IMPRESSION / ASSESSMENT AND PLAN / ED COURSE  As part of my medical decision making, I reviewed the following data within the electronic MEDICAL RECORD NUMBER    Chest wall pain secondary to viral respiratory infection with cough. Discussed negative x-ray  findings EKG results with patient. Patient given discharge Instructions. [facility]      ____________________________________________   FINAL CLINICAL IMPRESSION(S) / ED DIAGNOSES  Final diagnoses:  Viral URI with cough      NEW MEDICATIONS STARTED DURING THIS VISIT:  Discharge Medication List as of 01/10/2017  1:44 PM    START taking these medications   Details  brompheniramine-pseudoephedrine-DM 30-2-10 MG/5ML syrup Take 5 mLs by mouth 4 (four) times daily as needed., Starting Mon 01/10/2017, Print    naproxen (NAPROSYN) 500 MG tablet Take 1 tablet (500 mg total) by mouth 2 (two) times daily with a meal., Starting Mon 01/10/2017, Print         Note:  This document was prepared using Dragon voice recognition software and may include unintentional dictation errors.    Joni Reining, PA-C 01/10/17 1354    Governor Rooks, MD 01/10/17 [phone]

## 2017-01-10 NOTE — ED Triage Notes (Signed)
Pt states a dry cough and body aches for 4 days, states chest tightness since she has had the cough, awake and alert in no acute distress

## 2017-02-28 ENCOUNTER — Other Ambulatory Visit: Payer: Self-pay

## 2017-02-28 ENCOUNTER — Emergency Department
Admission: EM | Admit: 2017-02-28 | Discharge: 2017-02-28 | Disposition: A | Discharge status: Home / Self Care | Payer: Self-pay | Attending: Emergency Medicine | Admitting: Emergency Medicine

## 2017-02-28 ENCOUNTER — Encounter: Payer: Self-pay | Admitting: Emergency Medicine

## 2017-02-28 DIAGNOSIS — Z9049 Acquired absence of other specified parts of digestive tract: Secondary | ICD-10-CM | POA: Insufficient documentation

## 2017-02-28 DIAGNOSIS — K029 Dental caries, unspecified: Secondary | ICD-10-CM | POA: Insufficient documentation

## 2017-02-28 DIAGNOSIS — F419 Anxiety disorder, unspecified: Secondary | ICD-10-CM | POA: Insufficient documentation

## 2017-02-28 DIAGNOSIS — Z79899 Other long term (current) drug therapy: Secondary | ICD-10-CM | POA: Insufficient documentation

## 2017-02-28 MED ORDER — TRAMADOL HCL 50 MG PO TABS: 50.0000 mg | ORAL_TABLET | Freq: Four times a day (QID) | ORAL | 0 refills | Status: DC | PRN

## 2017-02-28 MED ORDER — IBUPROFEN 800 MG PO TABS: 800.0000 mg | ORAL_TABLET | Freq: Three times a day (TID) | ORAL | 0 refills | Status: DC | PRN

## 2017-02-28 MED ORDER — AMOXICILLIN 500 MG PO CAPS: 500.0000 mg | ORAL_CAPSULE | Freq: Three times a day (TID) | ORAL | 0 refills | Status: DC

## 2017-02-28 NOTE — ED Triage Notes (Signed)
Upper L dental pain x 2 days.

## 2017-02-28 NOTE — ED Provider Notes (Signed)
[facility] Emergency Department Provider Note   ____________________________________________   First MD Initiated Contact with Patient 02/28/17 1407     (approximate)  I have reviewed the triage vital signs and the nursing notes.   HISTORY  Chief Complaint Dental Pain    HPI Tammy Frost is a 36 y.o. female patient presented with dental pain secondary to multiple caries. Patient was scheduled to see a dentist last week but was counseled secondary to bad weather. Patient is rescheduled see the dentist on 03/30/2017. Patient rates the pain as 8/10. Patient describes pain as "achy". No palliative measures for complaint.   Past Medical History:  Diagnosis Date  . Anxiety   . Gallstones   . UTI (urinary tract infection) 07-20-15    Patient Active Problem List   Diagnosis Date Noted  . Chronic cholecystitis with calculus   . Cholelithiasis 07/22/2015    Past Surgical History:  Procedure Laterality Date  . CESAREAN SECTION  12/27/2011  . CESAREAN SECTION  12/31/2013  . CHOLECYSTECTOMY N/A 09/02/2015   Procedure: LAPAROSCOPIC CHOLECYSTECTOMY WITH INTRAOPERATIVE CHOLANGIOGRAM;  Surgeon: Gladis Riffleatherine L Loflin, MD;  Location: [facility] ORS;  Service: General;  Laterality: N/A;    Prior to Admission medications   Medication Sig Start Date End Date Taking? Authorizing Provider  amoxicillin (AMOXIL) 500 MG capsule Take 1 capsule (500 mg total) by mouth 3 (three) times daily. 02/28/17   Joni ReiningSmith, Ronald K, PA-C  azithromycin (ZITHROMAX Z-PAK) 250 MG tablet Take 2 tablets (500 mg) on  Day 1,  followed by 1 tablet (250 mg) once daily on Days 2 through 5. 12/24/15   Tommi RumpsSummers, Rhonda L, PA-C  brompheniramine-pseudoephedrine-DM 30-2-10 MG/5ML syrup Take 5 mLs by mouth 4 (four) times daily as needed. 01/10/17   Joni ReiningSmith, Ronald K, PA-C  ibuprofen (ADVIL,MOTRIN) 800 MG tablet Take 1 tablet (800 mg total) by mouth every 8 (eight) hours as needed. 02/28/17   Joni ReiningSmith, Ronald K, PA-C    naproxen (NAPROSYN) 500 MG tablet Take 1 tablet (500 mg total) by mouth 2 (two) times daily with a meal. 01/10/17   Joni ReiningSmith, Ronald K, PA-C  traMADol (ULTRAM) 50 MG tablet Take 1 tablet (50 mg total) by mouth every 6 (six) hours as needed for moderate pain. 11/10/16   Joni ReiningSmith, Ronald K, PA-C  traMADol (ULTRAM) 50 MG tablet Take 1 tablet (50 mg total) by mouth every 6 (six) hours as needed. 02/28/17 02/28/18  Joni ReiningSmith, Ronald K, PA-C    Allergies Flagyl [metronidazole] and Sulfa antibiotics  Family History  Problem Relation Age of Onset  . Cancer Maternal Grandmother        lung  . Emphysema Maternal Grandmother   . Cancer Maternal Grandfather        pancreatic  . Hypertension Mother   . Alcohol abuse Neg Hx   . Arthritis Neg Hx   . Asthma Neg Hx   . Birth defects Neg Hx   . COPD Neg Hx   . Depression Neg Hx   . Diabetes Neg Hx   . Drug abuse Neg Hx   . Early death Neg Hx   . Hearing loss Neg Hx   . Heart disease Neg Hx   . Hyperlipidemia Neg Hx   . Kidney disease Neg Hx   . Learning disabilities Neg Hx   . Mental illness Neg Hx   . Mental retardation Neg Hx   . Miscarriages / Stillbirths Neg Hx   . Vision loss Neg Hx   .  Stroke Neg Hx   . Varicose Veins Neg Hx     Social History Social History   Tobacco Use  . Smoking status: Never Smoker  . Smokeless tobacco: Never Used  Substance Use Topics  . Alcohol use: No  . Drug use: No    Review of Systems  Constitutional: No fever/chills Eyes: No visual changes. ENT: Dental pain Cardiovascular: Denies chest pain. Respiratory: Denies shortness of breath. Gastrointestinal: No abdominal pain.  No nausea, no vomiting.  No diarrhea.  No constipation. Genitourinary: Negative for dysuria. Musculoskeletal: Negative for back pain. Skin: Negative for rash. Neurological: Negative for headaches, focal weakness or numbness. Psychiatric:Anxiety Allergic/Immunilogical: Sulfa and Flagyl  ____________________________________________   PHYSICAL EXAM:  VITAL SIGNS: ED Triage Vitals  Enc Vitals Group     BP 02/28/17 1249 (!) 148/75     Pulse Rate 02/28/17 1249 78     Resp 02/28/17 1249 20     Temp 02/28/17 1249 98.2 F (36.8 C)     Temp Source 02/28/17 1249 Oral     SpO2 02/28/17 1249 100 %     Weight 02/28/17 1250 188 lb (85.3 kg)     Height 02/28/17 1250 5\' 2"  (1.575 m)     Head Circumference --      Peak Flow --      Pain Score 02/28/17 1249 8     Pain Loc --      Pain Edu? --      Excl. in GC? --    Constitutional: Alert and oriented. Well appearing and in no acute distress. Mouth/Throat: Mucous membranes are moist.  Oropharynx non-erythematous. Multiples caries upper and lower left molar area. Hematological/Lymphatic/Immunilogical: No cervical lymphadenopathy. Cardiovascular: Normal rate, regular rhythm. Grossly normal heart sounds.  Good peripheral circulation. Respiratory: Normal respiratory effort.  No retractions. Lungs CTAB. ____________________________________________   LABS (all labs ordered are listed, but only abnormal results are displayed)  Labs Reviewed - No data to display ____________________________________________  EKG   ____________________________________________  RADIOLOGY  No results found.  ____________________________________________   PROCEDURES  Procedure(s) performed: None  Procedures  Critical Care performed: No  ____________________________________________   INITIAL IMPRESSION / ASSESSMENT AND PLAN / ED COURSE  As part of my medical decision making, I reviewed the following data within the electronic MEDICAL RECORD NUMBER Notes from prior ED visits and [place] Controlled Substance Database   Dental pain secondary to multiple caries and fractures. Is given discharge care showed advised follow scheduled an appointment. Take medication as directed.      ____________________________________________   FINAL  CLINICAL IMPRESSION(S) / ED DIAGNOSES  Final diagnoses:  Pain due to dental caries     ED Discharge Orders        Ordered    amoxicillin (AMOXIL) 500 MG capsule  3 times daily     02/28/17 1414    traMADol (ULTRAM) 50 MG tablet  Every 6 hours PRN     02/28/17 1414    ibuprofen (ADVIL,MOTRIN) 800 MG tablet  Every 8 hours PRN     02/28/17 1414       Note:  This document was prepared using Dragon voice recognition software and may include unintentional dictation errors.    Joni ReiningSmith, Ronald K, PA-C 02/28/17 1419    Jene EveryKinner, Robert, MD 02/28/17 1425

## 2017-02-28 NOTE — Discharge Instructions (Signed)
Follow-up with scheduled an appointment on 09/27/2017.

## 2017-08-30 ENCOUNTER — Encounter: Payer: Self-pay | Admitting: Emergency Medicine

## 2017-08-30 DIAGNOSIS — R109 Unspecified abdominal pain: Secondary | ICD-10-CM | POA: Insufficient documentation

## 2017-08-30 DIAGNOSIS — Z5321 Procedure and treatment not carried out due to patient leaving prior to being seen by health care provider: Secondary | ICD-10-CM | POA: Insufficient documentation

## 2017-08-30 DIAGNOSIS — R112 Nausea with vomiting, unspecified: Secondary | ICD-10-CM | POA: Insufficient documentation

## 2017-08-30 LAB — COMPREHENSIVE METABOLIC PANEL
ALT: 24 U/L (ref 14–54)
AST: 21 U/L (ref 15–41)
Albumin: 4.1 g/dL (ref 3.5–5.0)
Alkaline Phosphatase: 57 U/L (ref 38–126)
Anion gap: 10 (ref 5–15)
BUN: 8 mg/dL (ref 6–20)
CHLORIDE: 105 mmol/L (ref 101–111)
CO2: 23 mmol/L (ref 22–32)
CREATININE: 0.85 mg/dL (ref 0.44–1.00)
Calcium: 9 mg/dL (ref 8.9–10.3)
GFR calc Af Amer: >60 mL/min (ref >60)
Glucose, Bld: 115 mg/dL — ABNORMAL HIGH (ref 65–99)
POTASSIUM: 3.7 mmol/L (ref 3.5–5.1)
SODIUM: 138 mmol/L (ref 135–145)
Total Bilirubin: 0.7 mg/dL (ref 0.3–1.2)
Total Protein: 7.7 g/dL (ref 6.5–8.1)

## 2017-08-30 LAB — LIPASE, BLOOD: LIPASE: 24 U/L (ref 11–51)

## 2017-08-30 LAB — CBC
HEMATOCRIT: 38.5 % (ref 35.0–47.0)
Hemoglobin: 13 g/dL (ref 12.0–16.0)
MCH: 27.3 pg (ref 26.0–34.0)
MCHC: 33.7 g/dL (ref 32.0–36.0)
MCV: 80.8 fL (ref 80.0–100.0)
Platelets: 264 10*3/uL (ref 150–440)
RBC: 4.76 MIL/uL (ref 3.80–5.20)
RDW: 13.2 % (ref 11.5–14.5)
WBC: 7.8 10*3/uL (ref 3.6–11.0)

## 2017-08-30 LAB — URINALYSIS, COMPLETE (UACMP) WITH MICROSCOPIC
Bilirubin Urine: NEGATIVE
Glucose, UA: NEGATIVE mg/dL
Hgb urine dipstick: NEGATIVE
KETONES UR: NEGATIVE mg/dL
LEUKOCYTES UA: NEGATIVE
Nitrite: NEGATIVE
Protein, ur: NEGATIVE mg/dL
Specific Gravity, Urine: 1.011 (ref 1.005–1.030)
pH: 6 (ref 5.0–8.0)

## 2017-08-30 NOTE — ED Triage Notes (Signed)
Pt c/o right sided abdominal pain with n/v x2 days. Pt denies fever and urinary symptoms.

## 2017-08-31 ENCOUNTER — Emergency Department
Admission: EM | Admit: 2017-08-31 | Discharge: 2017-08-31 | Disposition: A | Discharge status: Home / Self Care | Payer: Self-pay | Attending: Emergency Medicine | Admitting: Emergency Medicine

## 2017-09-04 ENCOUNTER — Encounter
Admission: EM | Disposition: A | Discharge status: Home / Self Care | Payer: Self-pay | Source: Home / Self Care | Attending: Emergency Medicine

## 2017-09-04 ENCOUNTER — Encounter: Payer: Self-pay | Admitting: Emergency Medicine

## 2017-09-04 ENCOUNTER — Other Ambulatory Visit: Payer: Self-pay

## 2017-09-04 ENCOUNTER — Emergency Department: Payer: Self-pay | Admitting: Anesthesiology

## 2017-09-04 ENCOUNTER — Emergency Department: Payer: Self-pay

## 2017-09-04 ENCOUNTER — Observation Stay
Admission: EM | Admit: 2017-09-04 | Discharge: 2017-09-05 | Disposition: A | Discharge status: Home / Self Care | Payer: Self-pay | Attending: Surgery | Admitting: Surgery

## 2017-09-04 DIAGNOSIS — Z791 Long term (current) use of non-steroidal anti-inflammatories (NSAID): Secondary | ICD-10-CM | POA: Insufficient documentation

## 2017-09-04 DIAGNOSIS — K6389 Other specified diseases of intestine: Secondary | ICD-10-CM | POA: Insufficient documentation

## 2017-09-04 DIAGNOSIS — K37 Unspecified appendicitis: Secondary | ICD-10-CM | POA: Diagnosis present

## 2017-09-04 DIAGNOSIS — K3580 Unspecified acute appendicitis: Principal | ICD-10-CM

## 2017-09-04 HISTORY — PX: LAPAROSCOPIC APPENDECTOMY: SHX408

## 2017-09-04 LAB — COMPREHENSIVE METABOLIC PANEL
ALK PHOS: 50 U/L (ref 38–126)
ALT: 21 U/L (ref 14–54)
ANION GAP: 9 (ref 5–15)
AST: 22 U/L (ref 15–41)
Albumin: 3.9 g/dL (ref 3.5–5.0)
BUN: 8 mg/dL (ref 6–20)
CALCIUM: 8.8 mg/dL — AB (ref 8.9–10.3)
CO2: 21 mmol/L — AB (ref 22–32)
CREATININE: 0.87 mg/dL (ref 0.44–1.00)
Chloride: 106 mmol/L (ref 101–111)
GFR calc Af Amer: >60 mL/min (ref >60)
Glucose, Bld: 159 mg/dL — ABNORMAL HIGH (ref 65–99)
Potassium: 3.4 mmol/L — ABNORMAL LOW (ref 3.5–5.1)
SODIUM: 136 mmol/L (ref 135–145)
Total Bilirubin: 0.5 mg/dL (ref 0.3–1.2)
Total Protein: 7.7 g/dL (ref 6.5–8.1)

## 2017-09-04 LAB — URINALYSIS, COMPLETE (UACMP) WITH MICROSCOPIC
BILIRUBIN URINE: NEGATIVE
Bacteria, UA: NONE SEEN
GLUCOSE, UA: NEGATIVE mg/dL
HGB URINE DIPSTICK: NEGATIVE
KETONES UR: NEGATIVE mg/dL
LEUKOCYTES UA: NEGATIVE
NITRITE: NEGATIVE
PH: 6 (ref 5.0–8.0)
PROTEIN: NEGATIVE mg/dL
Specific Gravity, Urine: 1.017 (ref 1.005–1.030)

## 2017-09-04 LAB — CBC
HCT: 39.2 % (ref 35.0–47.0)
HEMOGLOBIN: 13.2 g/dL (ref 12.0–16.0)
MCH: 27.6 pg (ref 26.0–34.0)
MCHC: 33.8 g/dL (ref 32.0–36.0)
MCV: 81.8 fL (ref 80.0–100.0)
PLATELETS: 235 10*3/uL (ref 150–440)
RBC: 4.79 MIL/uL (ref 3.80–5.20)
RDW: 13.3 % (ref 11.5–14.5)
WBC: 7.2 10*3/uL (ref 3.6–11.0)

## 2017-09-04 LAB — LIPASE, BLOOD: LIPASE: 25 U/L (ref 11–51)

## 2017-09-04 LAB — POCT PREGNANCY, URINE: Preg Test, Ur: NEGATIVE

## 2017-09-04 SURGERY — APPENDECTOMY, LAPAROSCOPIC
Anesthesia: General

## 2017-09-04 MED ORDER — PIPERACILLIN-TAZOBACTAM 3.375 G IVPB
3.3750 g | Freq: Once | INTRAVENOUS | Status: AC
  Administered 2017-09-04: 3.375 g via INTRAVENOUS

## 2017-09-04 MED ORDER — SUCCINYLCHOLINE CHLORIDE 20 MG/ML IJ SOLN
INTRAMUSCULAR | Status: DC | PRN
  Administered 2017-09-04: 100 mg via INTRAVENOUS

## 2017-09-04 MED ORDER — ACETAMINOPHEN 10 MG/ML IV SOLN
INTRAVENOUS | Status: DC | PRN
  Administered 2017-09-04: 1000 mg via INTRAVENOUS

## 2017-09-04 MED ORDER — PROMETHAZINE HCL 25 MG/ML IJ SOLN: 6.2500 mg | INTRAMUSCULAR | Status: DC | PRN

## 2017-09-04 MED ORDER — ONDANSETRON HCL 4 MG/2ML IJ SOLN
INTRAMUSCULAR | Status: AC
  Filled 2017-09-04: qty 2

## 2017-09-04 MED ORDER — SUGAMMADEX SODIUM 200 MG/2ML IV SOLN
INTRAVENOUS | Status: DC | PRN
  Administered 2017-09-04: 190 mg via INTRAVENOUS

## 2017-09-04 MED ORDER — PROPOFOL 10 MG/ML IV BOLUS
INTRAVENOUS | Status: AC
  Filled 2017-09-04: qty 20

## 2017-09-04 MED ORDER — PANTOPRAZOLE SODIUM 40 MG PO TBEC
40.0000 mg | DELAYED_RELEASE_TABLET | Freq: Every day | ORAL | Status: DC
  Administered 2017-09-04: 40 mg via ORAL
  Filled 2017-09-04 (×2): qty 1

## 2017-09-04 MED ORDER — ACETAMINOPHEN 500 MG PO TABS
1000.0000 mg | ORAL_TABLET | Freq: Four times a day (QID) | ORAL | Status: DC
  Administered 2017-09-05 (×2): 1000 mg via ORAL
  Filled 2017-09-04 (×2): qty 2

## 2017-09-04 MED ORDER — ONDANSETRON HCL 4 MG/2ML IJ SOLN: 4.0000 mg | Freq: Four times a day (QID) | INTRAMUSCULAR | Status: DC | PRN

## 2017-09-04 MED ORDER — FENTANYL CITRATE (PF) 100 MCG/2ML IJ SOLN
INTRAMUSCULAR | Status: AC
  Filled 2017-09-04: qty 2

## 2017-09-04 MED ORDER — SODIUM CHLORIDE 0.9 % IV SOLN
INTRAVENOUS | Status: DC | PRN
  Administered 2017-09-04: 17:00:00 via INTRAVENOUS

## 2017-09-04 MED ORDER — DEXAMETHASONE SODIUM PHOSPHATE 10 MG/ML IJ SOLN
INTRAMUSCULAR | Status: DC | PRN
  Administered 2017-09-04: 10 mg via INTRAVENOUS

## 2017-09-04 MED ORDER — KETOROLAC TROMETHAMINE 30 MG/ML IJ SOLN
INTRAMUSCULAR | Status: AC
  Filled 2017-09-04: qty 1

## 2017-09-04 MED ORDER — MORPHINE SULFATE (PF) 2 MG/ML IV SOLN: 2.0000 mg | INTRAVENOUS | Status: DC | PRN

## 2017-09-04 MED ORDER — SUGAMMADEX SODIUM 200 MG/2ML IV SOLN
INTRAVENOUS | Status: AC
  Filled 2017-09-04: qty 2

## 2017-09-04 MED ORDER — BUPIVACAINE-EPINEPHRINE (PF) 0.25% -1:200000 IJ SOLN
INTRAMUSCULAR | Status: AC
  Filled 2017-09-04: qty 30

## 2017-09-04 MED ORDER — PIPERACILLIN-TAZOBACTAM 3.375 G IVPB
3.3750 g | Freq: Once | INTRAVENOUS | Status: AC
  Administered 2017-09-04: 3.375 g via INTRAVENOUS
  Filled 2017-09-04: qty 50

## 2017-09-04 MED ORDER — ENOXAPARIN SODIUM 40 MG/0.4ML ~~LOC~~ SOLN
40.0000 mg | SUBCUTANEOUS | Status: DC
  Administered 2017-09-05: 40 mg via SUBCUTANEOUS
  Filled 2017-09-04: qty 0.4

## 2017-09-04 MED ORDER — ONDANSETRON HCL 4 MG/2ML IJ SOLN
4.0000 mg | Freq: Once | INTRAMUSCULAR | Status: AC
  Administered 2017-09-04: 4 mg via INTRAVENOUS

## 2017-09-04 MED ORDER — LACTATED RINGERS IV SOLN
INTRAVENOUS | Status: DC | PRN
  Administered 2017-09-04: 18:00:00 via INTRAVENOUS

## 2017-09-04 MED ORDER — KETOROLAC TROMETHAMINE 30 MG/ML IJ SOLN
INTRAMUSCULAR | Status: DC | PRN
  Administered 2017-09-04: 30 mg via INTRAVENOUS

## 2017-09-04 MED ORDER — MIDAZOLAM HCL 2 MG/2ML IJ SOLN
INTRAMUSCULAR | Status: AC
  Filled 2017-09-04: qty 2

## 2017-09-04 MED ORDER — LIDOCAINE HCL (CARDIAC) PF 100 MG/5ML IV SOSY
PREFILLED_SYRINGE | INTRAVENOUS | Status: DC | PRN
  Administered 2017-09-04: 100 mg via INTRAVENOUS

## 2017-09-04 MED ORDER — FENTANYL CITRATE (PF) 100 MCG/2ML IJ SOLN
INTRAMUSCULAR | Status: DC | PRN
  Administered 2017-09-04 (×2): 50 ug via INTRAVENOUS

## 2017-09-04 MED ORDER — OXYCODONE HCL 5 MG PO TABS
5.0000 mg | ORAL_TABLET | ORAL | Status: DC | PRN
  Administered 2017-09-04: 10 mg via ORAL
  Filled 2017-09-04: qty 2

## 2017-09-04 MED ORDER — PIPERACILLIN-TAZOBACTAM 3.375 G IVPB
INTRAVENOUS | Status: AC
  Administered 2017-09-04: 3.375 g via INTRAVENOUS
  Filled 2017-09-04: qty 50

## 2017-09-04 MED ORDER — LACTATED RINGERS IV SOLN
INTRAVENOUS | Status: DC
  Administered 2017-09-04 – 2017-09-05 (×2): via INTRAVENOUS

## 2017-09-04 MED ORDER — ACETAMINOPHEN 10 MG/ML IV SOLN
INTRAVENOUS | Status: AC
  Filled 2017-09-04: qty 100

## 2017-09-04 MED ORDER — ONDANSETRON HCL 4 MG/2ML IJ SOLN
INTRAMUSCULAR | Status: DC | PRN
  Administered 2017-09-04: 4 mg via INTRAVENOUS

## 2017-09-04 MED ORDER — MIDAZOLAM HCL 2 MG/2ML IJ SOLN
INTRAMUSCULAR | Status: DC | PRN
  Administered 2017-09-04: 2 mg via INTRAVENOUS

## 2017-09-04 MED ORDER — ONDANSETRON 4 MG PO TBDP: 4.0000 mg | ORAL_TABLET | Freq: Four times a day (QID) | ORAL | Status: DC | PRN

## 2017-09-04 MED ORDER — PROPOFOL 10 MG/ML IV BOLUS
INTRAVENOUS | Status: DC | PRN
  Administered 2017-09-04: 200 mg via INTRAVENOUS

## 2017-09-04 MED ORDER — BUPIVACAINE-EPINEPHRINE 0.25% -1:200000 IJ SOLN
INTRAMUSCULAR | Status: DC | PRN
  Administered 2017-09-04: 30 mL

## 2017-09-04 MED ORDER — KETOROLAC TROMETHAMINE 30 MG/ML IJ SOLN
30.0000 mg | Freq: Four times a day (QID) | INTRAMUSCULAR | Status: DC
  Administered 2017-09-05 (×2): 30 mg via INTRAVENOUS
  Filled 2017-09-04 (×2): qty 1

## 2017-09-04 MED ORDER — ROCURONIUM BROMIDE 100 MG/10ML IV SOLN
INTRAVENOUS | Status: DC | PRN
  Administered 2017-09-04: 30 mg via INTRAVENOUS
  Administered 2017-09-04: 10 mg via INTRAVENOUS

## 2017-09-04 MED ORDER — FENTANYL CITRATE (PF) 100 MCG/2ML IJ SOLN: 25.0000 ug | INTRAMUSCULAR | Status: DC | PRN

## 2017-09-04 MED ORDER — SODIUM CHLORIDE 0.9 % IV BOLUS
1000.0000 mL | Freq: Once | INTRAVENOUS | Status: AC
  Administered 2017-09-04: 1000 mL via INTRAVENOUS

## 2017-09-04 SURGICAL SUPPLY — 39 items
ADH SKN CLS APL DERMABOND .7 (GAUZE/BANDAGES/DRESSINGS) ×1
APPLIER CLIP 5 13 M/L LIGAMAX5 (MISCELLANEOUS)
APR CLP MED LRG 5 ANG JAW (MISCELLANEOUS)
BAG SPEC RTRVL LRG 6X4 10 (ENDOMECHANICALS) ×1
BLADE CLIPPER SURG (BLADE) ×3 IMPLANT
CANISTER SUCT 1200ML W/VALVE (MISCELLANEOUS) ×3 IMPLANT
CHLORAPREP W/TINT 26ML (MISCELLANEOUS) ×3 IMPLANT
CLIP APPLIE 5 13 M/L LIGAMAX5 (MISCELLANEOUS) IMPLANT
CUTTER FLEX LINEAR 45M (STAPLE) ×3 IMPLANT
DERMABOND ADVANCED (GAUZE/BANDAGES/DRESSINGS) ×2
DERMABOND ADVANCED .7 DNX12 (GAUZE/BANDAGES/DRESSINGS) ×1 IMPLANT
ELECT CAUTERY BLADE 6.4 (BLADE) ×3 IMPLANT
ELECT REM PT RETURN 9FT ADLT (ELECTROSURGICAL) ×3
ELECTRODE REM PT RTRN 9FT ADLT (ELECTROSURGICAL) ×1 IMPLANT
GLOVE BIO SURGEON STRL SZ7 (GLOVE) ×9 IMPLANT
GOWN STRL REUS W/ TWL LRG LVL3 (GOWN DISPOSABLE) ×2 IMPLANT
GOWN STRL REUS W/TWL LRG LVL3 (GOWN DISPOSABLE) ×6
IRRIGATION STRYKERFLOW (MISCELLANEOUS) ×1 IMPLANT
IRRIGATOR STRYKERFLOW (MISCELLANEOUS) ×3
IV NS 1000ML (IV SOLUTION) ×3
IV NS 1000ML BAXH (IV SOLUTION) ×1 IMPLANT
NEEDLE HYPO 22GX1.5 SAFETY (NEEDLE) ×3 IMPLANT
NS IRRIG 500ML POUR BTL (IV SOLUTION) ×3 IMPLANT
PACK LAP CHOLECYSTECTOMY (MISCELLANEOUS) ×3 IMPLANT
PENCIL ELECTRO HAND CTR (MISCELLANEOUS) ×3 IMPLANT
POUCH SPECIMEN RETRIEVAL 10MM (ENDOMECHANICALS) ×3 IMPLANT
RELOAD 45 VASCULAR/THIN (ENDOMECHANICALS) ×3 IMPLANT
RELOAD STAPLE TA45 3.5 REG BLU (ENDOMECHANICALS) ×3 IMPLANT
SCISSORS METZENBAUM CVD 33 (INSTRUMENTS) ×3 IMPLANT
SHEARS HARMONIC ACE PLUS 36CM (ENDOMECHANICALS) ×3 IMPLANT
SLEEVE ENDOPATH XCEL 5M (ENDOMECHANICALS) ×3 IMPLANT
SPONGE LAP 18X18 RF (DISPOSABLE) ×3 IMPLANT
SUT MNCRL AB 4-0 PS2 18 (SUTURE) ×3 IMPLANT
SUT VICRYL 0 AB UR-6 (SUTURE) ×6 IMPLANT
SYR 20CC LL (SYRINGE) ×3 IMPLANT
TRAY FOLEY MTR SLVR 16FR STAT (SET/KITS/TRAYS/PACK) IMPLANT
TROCAR XCEL BLUNT TIP 100MML (ENDOMECHANICALS) ×3 IMPLANT
TROCAR XCEL NON-BLD 5MMX100MML (ENDOMECHANICALS) ×6 IMPLANT
TUBING INSUF HEATED (TUBING) ×3 IMPLANT

## 2017-09-04 NOTE — ED Provider Notes (Signed)
[facility] Emergency Department Provider Note  Time seen: 2:33 PM  I have reviewed the triage vital signs and the nursing notes.   HISTORY  Chief Complaint Abdominal Pain    HPI Tammy Frost is a 37 y.o. female with a past medical history of anxiety, gallstones status post cholecystectomy 3 years ago, presents to the emergency department for right flank pain.  According to the patient for the past 1 week she has been expensing intermittent pain in the right flank, has become more constant over the past 2 to 3 days.  Denies any known fever, nausea, vomiting does state occasional loose stool.  Denies vaginal bleeding or discharge.  Patient does state a history of ovarian cyst also continues to have her appendix.   Past Medical History:  Diagnosis Date  . Anxiety   . Gallstones   . UTI (urinary tract infection) 07-20-15    Patient Active Problem List   Diagnosis Date Noted  . Chronic cholecystitis with calculus   . Cholelithiasis 07/22/2015    Past Surgical History:  Procedure Laterality Date  . CESAREAN SECTION  12/27/2011  . CESAREAN SECTION  12/31/2013  . CHOLECYSTECTOMY N/A 09/02/2015   Procedure: LAPAROSCOPIC CHOLECYSTECTOMY WITH INTRAOPERATIVE CHOLANGIOGRAM;  Surgeon: Gladis Riffleatherine L Loflin, MD;  Location: [facility] ORS;  Service: General;  Laterality: N/A;    Prior to Admission medications   Medication Sig Start Date End Date Taking? Authorizing Provider  amoxicillin (AMOXIL) 500 MG capsule Take 1 capsule (500 mg total) by mouth 3 (three) times daily. 02/28/17   Joni ReiningSmith, Ronald K, PA-C  azithromycin (ZITHROMAX Z-PAK) 250 MG tablet Take 2 tablets (500 mg) on  Day 1,  followed by 1 tablet (250 mg) once daily on Days 2 through 5. 12/24/15   Tommi RumpsSummers, Rhonda L, PA-C  brompheniramine-pseudoephedrine-DM 30-2-10 MG/5ML syrup Take 5 mLs by mouth 4 (four) times daily as needed. 01/10/17   Joni ReiningSmith, Ronald K, PA-C  ibuprofen (ADVIL,MOTRIN) 800 MG tablet Take 1 tablet (800  mg total) by mouth every 8 (eight) hours as needed. 02/28/17   Joni ReiningSmith, Ronald K, PA-C  naproxen (NAPROSYN) 500 MG tablet Take 1 tablet (500 mg total) by mouth 2 (two) times daily with a meal. 01/10/17   Joni ReiningSmith, Ronald K, PA-C  traMADol (ULTRAM) 50 MG tablet Take 1 tablet (50 mg total) by mouth every 6 (six) hours as needed for moderate pain. 11/10/16   Joni ReiningSmith, Ronald K, PA-C  traMADol (ULTRAM) 50 MG tablet Take 1 tablet (50 mg total) by mouth every 6 (six) hours as needed. 02/28/17 02/28/18  Joni ReiningSmith, Ronald K, PA-C    Allergies  Allergen Reactions  . Flagyl [Metronidazole] Hives and Rash    Patient states her skins burn.  . Sulfa Antibiotics Hives and Rash    Patient states her skin burns.    Family History  Problem Relation Age of Onset  . Cancer Maternal Grandmother        lung  . Emphysema Maternal Grandmother   . Cancer Maternal Grandfather        pancreatic  . Hypertension Mother   . Alcohol abuse Neg Hx   . Arthritis Neg Hx   . Asthma Neg Hx   . Birth defects Neg Hx   . COPD Neg Hx   . Depression Neg Hx   . Diabetes Neg Hx   . Drug abuse Neg Hx   . Early death Neg Hx   . Hearing loss Neg Hx   . Heart disease Neg  Hx   . Hyperlipidemia Neg Hx   . Kidney disease Neg Hx   . Learning disabilities Neg Hx   . Mental illness Neg Hx   . Mental retardation Neg Hx   . Miscarriages / Stillbirths Neg Hx   . Vision loss Neg Hx   . Stroke Neg Hx   . Varicose Veins Neg Hx     Social History Social History   Tobacco Use  . Smoking status: Never Smoker  . Smokeless tobacco: Never Used  Substance Use Topics  . Alcohol use: No  . Drug use: No    Review of Systems Constitutional: Negative for fever.  99.1 in the emergency department. Cardiovascular: Negative for chest pain. Respiratory: Negative for shortness of breath. Gastrointestinal: Right lower quadrant abdominal pain.  Negative for nausea vomiting.  Positive for intermittent loose stool. Genitourinary: Negative for  urinary compaints.  Negative for dysuria, hematuria or discharge/bleeding. Skin: Negative for skin complaints  Neurological: Negative for headache All other ROS negative  ____________________________________________   PHYSICAL EXAM:  VITAL SIGNS: ED Triage Vitals  Enc Vitals Group     BP 09/04/17 1330 (!) 191/82     Pulse Rate 09/04/17 1330 (!) 101     Resp 09/04/17 1330 16     Temp 09/04/17 1330 99.1 F (37.3 C)     Temp Source 09/04/17 1330 Oral     SpO2 09/04/17 1330 99 %     Weight 09/04/17 1331 188 lb (85.3 kg)     Height 09/04/17 1331 5\' 1"  (1.549 m)     Head Circumference --      Peak Flow --      Pain Score 09/04/17 1330 5     Pain Loc --      Pain Edu? --      Excl. in GC? --     Constitutional: Alert and oriented. Well appearing and in no distress. Eyes: Normal exam ENT   Head: Normocephalic and atraumatic.   Mouth/Throat: Mucous membranes are moist. Cardiovascular: Normal rate, regular rhythm. No murmur Respiratory: Normal respiratory effort without tachypnea nor retractions. Breath sounds are clear  Gastrointestinal: Soft, obese, moderate right lower quadrant tenderness to palpation, no rebound or guarding, no distention, abdomen otherwise benign. Musculoskeletal: Nontender with normal range of motion in all extremities.  Neurologic:  Normal speech and language. No gross focal neurologic deficits Skin:  Skin is warm, dry and intact.  Psychiatric: Mood and affect are normal.   ____________________________________________     RADIOLOGY  CT concerning for acute appendicitis  ____________________________________________   INITIAL IMPRESSION / ASSESSMENT AND PLAN / ED COURSE  Pertinent labs & imaging results that were available during my care of the patient were reviewed by me and considered in my medical decision making (see chart for details).  Patient presents to the emergency department for right lower quadrant abdominal pain ongoing for the  past 1 year.  Patient states moderate pain in the right lower quadrant, moderate tenderness to palpation.  Differential would include appendicitis, ovarian cyst, urinary tract infection, pyelonephritis, ureterolithiasis, colitis or diverticulitis.  Reassuringly the patient's lab work is largely within normal limits including a normal urinalysis, normal white blood cell count.  LFTs and lipase are normal.  Given the patient's right lower quadrant tenderness over McBurney's point will obtain CT imaging to further evaluate for appendicitis, kidney stones, ovarian cyst.  Patient agreeable to plan of care.  CT concerning for acute appendicitis.  Discussed with general surgery will be down to  see the patient once out of the OR. ____________________________________________   FINAL CLINICAL IMPRESSION(S) / ED DIAGNOSES  Right lower quadrant abdominal pain Acute appendicitis   Minna Antis, MD 09/04/17 1533

## 2017-09-04 NOTE — ED Notes (Signed)
Pt taken for ct

## 2017-09-04 NOTE — Anesthesia Procedure Notes (Signed)
Procedure Name: Intubation Date/Time: 09/04/2017 5:19 PM Performed by: Aline Brochure, CRNA Pre-anesthesia Checklist: Patient identified, Emergency Drugs available, Suction available and Patient being monitored Patient Re-evaluated:Patient Re-evaluated prior to induction Oxygen Delivery Method: Circle system utilized Preoxygenation: Pre-oxygenation with 100% oxygen Induction Type: IV induction Ventilation: Mask ventilation without difficulty Laryngoscope Size: Mac and 3 Grade View: Grade I Tube type: Oral Tube size: 7.0 mm Number of attempts: 1 Airway Equipment and Method: Stylet Placement Confirmation: ETT inserted through vocal cords under direct vision,  positive ETCO2 and breath sounds checked- equal and bilateral Secured at: 22 cm Tube secured with: Tape Dental Injury: Teeth and Oropharynx as per pre-operative assessment

## 2017-09-04 NOTE — Op Note (Signed)
laparascopic appendectomy   Tammy Frost Date of operation:  09/04/2017  Indications: The patient presented with a history of  abdominal pain. Workup has revealed findings consistent with acute appendicitis.  Pre-operative Diagnosis: Acute appendicitis without mention of peritonitis  Post-operative Diagnosis: Same  Surgeon: Tammy Bigiego Eleno Weimar, MD, FACS  Anesthesia: General with endotracheal tube  Findings: non perforated appendicitis  Estimated Blood Loss: 10cc         Specimens: appendix         Complications:  none  Procedure Details  The patient was seen again in the preop area. The options of surgery versus observation were reviewed with the patient and/or family. The risks of bleeding, infection, recurrence of symptoms, negative laparoscopy, potential for an open procedure, bowel injury, abscess or infection, were all reviewed as well. The patient was taken to Operating Room, identified as Tammy Frost and the procedure verified as laparoscopic appendectomy. A Time Out was held and the above information confirmed.  The patient was placed in the supine position and general anesthesia was induced.  Antibiotic prophylaxis was administered and VT E prophylaxis was in place. A Foley catheter was placed by the nursing staff.   The abdomen was prepped and draped in a sterile fashion. An infraumbilical incision was made. A cutdown technique was used to enter the abdominal cavity. Two vicryl stitches were placed on the fascia and a Hasson trocar inserted. Pneumoperitoneum obtained. Two 5 mm ports were placed under direct visualization.   The appendix was identified and found to be acutely inflamed  The appendix was carefully dissected. The mesoappendix was divided withHarmonic scalpel. The base of the appendix was dissected out and divided with a standard load Endo GIA.The appendix was placed in a Endo Catch bag and removed via the Hasson port. The right lower quadrant and pelvis was then  irrigated with  normal saline which was aspirated. Inspection  failed to identify any additional bleeding and there were no signs of bowel injury. Again the right lower quadrant was inspected there was no sign of bleeding or bowel injury therefore pneumoperitoneum was released, all ports were removed.  The umbilical fascia was closed with 0 Vicryl interrupted sutures and the skin incisions were approximated with subcuticular 4-0 Monocryl. Dermabond was placed The patient tolerated the procedure well, there were no complications. The sponge lap and needle count were correct at the end of the procedure.  The patient was taken to the recovery room in stable condition to be admitted for continued care.    Tammy Bigiego Hildreth Orsak, MD FACS

## 2017-09-04 NOTE — ED Triage Notes (Signed)
Patient reports sharp right lower quadrant abdominal pain that began approx. 1 week ago and has been intermittent.  Patient states, "I've been ignoring It because I had my kids and I couldn't come up here."  Patient reports nausea but denies vomiting and diarrhea.

## 2017-09-04 NOTE — Anesthesia Postprocedure Evaluation (Signed)
Anesthesia Post Note  Patient: Tammy Frost  Procedure(s) Performed: APPENDECTOMY LAPAROSCOPIC (N/A )  Patient location during evaluation: PACU Anesthesia Type: General Level of consciousness: awake and alert Pain management: pain level controlled Vital Signs Assessment: post-procedure vital signs reviewed and stable Respiratory status: spontaneous breathing, nonlabored ventilation, respiratory function stable and patient connected to nasal cannula oxygen Cardiovascular status: blood pressure returned to baseline and stable Postop Assessment: no apparent nausea or vomiting Anesthetic complications: no     Last Vitals:  Vitals:   09/04/17 1839 09/04/17 1853  BP: (!) 109/59 111/64  Pulse: 98 91  Resp: 19 14  Temp: 36.8 C   SpO2: 99% 97%    Last Pain:  Vitals:   09/04/17 1853  TempSrc:   PainSc: 3                  Lenard SimmerAndrew Paullette Mckain

## 2017-09-04 NOTE — ED Notes (Signed)
Pt states that she is having pain on her right side that is tingly at times and numb at times. Pt denies injury, denies pain with urination. No rash or blisters noted to the skin

## 2017-09-04 NOTE — Transfer of Care (Signed)
Immediate Anesthesia Transfer of Care Note  Patient: Tammy Frost  Procedure(s) Performed: APPENDECTOMY LAPAROSCOPIC (N/A )  Patient Location: PACU  Anesthesia Type:General  Level of Consciousness: awake  Airway & Oxygen Therapy: Patient Spontanous Breathing  Post-op Assessment: Post -op Vital signs reviewed and stable  Post vital signs: stable  Last Vitals:  Vitals Value Taken Time  BP 124/77 09/04/2017  6:11 PM  Temp    Pulse 117 09/04/2017  6:11 PM  Resp 15 09/04/2017  6:11 PM  SpO2 99 % 09/04/2017  6:11 PM    Last Pain:  Vitals:   09/04/17 1330  TempSrc: Oral  PainSc: 5          Complications: No apparent anesthesia complications

## 2017-09-04 NOTE — H&P (Addendum)
Patient ID: Tammy Frost, female   DOB: November 01, 1980, 37 y.o.   MRN: [id number]  HPI Tammy Frost is a 37 y.o. female sent to the emergency room with a 5 to 7-day history of right lower quadrant pain.  Pain is sharp is intermittent and worsening with certain movement.  Pain is moderate intensity.  She does have associated nausea and decreased appetite.  No fevers no chills. CT scan personal review there is evidence of dilation of the appendix as compared to previous study couple years ago there is an significant increase in the size of the appendix.  There is some mild periappendiceal stranding.  No evidence of perforation or abscess.  White count and creatinine are normal. She had a previous history of C-section and laparoscopic cholecystectomy. Is able to perform more than 4 METS of activity without any shortness of breath or chest pain  HPI  Past Medical History:  Diagnosis Date  . Anxiety   . Gallstones   . UTI (urinary tract infection) 07-20-15    Past Surgical History:  Procedure Laterality Date  . CESAREAN SECTION  12/27/2011  . CESAREAN SECTION  12/31/2013  . CHOLECYSTECTOMY N/A 09/02/2015   Procedure: LAPAROSCOPIC CHOLECYSTECTOMY WITH INTRAOPERATIVE CHOLANGIOGRAM;  Surgeon: Gladis Riffle, MD;  Location: [facility] ORS;  Service: General;  Laterality: N/A;    Family History  Problem Relation Age of Onset  . Cancer Maternal Grandmother        lung  . Emphysema Maternal Grandmother   . Cancer Maternal Grandfather        pancreatic  . Hypertension Mother   . Alcohol abuse Neg Hx   . Arthritis Neg Hx   . Asthma Neg Hx   . Birth defects Neg Hx   . COPD Neg Hx   . Depression Neg Hx   . Diabetes Neg Hx   . Drug abuse Neg Hx   . Early death Neg Hx   . Hearing loss Neg Hx   . Heart disease Neg Hx   . Hyperlipidemia Neg Hx   . Kidney disease Neg Hx   . Learning disabilities Neg Hx   . Mental illness Neg Hx   . Mental retardation Neg Hx   . Miscarriages / Stillbirths Neg Hx    . Vision loss Neg Hx   . Stroke Neg Hx   . Varicose Veins Neg Hx     Social History Social History   Tobacco Use  . Smoking status: Never Smoker  . Smokeless tobacco: Never Used  Substance Use Topics  . Alcohol use: No  . Drug use: No    Allergies  Allergen Reactions  . Flagyl [Metronidazole] Hives and Rash    Patient states her skins burn.  . Sulfa Antibiotics Hives and Rash    Patient states her skin burns.    Current Facility-Administered Medications  Medication Dose Route Frequency Provider Last Rate Last Dose  . piperacillin-tazobactam (ZOSYN) IVPB 3.375 g  3.375 g Intravenous Once Minna Antis, MD      . sodium chloride 0.9 % bolus 1,000 mL  1,000 mL Intravenous Once Pabon, Merri Ray, MD       Current Outpatient Medications  Medication Sig Dispense Refill  . brompheniramine-pseudoephedrine-DM 30-2-10 MG/5ML syrup Take 5 mLs by mouth 4 (four) times daily as needed. (Patient not taking: Reported on 09/04/2017) 120 mL 0  . ibuprofen (ADVIL,MOTRIN) 800 MG tablet Take 1 tablet (800 mg total) by mouth every 8 (eight) hours as needed. (Patient not  taking: Reported on 09/04/2017) 30 tablet 0  . naproxen (NAPROSYN) 500 MG tablet Take 1 tablet (500 mg total) by mouth 2 (two) times daily with a meal. (Patient not taking: Reported on 09/04/2017) 20 tablet 00  . traMADol (ULTRAM) 50 MG tablet Take 1 tablet (50 mg total) by mouth every 6 (six) hours as needed for moderate pain. (Patient not taking: Reported on 09/04/2017) 12 tablet 0  . traMADol (ULTRAM) 50 MG tablet Take 1 tablet (50 mg total) by mouth every 6 (six) hours as needed. (Patient not taking: Reported on 09/04/2017) 20 tablet 0     Review of Systems Full ROS  was asked and was negative except for the information on the HPI  Physical Exam Blood pressure (!) 191/82, pulse (!) 101, temperature 99.1 F (37.3 C), temperature source Oral, resp. rate 16, height 5\' 1"  (1.549 m), weight 85.3 kg (188 lb), SpO2 99  %. CONSTITUTIONAL: NAD obese female EYES: Pupils are equal, round, and reactive to light, Sclera are non-icteric. EARS, NOSE, MOUTH AND THROAT: The oropharynx is clear. The oral mucosa is pink and moist. Hearing is intact to voice. LYMPH NODES:  Lymph nodes in the neck are normal. RESPIRATORY:  Lungs are clear. There is normal respiratory effort, with equal breath sounds bilaterally, and without pathologic use of accessory muscles. CARDIOVASCULAR: Heart is regular without murmurs, gallops, or rubs. GI: The abdomen is soft, tender to palpation on RLQ. No overt peritonitis/  GU: Rectal deferred.   MUSCULOSKELETAL: Normal muscle strength and tone. No cyanosis or edema.   SKIN: Turgor is good and there are no pathologic skin lesions or ulcers. NEUROLOGIC: Motor and sensation is grossly normal. Cranial nerves are grossly intact. PSYCH:  Oriented to person, place and time. Affect is normal.  Data Reviewed  I have personally reviewed the patient's imaging, laboratory findings and medical records.    Assessment/Plan  37 year old female with symptoms consistent with acute appendicitis and confirmed by CT scan.  Discussed with the patient detail about her disease process. I do recommend  appendectomy The risks, benefits, complications, treatment options, and expected outcomes were discussed with the patient. The treatment of antibiotics alone was discussed giving a 20% chance that this could fail and surgery would be necessary.  Also discussed continuing to the operating room for Laparoscopic Appendectomy.  The possibilities of  bleeding, recurrent infection, perforation of viscus, finding a normal appendix, the need for additional procedures, failure to diagnose a condition, conversion to open procedure and creating a complication requiring transfusion or further operations were discussed. The patient was given the opportunity to ask questions and have them answered.  Patient would like to proceed with  Laparoscopic Appendectomy and consent was obtained.  I will go ahead and give her a 1 L bolus and start Zosyn IV  Sterling Bigiego Pabon, MD FACS General Surgeon 09/04/2017, 4:15 PM

## 2017-09-04 NOTE — Anesthesia Preprocedure Evaluation (Signed)
Anesthesia Evaluation  Patient identified by MRN, date of birth, ID band Patient awake    Reviewed: Allergy & Precautions, NPO status , Patient's Chart, lab work & pertinent test results, reviewed documented beta blocker date and time   History of Anesthesia Complications Negative for: history of anesthetic complications  Airway Mallampati: II  TM Distance: >3 FB     Dental  (+) Chipped, Dental Advidsory Given   Pulmonary neg pulmonary ROS,           Cardiovascular Exercise Tolerance: Good negative cardio ROS       Neuro/Psych PSYCHIATRIC DISORDERS Anxiety negative neurological ROS     GI/Hepatic Neg liver ROS, GERD  ,  Endo/Other  negative endocrine ROS  Renal/GU negative Renal ROS     Musculoskeletal   Abdominal   Peds  Hematology   Anesthesia Other Findings Past Medical History: No date: Anxiety No date: Gallstones 07-20-15: UTI (urinary tract infection)  Obese  Reproductive/Obstetrics negative OB ROS                             Anesthesia Physical  Anesthesia Plan  ASA: II  Anesthesia Plan: General   Post-op Pain Management:    Induction: Intravenous, Rapid sequence and Cricoid pressure planned  PONV Risk Score and Plan: 2 and Ondansetron and Dexamethasone  Airway Management Planned: Oral ETT  Additional Equipment:   Intra-op Plan:   Post-operative Plan: Extubation in OR  Informed Consent: I have reviewed the patients History and Physical, chart, labs and discussed the procedure including the risks, benefits and alternatives for the proposed anesthesia with the patient or authorized representative who has indicated his/her understanding and acceptance.     Plan Discussed with: CRNA  Anesthesia Plan Comments:         Anesthesia Quick Evaluation

## 2017-09-04 NOTE — Anesthesia Post-op Follow-up Note (Signed)
Anesthesia QCDR form completed.        

## 2017-09-05 ENCOUNTER — Encounter: Payer: Self-pay | Admitting: Surgery

## 2017-09-05 ENCOUNTER — Other Ambulatory Visit: Payer: Self-pay

## 2017-09-05 ENCOUNTER — Telehealth: Payer: Self-pay

## 2017-09-05 MED ORDER — OXYCODONE-ACETAMINOPHEN 5-325 MG PO TABS: 1.0000 | ORAL_TABLET | Freq: Four times a day (QID) | ORAL | 0 refills | Status: DC | PRN

## 2017-09-05 MED ORDER — OXYCODONE HCL 5 MG PO TABS: 5.0000 mg | ORAL_TABLET | ORAL | 0 refills | Status: DC | PRN

## 2017-09-05 NOTE — Telephone Encounter (Signed)
Dr. Earlene Plateravis came in to the office and wanted me to write a prescription to one of his patient's that was just discharged. Patient had been back to the ED to let Dr. Earlene Plateravis know that the prescription that was given to her was too expensive. Therefore, Dr. Earlene Plateravis agreed on giving her percocet. Patient was called but had to leave her a detail message letting her know that she will need to come in to the office [facility]([place]) to pick up her prescription. Prescription was left at the front desk for patient to pick up.

## 2017-09-05 NOTE — Progress Notes (Signed)
Pt was given D/C instructions along with follow up appt. She stated she did not have insurance so I contacted Dr. Earlene Plateravis to see if she could get another prescription and he stated Percocet would be fine. He had to reprint the prescription so I let the pt go home and I will call her when it is ready. Her vitals were WDL and she stated she did not need a work note and she understood her instructions. IV was removed without incident and she was d/c'ed to her boyfriend.

## 2017-09-05 NOTE — Discharge Instructions (Signed)
In addition to included general post-operative instructions for Laparoscopic Appendectomy,  Diet: Resume home heart healthy diet.   Activity: No heavy lifting >20 pounds (children, pets, laundry, garbage) or strenuous activity until follow-up, but light activity and walking are encouraged. Do not drive or drink alcohol if taking narcotic pain medications.  Wound care: 2 days after surgery (Tuesday, 6/25), you may shower/get incision wet with soapy water and pat dry (do not rub incisions), but no baths or submerging incision underwater until follow-up.   Medications: Resume all home medications. For mild to moderate pain: acetaminophen (Tylenol) or ibuprofen/naproxen (if no kidney disease). Combining Tylenol with alcohol can substantially increase your risk of causing liver disease. Narcotic pain medications, if prescribed, can be used for severe pain, though may cause nausea, constipation, and drowsiness. Do not combine Tylenol and Percocet (or similar) within a 6 hour period as Percocet (and similar) contain(s) Tylenol. If you do not need the narcotic pain medication, you do not need to fill the prescription.  Call office [phone]) at any time if any questions, worsening pain, fevers/chills, bleeding, drainage from incision site, or other concerns.

## 2017-09-05 NOTE — Care Management Note (Signed)
Case Management Note  Patient Details  Name: Tammy Frost MRN: [id number] Date of Birth: [birth year]   Patient to discharge home today.  New script for pain medication at discharge.  Provided application to Medication Management  [facility]    Subjective/Objective:                    Action/Plan:   Expected Discharge Date:  09/05/17               Expected Discharge Plan:  Home/Self Care  In-House Referral:     Discharge planning Services  CM Consult, Indigent Health Clinic, Medication Assistance  Post Acute Care Choice:    Choice offered to:     DME Arranged:    DME Agency:     HH Arranged:    HH Agency:     Status of Service:  Completed, signed off  If discussed at [employer] Meetings, dates discussed:    Additional Comments:  Chapman FitchBOWEN, Jamekia Gannett T, RN 09/05/2017, 10:25 AM

## 2017-09-06 LAB — HIV ANTIBODY (ROUTINE TESTING W REFLEX): HIV Screen 4th Generation wRfx: NONREACTIVE

## 2017-09-07 LAB — SURGICAL PATHOLOGY

## 2017-09-12 NOTE — Discharge Summary (Signed)
Physician Discharge Summary  Patient ID: Tammy Frost MRN: [id number] DOB/AGE: [birth year] 37 y.o.  Admit date: 09/04/2017 Discharge date: 09/05/2017  Admission Diagnoses:  Discharge Diagnoses:  Active Problems:   Appendicitis   Discharged Condition: good  Hospital Course: 37 y.o. female [facility] pain. Workup was found to be significant for CT imaging demonstrating acute appendicitis. Informed consent was obtained and documented, and patient underwent uneventful laparoscopic appendectomy (Pabon, 09/04/2017).  Post-operatively, patient's pain improved/resolved and advancement of patient's diet and ambulation were well-tolerated. The remainder of patient's hospital course was essentially unremarkable, and discharge planning was initiated accordingly with patient safely able to be discharged home with appropriate discharge instructions, pain control, and outpatient surgical follow-up after all of her questions were answered to her expressed satisfaction.  Consults: None  Significant Diagnostic Studies: radiology: CT scan: acute appendicitis  Treatments: IV hydration, antibiotics, and surgery: laparoscopic appendectomy (Pabon, 09/04/2017)  Discharge Exam: Blood pressure 125/78, pulse 86, temperature 98.1 F (36.7 C), temperature source Oral, resp. rate 17, height 5\' 1"  (1.549 m), weight 188 lb (85.3 kg), SpO2 99 %. General appearance: alert, cooperative and no distress GI: abdomen soft and non-distended with incisions well-approximated, mild peri-incisional tenderness to palpation, and no surrounding erythema or drainage  Disposition:    Allergies as of 09/05/2017      Reactions   Flagyl [metronidazole] Hives, Rash   Patient states her skins burn.   Sulfa Antibiotics Hives, Rash   Patient states her skin burns.      Medication List    STOP taking these medications   traMADol 50 MG tablet Commonly known as:  ULTRAM     TAKE these medications    brompheniramine-pseudoephedrine-DM 30-2-10 MG/5ML syrup Take 5 mLs by mouth 4 (four) times daily as needed.   ibuprofen 800 MG tablet Commonly known as:  ADVIL,MOTRIN Take 1 tablet (800 mg total) by mouth every 8 (eight) hours as needed.   naproxen 500 MG tablet Commonly known as:  NAPROSYN Take 1 tablet (500 mg total) by mouth 2 (two) times daily with a meal.   oxyCODONE 5 MG immediate release tablet Commonly known as:  Oxy IR/ROXICODONE Take 1-2 tablets (5-10 mg total) by mouth every 4 (four) hours as needed for severe pain.      Follow-up Information    [facility] Go on 09/19/2017.   Specialty:  General Surgery Why:  Monday July 8th at 10:15am with Dr. Leonides GrillsPabon  Contact information: [address] [place] [id number] [phone]          Signed: Ancil LinseyJason Evan Gwendolynn Merkey 09/12/2017, 11:56 PM

## 2017-09-19 ENCOUNTER — Encounter: Payer: Self-pay | Admitting: Surgery

## 2017-09-21 ENCOUNTER — Telehealth: Payer: Self-pay | Admitting: Surgery

## 2017-09-21 NOTE — Telephone Encounter (Signed)
Left a message for the patient to call the office patient no showed appointment on 09/19/17 with Dr. Everlene FarrierPabon. Please r/s if patient calls back.

## 2017-10-03 ENCOUNTER — Other Ambulatory Visit: Payer: Self-pay

## 2017-10-03 ENCOUNTER — Emergency Department: Payer: Self-pay

## 2017-10-03 ENCOUNTER — Emergency Department
Admission: EM | Admit: 2017-10-03 | Discharge: 2017-10-03 | Disposition: A | Discharge status: Home / Self Care | Payer: Self-pay | Attending: Emergency Medicine | Admitting: Emergency Medicine

## 2017-10-03 ENCOUNTER — Encounter: Payer: Self-pay | Admitting: *Deleted

## 2017-10-03 DIAGNOSIS — B9789 Other viral agents as the cause of diseases classified elsewhere: Secondary | ICD-10-CM

## 2017-10-03 DIAGNOSIS — F419 Anxiety disorder, unspecified: Secondary | ICD-10-CM | POA: Insufficient documentation

## 2017-10-03 DIAGNOSIS — Z9049 Acquired absence of other specified parts of digestive tract: Secondary | ICD-10-CM | POA: Insufficient documentation

## 2017-10-03 DIAGNOSIS — J069 Acute upper respiratory infection, unspecified: Secondary | ICD-10-CM | POA: Insufficient documentation

## 2017-10-03 LAB — CBC
HCT: 37.2 % (ref 35.0–47.0)
HEMOGLOBIN: 12.7 g/dL (ref 12.0–16.0)
MCH: 28.3 pg (ref 26.0–34.0)
MCHC: 34 g/dL (ref 32.0–36.0)
MCV: 83 fL (ref 80.0–100.0)
Platelets: 227 10*3/uL (ref 150–440)
RBC: 4.49 MIL/uL (ref 3.80–5.20)
RDW: 13.7 % (ref 11.5–14.5)
WBC: 7.7 10*3/uL (ref 3.6–11.0)

## 2017-10-03 LAB — BASIC METABOLIC PANEL
ANION GAP: 6 (ref 5–15)
BUN: 5 mg/dL — ABNORMAL LOW (ref 6–20)
CO2: 25 mmol/L (ref 22–32)
Calcium: 9 mg/dL (ref 8.9–10.3)
Chloride: 106 mmol/L (ref 98–111)
Creatinine, Ser: 0.89 mg/dL (ref 0.44–1.00)
GFR calc non Af Amer: >60 mL/min (ref >60)
Glucose, Bld: 111 mg/dL — ABNORMAL HIGH (ref 70–99)
POTASSIUM: 3.6 mmol/L (ref 3.5–5.1)
SODIUM: 137 mmol/L (ref 135–145)

## 2017-10-03 LAB — POC URINE PREG, ED: Preg Test, Ur: NEGATIVE

## 2017-10-03 LAB — FIBRIN DERIVATIVES D-DIMER (ARMC ONLY): Fibrin derivatives D-dimer (ARMC): 249.17 ng/mL (FEU) (ref 0.00–499.00)

## 2017-10-03 LAB — TROPONIN I: Troponin I: <0.03 ng/mL (ref <0.03)

## 2017-10-03 MED ORDER — ALBUTEROL SULFATE HFA 108 (90 BASE) MCG/ACT IN AERS: 2.0000 | INHALATION_SPRAY | Freq: Four times a day (QID) | RESPIRATORY_TRACT | 2 refills | Status: DC | PRN

## 2017-10-03 MED ORDER — ALBUTEROL SULFATE (2.5 MG/3ML) 0.083% IN NEBU
INHALATION_SOLUTION | RESPIRATORY_TRACT | Status: AC
  Administered 2017-10-03: 2.5 mg via RESPIRATORY_TRACT
  Filled 2017-10-03: qty 3

## 2017-10-03 MED ORDER — ALBUTEROL SULFATE (2.5 MG/3ML) 0.083% IN NEBU
2.5000 mg | INHALATION_SOLUTION | Freq: Once | RESPIRATORY_TRACT | Status: AC
  Administered 2017-10-03: 2.5 mg via RESPIRATORY_TRACT

## 2017-10-03 NOTE — ED Notes (Signed)
ED Provider at bedside. 

## 2017-10-03 NOTE — ED Triage Notes (Signed)
Pt to ED reporting SOB x 3 days with a  Nonproductive cough. No chest pain. Pt also reporting lightheadedness and dizziness. No neuro deficits and no syncopal or near syncopal episodes reported.

## 2017-10-03 NOTE — ED Notes (Signed)
Pt c/o shortness of breath for 3 days. Has had non-productive cough. No hx asthma. Does not smoke cigarettes. C/o chest pressure x 3 days. States pain gets worse when she lays on right side.

## 2017-10-03 NOTE — ED Provider Notes (Signed)
[facility] Emergency Department Provider Note  ____________________________________________  Time seen: Approximately 7:08 PM  I have reviewed the triage vital signs and the nursing notes.   HISTORY  Chief Complaint Shortness of Breath and Cough   HPI Tammy Frost is a 37 y.o. female with a history of anxiety and recent appendectomy 30 days ago who presents for evaluation of shortness of breath and cough.  Patient reports 3 days of a dry cough, shortness of breath which is worse when she lays down and chest pain.  She describes the pain as pressure, located in the center of her chest, slightly worse with deep inspiration, constant and nonradiating.  The pain is not worse with exertion.  Patient has never smoked.  No history of COPD or asthma.  No family history of CAD, PE or DVT.  No leg swelling, no hemoptysis, no exogenous hormones.  Patient did have surgery 30 days ago.  No fever, no congestion, no sore throat, no body aches.   Past Medical History:  Diagnosis Date  . Anxiety   . Gallstones   . UTI (urinary tract infection) 07-20-15    Patient Active Problem List   Diagnosis Date Noted  . Appendicitis 09/04/2017  . Acute appendicitis   . Chronic cholecystitis with calculus   . Cholelithiasis 07/22/2015    Past Surgical History:  Procedure Laterality Date  . CESAREAN SECTION  12/27/2011  . CESAREAN SECTION  12/31/2013  . CHOLECYSTECTOMY N/A 09/02/2015   Procedure: LAPAROSCOPIC CHOLECYSTECTOMY WITH INTRAOPERATIVE CHOLANGIOGRAM;  Surgeon: Gladis Riffleatherine L Loflin, MD;  Location: [facility] ORS;  Service: General;  Laterality: N/A;  . LAPAROSCOPIC APPENDECTOMY N/A 09/04/2017   Procedure: APPENDECTOMY LAPAROSCOPIC;  Surgeon: Leafy RoPabon, Diego F, MD;  Location: [facility] ORS;  Service: General;  Laterality: N/A;    Prior to Admission medications   Medication Sig Start Date End Date Taking? Authorizing Provider  albuterol (PROVENTIL HFA;VENTOLIN HFA) 108 (90 Base) MCG/ACT  inhaler Inhale 2 puffs into the lungs every 6 (six) hours as needed for wheezing or shortness of breath. 10/03/17   Don PerkingVeronese, [place], MD  brompheniramine-pseudoephedrine-DM 30-2-10 MG/5ML syrup Take 5 mLs by mouth 4 (four) times daily as needed. Patient not taking: Reported on 09/04/2017 01/10/17   Joni ReiningSmith, Ronald K, PA-C  ibuprofen (ADVIL,MOTRIN) 800 MG tablet Take 1 tablet (800 mg total) by mouth every 8 (eight) hours as needed. Patient not taking: Reported on 09/04/2017 02/28/17   Joni ReiningSmith, Ronald K, PA-C  naproxen (NAPROSYN) 500 MG tablet Take 1 tablet (500 mg total) by mouth 2 (two) times daily with a meal. Patient not taking: Reported on 09/04/2017 01/10/17   Joni ReiningSmith, Ronald K, PA-C  oxyCODONE (OXY IR/ROXICODONE) 5 MG immediate release tablet Take 1-2 tablets (5-10 mg total) by mouth every 4 (four) hours as needed for severe pain. 09/05/17   Ancil Linseyavis, Jason Evan, MD  oxyCODONE-acetaminophen (PERCOCET) 5-325 MG tablet Take 1-2 tablets by mouth every 6 (six) hours as needed for severe pain. 09/05/17   Ancil Linseyavis, Jason Evan, MD    Allergies Flagyl [metronidazole] and Sulfa antibiotics  Family History  Problem Relation Age of Onset  . Cancer Maternal Grandmother        lung  . Emphysema Maternal Grandmother   . Cancer Maternal Grandfather        pancreatic  . Hypertension Mother   . Alcohol abuse Neg Hx   . Arthritis Neg Hx   . Asthma Neg Hx   . Birth defects Neg Hx   . COPD Neg  Hx   . Depression Neg Hx   . Diabetes Neg Hx   . Drug abuse Neg Hx   . Early death Neg Hx   . Hearing loss Neg Hx   . Heart disease Neg Hx   . Hyperlipidemia Neg Hx   . Kidney disease Neg Hx   . Learning disabilities Neg Hx   . Mental illness Neg Hx   . Mental retardation Neg Hx   . Miscarriages / Stillbirths Neg Hx   . Vision loss Neg Hx   . Stroke Neg Hx   . Varicose Veins Neg Hx     Social History Social History   Tobacco Use  . Smoking status: Never Smoker  . Smokeless tobacco: Never Used  Substance Use  Topics  . Alcohol use: No  . Drug use: No    Review of Systems  Constitutional: Negative for fever. Eyes: Negative for visual changes. ENT: Negative for sore throat. Neck: No neck pain  Cardiovascular: + chest pressure Respiratory: + shortness of breath, cough Gastrointestinal: Negative for abdominal pain, vomiting or diarrhea. Genitourinary: Negative for dysuria. Musculoskeletal: Negative for back pain. Skin: Negative for rash. Neurological: Negative for headaches, weakness or numbness. Psych: No SI or HI  ____________________________________________   PHYSICAL EXAM:  VITAL SIGNS: ED Triage Vitals  Enc Vitals Group     BP 10/03/17 1743 (!) 146/80     Pulse Rate 10/03/17 1743 (!) 106     Resp --      Temp 10/03/17 1743 99.2 F (37.3 C)     Temp Source 10/03/17 1743 Oral     SpO2 10/03/17 1743 97 %     Weight 10/03/17 1743 180 lb (81.6 kg)     Height 10/03/17 1743 5\' 2"  (1.575 m)     Head Circumference --      Peak Flow --      Pain Score 10/03/17 1745 3     Pain Loc --      Pain Edu? --      Excl. in GC? --     Constitutional: Alert and oriented. Well appearing and in no apparent distress. HEENT:      Head: Normocephalic and atraumatic.         Eyes: Conjunctivae are normal. Sclera is non-icteric.       Mouth/Throat: Mucous membranes are moist.       Neck: Supple with no signs of meningismus. Cardiovascular: Tachycardic with regular rhythm. No murmurs, gallops, or rubs. 2+ symmetrical distal pulses are present in all extremities. No JVD. Respiratory: Normal respiratory effort. Lungs are clear to auscultation bilaterally. No wheezes, crackles, or rhonchi.  Gastrointestinal: Soft, non tender, and non distended with positive bowel sounds. No rebound or guarding. Genitourinary: No CVA tenderness. Musculoskeletal: Nontender with normal range of motion in all extremities. No edema, cyanosis, or erythema of extremities. Neurologic: Normal speech and language. Face is  symmetric. Moving all extremities. No gross focal neurologic deficits are appreciated. Skin: Skin is warm, dry and intact. No rash noted. Psychiatric: Mood and affect are normal. Speech and behavior are normal.  ____________________________________________   LABS (all labs ordered are listed, but only abnormal results are displayed)  Labs Reviewed  BASIC METABOLIC PANEL - Abnormal; Notable for the following components:      Result Value   Glucose, Bld 111 (*)    BUN 5 (*)    All other components within normal limits  CBC  TROPONIN I  FIBRIN DERIVATIVES D-DIMER ([facility] ONLY)  POC URINE PREG, ED   ____________________________________________  EKG  ED ECG REPORT I, Nita Sickle, the attending physician, personally viewed and interpreted this ECG.  Normal sinus rhythm, rate of 95, normal intervals, normal axis, no ST elevations or depressions.  Normal EKG ____________________________________________  RADIOLOGY  I have personally reviewed the images performed during this visit and I agree with the Radiologist's read.   Interpretation by Radiologist:  Dg Chest 2 View  Result Date: 10/03/2017 CLINICAL DATA:  Pt reports feeling short of breath x 3 days. States she came into contact with black mold and became concerned it was the cause of her trouble breathing. Denies any other symptoms. EXAM: CHEST - 2 VIEW COMPARISON:  01/10/2017 FINDINGS: Lungs are clear. Heart size and mediastinal contours are within normal limits. No effusion. Visualized bones unremarkable.  Cholecystectomy clips. IMPRESSION: No acute cardiopulmonary disease. Electronically Signed   By: Corlis Leak M.D.   On: 10/03/2017 18:19     ____________________________________________   PROCEDURES  Procedure(s) performed: None Procedures Critical Care performed:  None ____________________________________________   INITIAL IMPRESSION / ASSESSMENT AND PLAN / ED COURSE  37 y.o. female with a history of anxiety  and recent appendectomy 30 days ago who presents for evaluation of chest pressure, shortness of breath and cough.  Patient is in no apparent distress, normal work of breathing, normal sats, lung exam is within normal limits.  Patient has a low-grade temp of 99.19F and is tachycardic with a pulse of 106, there is no pitting edema.Marland Kitchen PERC positive will check d-dimer. CXR negative for PNA or PTX. Heart score of 0 with normal EKG and negative troponin. No indication for repeat troponin with constant symptoms x3 days. Presentation concerning for viral URI/ bronchitis.    _________________________ 7:23 PM on 10/03/2017 -----------------------------------------  D-dimer is negative.  Patient remains well-appearing and in no respiratory distress.  After 1 treatment of albuterol she reports improvement of her shortness of breath and chest tightness consistent with a viral URI/bronchitis.  She will be given a prescription for albuterol.  Recommended close follow-up with primary care doctor.  Discussed return precautions for fever, new or worsening chest pain or shortness of breath.   As part of my medical decision making, I reviewed the following data within the electronic MEDICAL RECORD NUMBER Nursing notes reviewed and incorporated, Labs reviewed , EKG interpreted , Old chart reviewed, Radiograph reviewed , Notes from prior ED visits and [place] Controlled Substance Database    Pertinent labs & imaging results that were available during my care of the patient were reviewed by me and considered in my medical decision making (see chart for details).    ____________________________________________   FINAL CLINICAL IMPRESSION(S) / ED DIAGNOSES  Final diagnoses:  Viral URI with cough      NEW MEDICATIONS STARTED DURING THIS VISIT:  ED Discharge Orders        Ordered    albuterol (PROVENTIL HFA;VENTOLIN HFA) 108 (90 Base) MCG/ACT inhaler  Every 6 hours PRN     10/03/17 1922       Note:  This document was  prepared using Dragon voice recognition software and may include unintentional dictation errors.    Don Perking, [place], MD 10/03/17 [phone]

## 2017-10-07 NOTE — Telephone Encounter (Signed)
Unable to leave a message for patient to call the office, I have mailed a letter for the patient to contact our office.

## 2017-12-20 ENCOUNTER — Emergency Department
Admission: EM | Admit: 2017-12-20 | Discharge: 2017-12-20 | Disposition: A | Discharge status: Home / Self Care | Payer: Self-pay | Attending: Emergency Medicine | Admitting: Emergency Medicine

## 2017-12-20 ENCOUNTER — Emergency Department: Payer: Self-pay

## 2017-12-20 ENCOUNTER — Other Ambulatory Visit: Payer: Self-pay

## 2017-12-20 DIAGNOSIS — J9801 Acute bronchospasm: Secondary | ICD-10-CM | POA: Insufficient documentation

## 2017-12-20 MED ORDER — METHYLPREDNISOLONE 4 MG PO TBPK: ORAL_TABLET | ORAL | 0 refills | Status: DC

## 2017-12-20 MED ORDER — HYDROCOD POLST-CPM POLST ER 10-8 MG/5ML PO SUER
5.0000 mL | Freq: Once | ORAL | Status: AC
  Administered 2017-12-20: 5 mL via ORAL
  Filled 2017-12-20: qty 5

## 2017-12-20 MED ORDER — HYDROCOD POLST-CPM POLST ER 10-8 MG/5ML PO SUER: 5.0000 mL | Freq: Two times a day (BID) | ORAL | 0 refills | Status: DC

## 2017-12-20 MED ORDER — HYDROCOD POLST-CPM POLST ER 10-8 MG/5ML PO SUER: 5.0000 mL | Freq: Once | ORAL | Status: DC

## 2017-12-20 NOTE — ED Provider Notes (Signed)
[facility] Emergency Department Provider Note   ____________________________________________   First MD Initiated Contact with Patient 12/20/17 0715     (approximate)  I have reviewed the triage vital signs and the nursing notes.   HISTORY  Chief Complaint Cough    HPI Tammy Frost is a 37 y.o. female patient presents with 2 weeks of nonproductive cough and chest congestion.  Patient denies nasal congestion, fever, chills, or nausea.  Patient states loose stools secondary to removal of gallbladder and appendix.  Patient states cough increase with deep inspirations but denies dyspnea or chest pain.  No relief with over-the-counter cough preparations.  Past Medical History:  Diagnosis Date  . Anxiety   . Gallstones   . UTI (urinary tract infection) 07-20-15    Patient Active Problem List   Diagnosis Date Noted  . Appendicitis 09/04/2017  . Acute appendicitis   . Chronic cholecystitis with calculus   . Cholelithiasis 07/22/2015    Past Surgical History:  Procedure Laterality Date  . CESAREAN SECTION  12/27/2011  . CESAREAN SECTION  12/31/2013  . CHOLECYSTECTOMY N/A 09/02/2015   Procedure: LAPAROSCOPIC CHOLECYSTECTOMY WITH INTRAOPERATIVE CHOLANGIOGRAM;  Surgeon: Gladis Riffle, MD;  Location: [facility] ORS;  Service: General;  Laterality: N/A;  . LAPAROSCOPIC APPENDECTOMY N/A 09/04/2017   Procedure: APPENDECTOMY LAPAROSCOPIC;  Surgeon: Leafy Ro, MD;  Location: [facility] ORS;  Service: General;  Laterality: N/A;    Prior to Admission medications   Medication Sig Start Date End Date Taking? Authorizing Provider  albuterol (PROVENTIL HFA;VENTOLIN HFA) 108 (90 Base) MCG/ACT inhaler Inhale 2 puffs into the lungs every 6 (six) hours as needed for wheezing or shortness of breath. 10/03/17   Don Perking, [place], MD  brompheniramine-pseudoephedrine-DM 30-2-10 MG/5ML syrup Take 5 mLs by mouth 4 (four) times daily as needed. Patient not taking: Reported on  09/04/2017 01/10/17   Joni Reining, PA-C  chlorpheniramine-HYDROcodone [facility] ER) 10-8 MG/5ML SUER Take 5 mLs by mouth 2 (two) times daily. 12/20/17   Joni Reining, PA-C  ibuprofen (ADVIL,MOTRIN) 800 MG tablet Take 1 tablet (800 mg total) by mouth every 8 (eight) hours as needed. Patient not taking: Reported on 09/04/2017 02/28/17   Joni Reining, PA-C  methylPREDNISolone (MEDROL DOSEPAK) 4 MG TBPK tablet Take Tapered dose as directed 12/20/17   Joni Reining, PA-C  naproxen (NAPROSYN) 500 MG tablet Take 1 tablet (500 mg total) by mouth 2 (two) times daily with a meal. Patient not taking: Reported on 09/04/2017 01/10/17   Joni Reining, PA-C  oxyCODONE (OXY IR/ROXICODONE) 5 MG immediate release tablet Take 1-2 tablets (5-10 mg total) by mouth every 4 (four) hours as needed for severe pain. 09/05/17   Ancil Linsey, MD  oxyCODONE-acetaminophen (PERCOCET) 5-325 MG tablet Take 1-2 tablets by mouth every 6 (six) hours as needed for severe pain. 09/05/17   Ancil Linsey, MD    Allergies Flagyl [metronidazole] and Sulfa antibiotics  Family History  Problem Relation Age of Onset  . Cancer Maternal Grandmother        lung  . Emphysema Maternal Grandmother   . Cancer Maternal Grandfather        pancreatic  . Hypertension Mother   . Alcohol abuse Neg Hx   . Arthritis Neg Hx   . Asthma Neg Hx   . Birth defects Neg Hx   . COPD Neg Hx   . Depression Neg Hx   . Diabetes Neg Hx   . Drug abuse Neg  Hx   . Early death Neg Hx   . Hearing loss Neg Hx   . Heart disease Neg Hx   . Hyperlipidemia Neg Hx   . Kidney disease Neg Hx   . Learning disabilities Neg Hx   . Mental illness Neg Hx   . Mental retardation Neg Hx   . Miscarriages / Stillbirths Neg Hx   . Vision loss Neg Hx   . Stroke Neg Hx   . Varicose Veins Neg Hx     Social History Social History   Tobacco Use  . Smoking status: Never Smoker  . Smokeless tobacco: Never Used  Substance Use Topics  .  Alcohol use: No  . Drug use: No    Review of Systems  Constitutional: No fever/chills Eyes: No visual changes. ENT: No sore throat. Cardiovascular: Denies chest pain. Respiratory: Denies shortness of breath.  Nonproductive cough. Gastrointestinal: No abdominal pain.  No nausea, no vomiting.  No diarrhea.  No constipation. Genitourinary: Negative for dysuria. Musculoskeletal: Negative for back pain. Skin: Negative for rash. Neurological: Negative for headaches, focal weakness or numbness. Psychiatric:Anxiety Hematological/Lymphatic: Allergic/Immunilogical: Flagyl sulfa antibiotics ____________________________________________   PHYSICAL EXAM:  VITAL SIGNS: ED Triage Vitals [12/20/17 0706]  Enc Vitals Group     BP (!) 161/84     Pulse Rate 99     Resp 18     Temp 97.8 F (36.6 C)     Temp Source Oral     SpO2 99 %     Weight 188 lb (85.3 kg)     Height 5\' 2"  (1.575 m)     Head Circumference      Peak Flow      Pain Score 7     Pain Loc      Pain Edu?      Excl. in GC?     Constitutional: Alert and oriented. Well appearing and in no acute distress. Eyes: Conjunctivae are normal. PERRL. EOMI. Head: Atraumatic. Nose: No congestion/rhinnorhea. Mouth/Throat: Mucous membranes are moist.  Oropharynx non-erythematous. Cardiovascular: Normal rate, regular rhythm. Grossly normal heart sounds.  Good peripheral circulation.  Elevated blood pressure Respiratory: Normal respiratory effort.  No retractions. Lungs CTAB. Gastrointestinal: Soft and nontender. No distention. No abdominal bruits. No CVA tenderness. Musculoskeletal: No lower extremity tenderness nor edema.  No joint effusions. Neurologic:  Normal speech and language. No gross focal neurologic deficits are appreciated. No gait instability. Skin:  Skin is warm, dry and intact. No rash noted. Psychiatric: Mood and affect are normal. Speech and behavior are normal.  ____________________________________________    LABS (all labs ordered are listed, but only abnormal results are displayed)  Labs Reviewed - No data to display ____________________________________________  EKG   ____________________________________________  RADIOLOGY  ED MD interpretation:    Official radiology report(s): Dg Chest 2 View  Result Date: 12/20/2017 CLINICAL DATA:  Nonproductive cough EXAM: CHEST - 2 VIEW COMPARISON:  10/03/2017 FINDINGS: Heart and mediastinal contours are within normal limits. No focal opacities or effusions. No acute bony abnormality. IMPRESSION: No active cardiopulmonary disease. Electronically Signed   By: Charlett Nose M.D.   On: 12/20/2017 08:12    ____________________________________________   PROCEDURES  Procedure(s) performed: None  Procedures  Critical Care performed: No  ____________________________________________   INITIAL IMPRESSION / ASSESSMENT AND PLAN / ED COURSE  As part of my medical decision making, I reviewed the following data within the electronic MEDICAL RECORD NUMBER    Persistent cough secondary to bronchospasm.  Discussed negative chest x-ray  findings with patient.  Patient given discharge care instruction advised take medication as directed.  [facility]      ____________________________________________   FINAL CLINICAL IMPRESSION(S) / ED DIAGNOSES  Final diagnoses:  Cough due to bronchospasm     ED Discharge Orders         Ordered    chlorpheniramine-HYDROcodone (TUSSIONEX PENNKINETIC ER) 10-8 MG/5ML SUER  2 times daily     12/20/17 0836    methylPREDNISolone (MEDROL DOSEPAK) 4 MG TBPK tablet     12/20/17 1610           Note:  This document was prepared using Dragon voice recognition software and may include unintentional dictation errors.    Joni Reining, PA-C 12/20/17 9604    Emily Filbert, MD 12/20/17 [phone]

## 2017-12-20 NOTE — ED Notes (Signed)
See triage note  presents with 2-3 week hx of cough   States cough is occasionally prod  Denies any fever  States she has been seen at Urgent care and given an inhaler  And has been using OTC cough meds

## 2017-12-20 NOTE — ED Triage Notes (Signed)
Pt c/o cough with congestion for the past 2 weeks.

## 2018-02-01 ENCOUNTER — Emergency Department
Admission: EM | Admit: 2018-02-01 | Discharge: 2018-02-01 | Disposition: A | Discharge status: Home / Self Care | Payer: Self-pay | Attending: Emergency Medicine | Admitting: Emergency Medicine

## 2018-02-01 ENCOUNTER — Encounter: Payer: Self-pay | Admitting: Emergency Medicine

## 2018-02-01 ENCOUNTER — Emergency Department: Payer: Self-pay

## 2018-02-01 ENCOUNTER — Other Ambulatory Visit: Payer: Self-pay

## 2018-02-01 DIAGNOSIS — J069 Acute upper respiratory infection, unspecified: Secondary | ICD-10-CM | POA: Insufficient documentation

## 2018-02-01 DIAGNOSIS — Z79899 Other long term (current) drug therapy: Secondary | ICD-10-CM | POA: Insufficient documentation

## 2018-02-01 MED ORDER — PSEUDOEPH-BROMPHEN-DM 30-2-10 MG/5ML PO SYRP: 5.0000 mL | ORAL_SOLUTION | Freq: Four times a day (QID) | ORAL | 0 refills | Status: DC | PRN

## 2018-02-01 MED ORDER — AZITHROMYCIN 250 MG PO TABS: ORAL_TABLET | ORAL | 0 refills | Status: DC

## 2018-02-01 NOTE — Discharge Instructions (Signed)
Follow-up with 1 the clinics listed on your discharge papers.  Begin taking antibiotics and Bromfed-DM for cough and congestion.  Increase fluids.  Tylenol or ibuprofen if needed for inflammation, muscle aches, or headache.  Continue using your albuterol inhaler as needed for cough or shortness of breath.

## 2018-02-01 NOTE — ED Triage Notes (Signed)
States she developed cough about 2 months ago  No fever  Has tried OTC meds for same  W/o relief

## 2018-02-01 NOTE — ED Provider Notes (Signed)
[facility] Emergency Department Provider Note  ____________________________________________   First MD Initiated Contact with Patient 02/01/18 1011     (approximate)  I have reviewed the triage vital signs and the nursing notes.   HISTORY  Chief Complaint Cough   HPI Tammy Frost is a 37 y.o. female presents to the ED today with complaint of productive cough for 2 months.  Patient states that she was seen in the ED on 12/20/2017 at which time she was diagnosed with bronchitis but states that she has not improved since that time.  She denies any fever, chills, nausea or vomiting.  She denies history of smoking or asthma as a child.  She has been taking over-the-counter medication without any relief.   Past Medical History:  Diagnosis Date  . Anxiety   . Gallstones   . UTI (urinary tract infection) 07-20-15    Patient Active Problem List   Diagnosis Date Noted  . Appendicitis 09/04/2017  . Acute appendicitis   . Chronic cholecystitis with calculus   . Cholelithiasis 07/22/2015    Past Surgical History:  Procedure Laterality Date  . CESAREAN SECTION  12/27/2011  . CESAREAN SECTION  12/31/2013  . CHOLECYSTECTOMY N/A 09/02/2015   Procedure: LAPAROSCOPIC CHOLECYSTECTOMY WITH INTRAOPERATIVE CHOLANGIOGRAM;  Surgeon: Gladis Riffle, MD;  Location: [facility] ORS;  Service: General;  Laterality: N/A;  . LAPAROSCOPIC APPENDECTOMY N/A 09/04/2017   Procedure: APPENDECTOMY LAPAROSCOPIC;  Surgeon: Leafy Ro, MD;  Location: [facility] ORS;  Service: General;  Laterality: N/A;    Prior to Admission medications   Medication Sig Start Date End Date Taking? Authorizing Provider  albuterol (PROVENTIL HFA;VENTOLIN HFA) 108 (90 Base) MCG/ACT inhaler Inhale 2 puffs into the lungs every 6 (six) hours as needed for wheezing or shortness of breath. 10/03/17   Don Perking, [place], MD  azithromycin (ZITHROMAX Z-PAK) 250 MG tablet Take 2 tablets (500 mg) on  Day 1,  followed by 1  tablet (250 mg) once daily on Days 2 through 5. 02/01/18   Tommi Rumps, PA-C  brompheniramine-pseudoephedrine-DM 30-2-10 MG/5ML syrup Take 5 mLs by mouth 4 (four) times daily as needed. 02/01/18   Tommi Rumps, PA-C  oxyCODONE (OXY IR/ROXICODONE) 5 MG immediate release tablet Take 1-2 tablets (5-10 mg total) by mouth every 4 (four) hours as needed for severe pain. 09/05/17   Ancil Linsey, MD  oxyCODONE-acetaminophen (PERCOCET) 5-325 MG tablet Take 1-2 tablets by mouth every 6 (six) hours as needed for severe pain. 09/05/17   Ancil Linsey, MD    Allergies Flagyl [metronidazole] and Sulfa antibiotics  Family History  Problem Relation Age of Onset  . Cancer Maternal Grandmother        lung  . Emphysema Maternal Grandmother   . Cancer Maternal Grandfather        pancreatic  . Hypertension Mother   . Alcohol abuse Neg Hx   . Arthritis Neg Hx   . Asthma Neg Hx   . Birth defects Neg Hx   . COPD Neg Hx   . Depression Neg Hx   . Diabetes Neg Hx   . Drug abuse Neg Hx   . Early death Neg Hx   . Hearing loss Neg Hx   . Heart disease Neg Hx   . Hyperlipidemia Neg Hx   . Kidney disease Neg Hx   . Learning disabilities Neg Hx   . Mental illness Neg Hx   . Mental retardation Neg Hx   . Miscarriages /  Stillbirths Neg Hx   . Vision loss Neg Hx   . Stroke Neg Hx   . Varicose Veins Neg Hx     Social History Social History   Tobacco Use  . Smoking status: Never Smoker  . Smokeless tobacco: Never Used  Substance Use Topics  . Alcohol use: No  . Drug use: No    Review of Systems Constitutional: No fever/chills Eyes: No visual changes. ENT: No sore throat.  Negative for ear pain. Cardiovascular: Denies chest pain. Respiratory: Denies shortness of breath.  Positive for productive cough x2 months. Gastrointestinal: No abdominal pain.  No nausea, no vomiting.  Musculoskeletal: Negative for back pain. Skin: Negative for rash. Neurological: Negative for headaches,  focal weakness or numbness. ___________________________________________   PHYSICAL EXAM:  VITAL SIGNS: ED Triage Vitals  Enc Vitals Group     BP 02/01/18 0956 (!) 145/83     Pulse Rate 02/01/18 0956 98     Resp 02/01/18 0956 18     Temp 02/01/18 0956 98.2 F (36.8 C)     Temp Source 02/01/18 0956 Oral     SpO2 02/01/18 0956 98 %     Weight 02/01/18 0957 189 lb (85.7 kg)     Height 02/01/18 0957 5\' 2"  (1.575 m)     Head Circumference --      Peak Flow --      Pain Score --      Pain Loc --      Pain Edu? --      Excl. in GC? --    Constitutional: Alert and oriented. Well appearing and in no acute distress. Eyes: Conjunctivae are normal.  Head: Atraumatic. Nose: Mild congestion/rhinnorhea.  EACs are clear.  TMs are dull bilaterally without erythema or injection. Mouth/Throat: Mucous membranes are moist.  Oropharynx non-erythematous.  Moderate posterior drainage. Neck: No stridor.   Hematological/Lymphatic/Immunilogical: No cervical lymphadenopathy. Cardiovascular: Normal rate, regular rhythm. Grossly normal heart sounds.  Good peripheral circulation. Respiratory: Normal respiratory effort.  No retractions. Lungs CTAB. Gastrointestinal: Soft and nontender. No distention.  Musculoskeletal: Moves upper and lower extremities without any difficulty.  Normal gait was noted. Neurologic:  Normal speech and language. No gross focal neurologic deficits are appreciated.  Skin:  Skin is warm, dry and intact.  Psychiatric: Mood and affect are normal. Speech and behavior are normal.  ____________________________________________   LABS (all labs ordered are listed, but only abnormal results are displayed)  Labs Reviewed  POC URINE PREG, ED    RADIOLOGY  ED MD interpretation:   Chest x-ray is negative for infiltrate or acute changes.  Official radiology report(s): Dg Chest 2 View  Result Date: 02/01/2018 CLINICAL DATA:  Cough EXAM: CHEST - 2 VIEW COMPARISON:  12/20/2017  FINDINGS: Heart and mediastinal contours are within normal limits. No focal opacities or effusions. No acute bony abnormality. IMPRESSION: No active cardiopulmonary disease. Electronically Signed   By: Charlett Nose M.D.   On: 02/01/2018 10:35    ____________________________________________   PROCEDURES  Procedure(s) performed: None  Procedures  Critical Care performed: No  ____________________________________________   INITIAL IMPRESSION / ASSESSMENT AND PLAN / ED COURSE  As part of my medical decision making, I reviewed the following data within the electronic MEDICAL RECORD NUMBER Notes from prior ED visits and [place] Controlled Substance Database  Patient presents to the ED with complaint of productive cough for 2 months.  Patient states she was treated for bronchitis on her last visit which was early October without any improvement.  She denies any fever, chills, nausea or vomiting and there was no history of childhood asthma.  Patient is a non-smoker.  Physical exam was positive for nasal congestion and a cough.  Chest x-ray was reassuring and no infiltrate or cardio pulmonary disease noted.  Patient was given a prescription for Bromfed-DM 4 times daily as needed for symptoms and a Z-Pak.  Patient was given a list of clinics to follow-up with as she does not have a PCP presently.  ____________________________________________   FINAL CLINICAL IMPRESSION(S) / ED DIAGNOSES  Final diagnoses:  Upper respiratory tract infection, unspecified type     ED Discharge Orders         Ordered    brompheniramine-pseudoephedrine-DM 30-2-10 MG/5ML syrup  4 times daily PRN,   Status:  Discontinued     02/01/18 1048    azithromycin (ZITHROMAX Z-PAK) 250 MG tablet  Status:  Discontinued     02/01/18 1048    brompheniramine-pseudoephedrine-DM 30-2-10 MG/5ML syrup  4 times daily PRN     02/01/18 1056    azithromycin (ZITHROMAX Z-PAK) 250 MG tablet     02/01/18 1056           Note:  This  document was prepared using Dragon voice recognition software and may include unintentional dictation errors.    Tommi RumpsSummers, Rhonda L, PA-C 02/01/18 1126    Arnaldo NatalMalinda, Paul F, MD 02/01/18 [phone]

## 2018-04-20 ENCOUNTER — Encounter: Payer: Self-pay | Admitting: Emergency Medicine

## 2018-04-20 ENCOUNTER — Emergency Department
Admission: EM | Admit: 2018-04-20 | Discharge: 2018-04-20 | Disposition: A | Discharge status: Home / Self Care | Payer: Self-pay | Attending: Emergency Medicine | Admitting: Emergency Medicine

## 2018-04-20 DIAGNOSIS — R0981 Nasal congestion: Secondary | ICD-10-CM | POA: Insufficient documentation

## 2018-04-20 DIAGNOSIS — Z5321 Procedure and treatment not carried out due to patient leaving prior to being seen by health care provider: Secondary | ICD-10-CM | POA: Insufficient documentation

## 2018-04-20 DIAGNOSIS — R509 Fever, unspecified: Secondary | ICD-10-CM | POA: Insufficient documentation

## 2018-04-20 LAB — COMPREHENSIVE METABOLIC PANEL
ALBUMIN: 4.2 g/dL (ref 3.5–5.0)
ALT: 25 U/L (ref 0–44)
AST: 18 U/L (ref 15–41)
Alkaline Phosphatase: 53 U/L (ref 38–126)
Anion gap: 7 (ref 5–15)
BUN: 8 mg/dL (ref 6–20)
CO2: 23 mmol/L (ref 22–32)
Calcium: 8.9 mg/dL (ref 8.9–10.3)
Chloride: 104 mmol/L (ref 98–111)
Creatinine, Ser: 0.93 mg/dL (ref 0.44–1.00)
GFR calc Af Amer: >60 mL/min (ref >60)
GFR calc non Af Amer: >60 mL/min (ref >60)
Glucose, Bld: 107 mg/dL — ABNORMAL HIGH (ref 70–99)
Potassium: 3.6 mmol/L (ref 3.5–5.1)
Sodium: 134 mmol/L — ABNORMAL LOW (ref 135–145)
Total Bilirubin: 0.9 mg/dL (ref 0.3–1.2)
Total Protein: 7.9 g/dL (ref 6.5–8.1)

## 2018-04-20 LAB — CBC
HCT: 38.4 % (ref 36.0–46.0)
Hemoglobin: 12.2 g/dL (ref 12.0–15.0)
MCH: 26.2 pg (ref 26.0–34.0)
MCHC: 31.8 g/dL (ref 30.0–36.0)
MCV: 82.6 fL (ref 80.0–100.0)
Platelets: 251 10*3/uL (ref 150–400)
RBC: 4.65 MIL/uL (ref 3.87–5.11)
RDW: 12.3 % (ref 11.5–15.5)
WBC: 11.1 10*3/uL — ABNORMAL HIGH (ref 4.0–10.5)
nRBC: 0 % (ref 0.0–0.2)

## 2018-04-20 LAB — LIPASE, BLOOD: Lipase: 20 U/L (ref 11–51)

## 2018-04-20 MED ORDER — ONDANSETRON 4 MG PO TBDP
4.0000 mg | ORAL_TABLET | Freq: Once | ORAL | Status: AC | PRN
  Administered 2018-04-20: 4 mg via ORAL
  Filled 2018-04-20: qty 1

## 2018-04-20 NOTE — ED Notes (Signed)
No answer when called several times from lobby 

## 2018-04-20 NOTE — ED Notes (Signed)
Discussed ordering flu swab with Dr. Marisa Severin.  He stated he did not feel it was necessary at this time.

## 2018-04-20 NOTE — ED Triage Notes (Signed)
Patient presents to the ED with cough, congestion, fever, nausea and vomiting that began this morning.  Patient states she took motrin at 4pm.  Patient reports vomiting x 4 this am.  Patient is in no obvious distress at this time.

## 2018-05-27 ENCOUNTER — Other Ambulatory Visit: Payer: Self-pay

## 2018-05-27 ENCOUNTER — Emergency Department
Admission: EM | Admit: 2018-05-27 | Discharge: 2018-05-27 | Disposition: A | Discharge status: Home / Self Care | Payer: Self-pay | Attending: Emergency Medicine | Admitting: Emergency Medicine

## 2018-05-27 ENCOUNTER — Encounter: Payer: Self-pay | Admitting: Emergency Medicine

## 2018-05-27 ENCOUNTER — Emergency Department: Payer: Self-pay

## 2018-05-27 DIAGNOSIS — J069 Acute upper respiratory infection, unspecified: Secondary | ICD-10-CM | POA: Insufficient documentation

## 2018-05-27 LAB — GROUP A STREP BY PCR: GROUP A STREP BY PCR: NOT DETECTED

## 2018-05-27 MED ORDER — FLUTICASONE PROPIONATE 50 MCG/ACT NA SUSP: 2.0000 | Freq: Every day | NASAL | 0 refills | Status: DC

## 2018-05-27 MED ORDER — AZITHROMYCIN 250 MG PO TABS: ORAL_TABLET | ORAL | 0 refills | Status: DC

## 2018-05-27 MED ORDER — BENZONATATE 100 MG PO CAPS: 100.0000 mg | ORAL_CAPSULE | Freq: Three times a day (TID) | ORAL | 0 refills | Status: DC | PRN

## 2018-05-27 NOTE — ED Triage Notes (Signed)
Pt presents with sore throat, nasal congestion, and cough x 2 weeks. She has been taking dayquil and nyquil with some relief. She reports that she is feeling a bit better today but that she now feels like she is losing her voice. NAD noted.

## 2018-05-27 NOTE — ED Provider Notes (Signed)
[facility] Emergency Department Provider Note  ____________________________________________  Time seen: Approximately 1:48 PM  I have reviewed the triage vital signs and the nursing notes.   HISTORY  Chief Complaint Cough and Sore Throat    HPI Tammy Frost is a 38 y.o. female that presents to the emergency department for evaluation of nasal congestion, sore throat, nonproductive cough for 2 weeks.  Patient states that she has had a couple of episodes of diarrhea.  No known fever.  Both children are sick.  Patient does not smoke.  No shortness of breath, chest pain, vomiting.  Past Medical History:  Diagnosis Date  . Anxiety   . Gallstones   . UTI (urinary tract infection) 07-20-15    Patient Active Problem List   Diagnosis Date Noted  . Appendicitis 09/04/2017  . Acute appendicitis   . Chronic cholecystitis with calculus   . Cholelithiasis 07/22/2015    Past Surgical History:  Procedure Laterality Date  . CESAREAN SECTION  12/27/2011  . CESAREAN SECTION  12/31/2013  . CHOLECYSTECTOMY N/A 09/02/2015   Procedure: LAPAROSCOPIC CHOLECYSTECTOMY WITH INTRAOPERATIVE CHOLANGIOGRAM;  Surgeon: Gladis Riffle, MD;  Location: [facility] ORS;  Service: General;  Laterality: N/A;  . LAPAROSCOPIC APPENDECTOMY N/A 09/04/2017   Procedure: APPENDECTOMY LAPAROSCOPIC;  Surgeon: Leafy Ro, MD;  Location: [facility] ORS;  Service: General;  Laterality: N/A;    Prior to Admission medications   Medication Sig Start Date End Date Taking? Authorizing Provider  albuterol (PROVENTIL HFA;VENTOLIN HFA) 108 (90 Base) MCG/ACT inhaler Inhale 2 puffs into the lungs every 6 (six) hours as needed for wheezing or shortness of breath. 10/03/17   Don Perking, [place], MD  azithromycin (ZITHROMAX Z-PAK) 250 MG tablet Take 2 tablets (500 mg) on  Day 1,  followed by 1 tablet (250 mg) once daily on Days 2 through 5. 05/27/18   Enid Derry, PA-C  benzonatate (TESSALON PERLES) 100 MG capsule Take  1 capsule (100 mg total) by mouth 3 (three) times daily as needed for cough. 05/27/18 05/27/19  Enid Derry, PA-C  brompheniramine-pseudoephedrine-DM 30-2-10 MG/5ML syrup Take 5 mLs by mouth 4 (four) times daily as needed. 02/01/18   Tommi Rumps, PA-C  fluticasone (FLONASE) 50 MCG/ACT nasal spray Place 2 sprays into both nostrils daily. 05/27/18 05/27/19  Enid Derry, PA-C  oxyCODONE (OXY IR/ROXICODONE) 5 MG immediate release tablet Take 1-2 tablets (5-10 mg total) by mouth every 4 (four) hours as needed for severe pain. 09/05/17   Ancil Linsey, MD  oxyCODONE-acetaminophen (PERCOCET) 5-325 MG tablet Take 1-2 tablets by mouth every 6 (six) hours as needed for severe pain. 09/05/17   Ancil Linsey, MD    Allergies Flagyl [metronidazole] and Sulfa antibiotics  Family History  Problem Relation Age of Onset  . Cancer Maternal Grandmother        lung  . Emphysema Maternal Grandmother   . Cancer Maternal Grandfather        pancreatic  . Hypertension Mother   . Alcohol abuse Neg Hx   . Arthritis Neg Hx   . Asthma Neg Hx   . Birth defects Neg Hx   . COPD Neg Hx   . Depression Neg Hx   . Diabetes Neg Hx   . Drug abuse Neg Hx   . Early death Neg Hx   . Hearing loss Neg Hx   . Heart disease Neg Hx   . Hyperlipidemia Neg Hx   . Kidney disease Neg Hx   . Learning disabilities  Neg Hx   . Mental illness Neg Hx   . Mental retardation Neg Hx   . Miscarriages / Stillbirths Neg Hx   . Vision loss Neg Hx   . Stroke Neg Hx   . Varicose Veins Neg Hx     Social History Social History   Tobacco Use  . Smoking status: Never Smoker  . Smokeless tobacco: Never Used  Substance Use Topics  . Alcohol use: No  . Drug use: No     Review of Systems  Constitutional: No fever/chills Eyes: No visual changes. No discharge. ENT: Positive for congestion and rhinorrhea. Cardiovascular: No chest pain. Respiratory: Positive for cough. No SOB. Gastrointestinal: No abdominal pain.  No  nausea, no vomiting.  No diarrhea.  No constipation. Musculoskeletal: Negative for musculoskeletal pain. Skin: Negative for rash, abrasions, lacerations, ecchymosis. Neurological: Negative for headaches.   ____________________________________________   PHYSICAL EXAM:  VITAL SIGNS: ED Triage Vitals  Enc Vitals Group     BP 05/27/18 1137 (!) 149/86     Pulse Rate 05/27/18 1137 86     Resp 05/27/18 1137 18     Temp 05/27/18 1137 98.6 F (37 C)     Temp Source 05/27/18 1137 Oral     SpO2 05/27/18 1137 99 %     Weight 05/27/18 1138 193 lb (87.5 kg)     Height 05/27/18 1138 5\' 2"  (1.575 m)     Head Circumference --      Peak Flow --      Pain Score 05/27/18 1137 8     Pain Loc --      Pain Edu? --      Excl. in GC? --      Constitutional: Alert and oriented. Well appearing and in no acute distress. Eyes: Conjunctivae are normal. PERRL. EOMI. No discharge. Head: Atraumatic. ENT: No frontal and maxillary sinus tenderness.      Ears: Tympanic membranes pearly gray with good landmarks. No discharge.      Nose: Mild congestion/rhinnorhea.      Mouth/Throat: Mucous membranes are moist. Oropharynx non-erythematous. Tonsils not enlarged. No exudates. Uvula midline. Neck: No stridor.   Hematological/Lymphatic/Immunilogical: No cervical lymphadenopathy. Cardiovascular: Normal rate, regular rhythm.  Good peripheral circulation. Respiratory: Normal respiratory effort without tachypnea or retractions. Lungs CTAB. Good air entry to the bases with no decreased or absent breath sounds. Gastrointestinal: Bowel sounds 4 quadrants. Soft and nontender to palpation. No guarding or rigidity. No palpable masses. No distention. Musculoskeletal: Full range of motion to all extremities. No gross deformities appreciated. Neurologic:  Normal speech and language. No gross focal neurologic deficits are appreciated.  Skin:  Skin is warm, dry and intact. No rash noted. Psychiatric: Mood and affect are  normal. Speech and behavior are normal. Patient exhibits appropriate insight and judgement.   ____________________________________________   LABS (all labs ordered are listed, but only abnormal results are displayed)  Labs Reviewed  GROUP A STREP BY PCR   ____________________________________________  EKG   ____________________________________________  RADIOLOGY Lexine Baton, personally viewed and evaluated these images (plain radiographs) as part of my medical decision making, as well as reviewing the written report by the radiologist.  Dg Chest 2 View  Result Date: 05/27/2018 CLINICAL DATA:  Sore throat and nasal congestion. Shortness of breath. EXAM: CHEST - 2 VIEW COMPARISON:  February 01, 2018 FINDINGS: The heart size and mediastinal contours are within normal limits. Both lungs are clear. The visualized skeletal structures are unremarkable. IMPRESSION: No active cardiopulmonary disease.  Electronically Signed   By: Gerome Sam III M.D   On: 05/27/2018 13:19    ____________________________________________    PROCEDURES  Procedure(s) performed:    Procedures    Medications - No data to display   ____________________________________________   INITIAL IMPRESSION / ASSESSMENT AND PLAN / ED COURSE  Pertinent labs & imaging results that were available during my care of the patient were reviewed by me and considered in my medical decision making (see chart for details).  Review of the [place] CSRS was performed in accordance of the [facility] prior to dispensing any controlled drugs.    Patient's diagnosis is consistent with URI.  Vital signs and exam are reassuring. Patient appears well and is staying well hydrated. Patient should alternate tylenol and ibuprofen for fever. Patient feels comfortable going home. Patient will be discharged home with prescriptions for flonase, tessalon perles, azithromycin. Patient is to follow up with PCP as needed or otherwise directed.  Patient is given ED precautions to return to the ED for any worsening or new symptoms.     ____________________________________________  FINAL CLINICAL IMPRESSION(S) / ED DIAGNOSES  Final diagnoses:  Upper respiratory tract infection, unspecified type      NEW MEDICATIONS STARTED DURING THIS VISIT:  ED Discharge Orders         Ordered    fluticasone (FLONASE) 50 MCG/ACT nasal spray  Daily     05/27/18 1349    benzonatate (TESSALON PERLES) 100 MG capsule  3 times daily PRN     05/27/18 1349    azithromycin (ZITHROMAX Z-PAK) 250 MG tablet     05/27/18 1349              This chart was dictated using voice recognition software/Dragon. Despite best efforts to proofread, errors can occur which can change the meaning. Any change was purely unintentional.    Enid Derry, PA-C 05/27/18 1523    Minna Antis, MD 05/27/18 1525

## 2018-07-09 ENCOUNTER — Telehealth: Payer: Self-pay | Admitting: Physician Assistant

## 2018-07-09 DIAGNOSIS — J069 Acute upper respiratory infection, unspecified: Secondary | ICD-10-CM

## 2018-07-09 MED ORDER — IPRATROPIUM BROMIDE 0.06 % NA SOLN: 2.0000 | Freq: Four times a day (QID) | NASAL | 0 refills | Status: DC

## 2018-07-09 NOTE — Progress Notes (Signed)
We are sorry you are not feeling well.  Here is how we plan to help!  Based on what you have shared with me, it looks like you may have a viral upper respiratory infection.  Upper respiratory infections are caused by a large number of viruses; however, rhinovirus is the most common cause.   Symptoms vary from person to person, with common symptoms including sore throat, cough, and fatigue or lack of energy.  A low-grade fever of up to 100.4 may present, but is often uncommon.  Symptoms vary however, and are closely related to a person's age or underlying illnesses.  The most common symptoms associated with an upper respiratory infection are nasal discharge or congestion, cough, sneezing, headache and pressure in the ears and face.  These symptoms usually persist for about 3 to 10 days, but can last up to 2 weeks.  It is important to know that upper respiratory infections do not cause serious illness or complications in most cases.    Upper respiratory infections can be transmitted from person to person, with the most common method of transmission being a person's hands.  The virus is able to live on the skin and can infect other persons for up to 2 hours after direct contact.  Also, these can be transmitted when someone coughs or sneezes; thus, it is important to cover the mouth to reduce this risk.  To keep the spread of the illness at bay, good hand hygiene is very important.  This is an infection that is most likely caused by a virus. There are no specific treatments other than to help you with the symptoms until the infection runs its course.  We are sorry you are not feeling well.  Here is how we plan to help!   For nasal congestion, you may use an oral decongestants such as Mucinex D or if you have glaucoma or high blood pressure use plain Mucinex.  Saline nasal spray or nasal drops can help and can safely be used as often as needed for congestion.  For your congestion, I have prescribed Ipratropium  Bromide nasal spray 0.03% two sprays in each nostril 2-3 times a day  If you do not have a history of heart disease, hypertension, diabetes or thyroid disease, prostate/bladder issues or glaucoma, you may also use Sudafed to treat nasal congestion.  It is highly recommended that you consult with a pharmacist or your primary care physician to ensure this medication is safe for you to take.     If you have a cough, you may use cough suppressants such as Delsym and Robitussin.  If you have glaucoma or high blood pressure, you can also use Coricidin HBP.   If you have a sore or scratchy throat, use a saltwater gargle-  to  teaspoon of salt dissolved in a 4-ounce to 8-ounce glass of warm water.  Gargle the solution for approximately 15-30 seconds and then spit.  It is important not to swallow the solution.  You can also use throat lozenges/cough drops and Chloraseptic spray to help with throat pain or discomfort.  Warm or cold liquids can also be helpful in relieving throat pain.  For headache, pain or general discomfort, you can use Ibuprofen or Tylenol as directed.   Some authorities believe that zinc sprays or the use of Echinacea may shorten the course of your symptoms.   HOME CARE Only take medications as instructed by your medical team. Be sure to drink plenty of fluids. Water is  fine as well as fruit juices, sodas and electrolyte beverages. You may want to stay away from caffeine or alcohol. If you are nauseated, try taking small sips of liquids. How do you know if you are getting enough fluid? Your urine should be a pale yellow or almost colorless. Get rest. Taking a steamy shower or using a humidifier may help nasal congestion and ease sore throat pain. You can place a towel over your head and breathe in the steam from hot water coming from a faucet. Using a saline nasal spray works much the same way. Cough drops, hard candies and sore throat lozenges may ease your cough. Avoid close  contacts especially the very young and the elderly Cover your mouth if you cough or sneeze Always remember to wash your hands.   GET HELP RIGHT AWAY IF: You develop worsening fever. If your symptoms do not improve within 10 days You develop yellow or green discharge from your nose over 3 days. You have coughing fits You develop a severe head ache or visual changes. You develop shortness of breath, difficulty breathing or start having chest pain  Your symptoms persist after you have completed your treatment plan  MAKE SURE YOU  Understand these instructions. Will watch your condition. Will get help right away if you are not doing well or get worse.  Your e-visit answers were reviewed by a board certified advanced clinical practitioner to complete your personal care plan. Depending upon the condition, your plan could have included both over the counter or prescription medications. Please review your pharmacy choice. If there is a problem, you may call our nursing hot line at and have the prescription routed to another pharmacy. Your safety is important to [place]. If you have drug allergies check your prescription carefully.   You can use MyChart to ask questions about today's visit, request a non-urgent call back, or ask for a work or school excuse for 24 hours related to this e-Visit. If it has been greater than 24 hours you will need to follow up with your provider, or enter a new e-Visit to address those concerns. You will get an e-mail in the next two days asking about your experience.  I hope that your e-visit has been valuable and will speed your recovery. Thank you for using e-visits.      ===View-only below this line===   ----- Message -----    From: Tashina M Narducci    Sent: 07/09/2018  7:55 PM EDT      To: E-Visit Mailing List Subject: E-Visit Submission: Sinus Problems  E-Visit Submission: Sinus Problems --------------------------------  Question: Which of the following have  you been experiencing? Answer:   Congested nose            Pain around the nose and face  Question: Have these symptoms significantly worsened over the last two to three days? Answer:   No  Question: Have you had any of the following? Answer:   None of the above  Question: How long have you been having these symptoms? Answer:   2 days  Question: Do you have a fever? Answer:   No, I do not have a fever  Question: Do you smoke? Answer:   No  Question: Have you ever smoked? Answer:   I have never smoked  Question: Do you have any chronic illnesses, such as diabetes, heart disease, kidney disease, or lung disease, or any illness that would weaken your body's ability to fight infection? Answer:  No  Question: When you blow your nose, what color is the mucus? Answer:   Mostly clear  Question: Have you experienced similar problems in the past? Answer:   Yes  Question: What treatments have worked in the past?  Answer:   Antibiotic to treat the sinus infections  Question: What treatment(s) in the past have been unsuccessful? Answer:   At home medication such as sudafed  Question: Is this illness similar to previous illnesses you have had?  How is it the same?  How is it different? Answer:   Stuffy nose...sinus pain and pressure around upper nose.....  Question: Have you recently been hospitalized? Answer:   No  Question: What medications are you currently taking for these symptoms? Answer:   Pain medicine  Question: Please enter the names of any medications you are taking, or any other treatments you are trying. Answer:   Tylenol            Nose spray            Allergy meds [employer] brand            Tried alka selker as well  Question: Are you pregnant? Answer:   I am confident that I am not pregnant  Question: Are you breastfeeding? Answer:   No  Question: Please list your medication allergies that you may have ? (If 'none' , please list as 'none') Answer:    Sulfa antibiotics and flagil  Question: Please list any additional comments  Answer:   It happens every year. My allergies get crazy i get a sinus infection. Go to ER(im.scared to do and i have two small children so that would be hard) and Get diagnosed with sinuses and URI.  A total of 5-10 minutes was spent evaluating this patients questionnaire and formulating a plan of care.

## 2018-07-10 ENCOUNTER — Other Ambulatory Visit: Payer: Self-pay

## 2018-07-10 ENCOUNTER — Ambulatory Visit: Payer: Self-pay | Admitting: Physician Assistant

## 2018-07-10 VITALS — BP 122/84 | HR 99 | Temp 98.8°F | Resp 16 | Wt 212.6 lb

## 2018-07-10 DIAGNOSIS — K0401 Reversible pulpitis: Secondary | ICD-10-CM

## 2018-07-10 DIAGNOSIS — J302 Other seasonal allergic rhinitis: Secondary | ICD-10-CM

## 2018-07-10 MED ORDER — PSEUDOEPHEDRINE HCL ER 120 MG PO TB12: 120.0000 mg | ORAL_TABLET | Freq: Two times a day (BID) | ORAL | 0 refills | Status: DC

## 2018-07-10 MED ORDER — IBUPROFEN 600 MG PO TABS: 600.0000 mg | ORAL_TABLET | Freq: Four times a day (QID) | ORAL | 0 refills | Status: DC | PRN

## 2018-07-10 MED ORDER — AMOXICILLIN 875 MG PO TABS: 875.0000 mg | ORAL_TABLET | Freq: Two times a day (BID) | ORAL | 0 refills | Status: DC

## 2018-07-10 NOTE — Progress Notes (Signed)
Patient ID: Tammy Frost DOB: [birth year] AGE: 39 y.o. MRN: [id number]   PCP: Patient, No Pcp Per   Chief Complaint:  Chief Complaint  Patient presents with  . Dental Pain    x 2 days     Subjective:    HPI:  Tammy Frost is a 37 y.o. female presents for evaluation  Chief Complaint  Patient presents with  . Dental Pain    x 4 days    38 year old female presents to [facility] with three day history of right lower dental pain. Gradually worsening. Severe today. Constant. Throbbing. Aggravated with mastication. Today noticed mild edema on right side of face. No discomfort with brushing teeth or using mouthwash. No discomfort with cold liquids/foods. Patient with known poor dentition; since pregnant with last child 4 years ago. Patient states she did not take her calcium supplement, had calcium leaching when pregnant, and it ruined her teeth. Being followed by oral surgeon. Is scheduled to have multiple teeth removed first week of May 2020; was rescheduled due to Covid19 pandemic. Patient suspects she had another chip to right molar recently. Denies fever, chills, sweats, body aches, headache, gum bleeding/purulent drainage, sore throat, difficulty swallowing. Patient denies smoking history or chewing tobacco history.  Patient seen at [facility] ED on 02/28/2017 with dental pain due to multiple caries and fractures. Prescribed Amoxicillin, Tramadol, and ibuprofen. Advised to follow-up with dentist.  Patient completed E-Visit yesterday, 07/09/2018, with two day history of nasal congestion and sinus pressure/discomfort. Diagnosed with acute URI. Prescribed Ipatropium Bromide nasal spray. Patient responded requesting an antibiotic. Was informed by provider, current symptoms did not warrant an antibiotic. Patient states suspects symptoms due to seasonal allergies. Associated rhinorrhea and sneezing. Taking OTC Allegra and using Flonase nasal spray. Denies worsening symptoms since  yesterday, ear pain, tinnitus, epistaxis, cough.  A limited review of symptoms was performed, pertinent positives and negatives as mentioned in HPI.  The following portions of the patient's history were reviewed and updated as appropriate: allergies, current medications and past medical history.  Patient Active Problem List   Diagnosis Date Noted  . Appendicitis 09/04/2017  . Acute appendicitis   . Chronic cholecystitis with calculus   . Cholelithiasis 07/22/2015    Allergies  Allergen Reactions  . Flagyl [Metronidazole] Hives and Rash    Patient states her skins burn.  . Sulfa Antibiotics Hives and Rash    Patient states her skin burns.    Current Outpatient Medications on File Prior to Visit  Medication Sig Dispense Refill  . albuterol (PROVENTIL HFA;VENTOLIN HFA) 108 (90 Base) MCG/ACT inhaler Inhale 2 puffs into the lungs every 6 (six) hours as needed for wheezing or shortness of breath. (Patient not taking: Reported on 07/10/2018) 1 Inhaler 2  . azithromycin (ZITHROMAX Z-PAK) 250 MG tablet Take 2 tablets (500 mg) on  Day 1,  followed by 1 tablet (250 mg) once daily on Days 2 through 5. (Patient not taking: Reported on 07/10/2018) 6 each 0  . brompheniramine-pseudoephedrine-DM 30-2-10 MG/5ML syrup Take 5 mLs by mouth 4 (four) times daily as needed. (Patient not taking: Reported on 07/10/2018) 120 mL 0  . fluticasone (FLONASE) 50 MCG/ACT nasal spray Place 2 sprays into both nostrils daily. (Patient not taking: Reported on 07/10/2018) 16 g 0  . ipratropium (ATROVENT) 0.06 % nasal spray Place 2 sprays into both nostrils 4 (four) times daily. (Patient not taking: Reported on 07/10/2018) 15 mL 0  . oxyCODONE (OXY IR/ROXICODONE) 5 MG immediate release tablet  Take 1-2 tablets (5-10 mg total) by mouth every 4 (four) hours as needed for severe pain. (Patient not taking: Reported on 07/10/2018) 30 tablet 0  . oxyCODONE-acetaminophen (PERCOCET) 5-325 MG tablet Take 1-2 tablets by mouth every 6 (six)  hours as needed for severe pain. (Patient not taking: Reported on 07/10/2018) 30 tablet 0   No current facility-administered medications on file prior to visit.        Objective:   Vitals:   07/10/18 1504  BP: 122/84  Pulse: 99  Resp: 16  Temp: 98.8 F (37.1 C)  SpO2: 97%     Wt Readings from Last 3 Encounters:  07/10/18 212 lb 9.6 oz (96.4 kg)  05/27/18 193 lb (87.5 kg)  04/20/18 203 lb (92.1 kg)    Physical Exam:   General Appearance:  Patient sitting comfortably on examination table. Conversational. Peri JeffersonGood self-historian. In no acute distress. Afebrile.   Head:  Normocephalic, without obvious abnormality, atraumatic  Eyes:  PERRL, conjunctiva/corneas clear, EOM's intact  Ears:  Left ear canal WNL. No erythema or edema. No open wound. No visible purulent drainage. No tenderness with palpation over left tragus or with manipulation of left auricle. No visible erythema or edema of left mastoid. No tenderness with palpation over left mastoid. Right ear canal WNL. No erythema or edema. No open wound. No visible purulent drainage. No tenderness with palpation over right tragus or with manipulation of right auricle. No visible erythema or edema of right mastoid. No tenderness with palpation over right mastoid. Left TM WNL. Good light reflex. Visible landmarks. No erythema. No injection. No bulging or retraction. No visible perforation. No serous effusion. No visible purulent effusion. No tympanostomy tube. No scar tissue. Right TM WNL. Good light reflex. Visible landmarks. No erythema. No injection. No bulging or retraction. No visible perforation. No serous effusion. No visible purulent effusion. No tympanostomy tube. No scar tissue.  Nose: Nares normal. Septum midline. No visible polyps. Nasal mucosa with minimal bogginess. No edema. Scant clear rhinorrhea. No nasally sounding voice. No sinus tenderness with percussion/palpation.  Throat: Lips, mucosa, and tongue normal. Throat reveals  no erythema. No postnasal drip. No visible cobblestoning. Tonsils with no enlargement or exudate. Uvula midline with no edema or erythema.  Diffusely poor dentition. Missing portion of multiple teeth. Brown/black discoloration. Two upper front teeth, missing inferior 50% portion. Lower teeth, primarily molars, reveal discoloration and loss of mass; missing portions, broken/chipped. Right lower teeth surrounding gums reveals mild inflammation; erythematous with minimal edema. Look friable; would easily bleed. No visible abscess. No active bleeding or purulent drainage. No tenderness with palpation along mandible. No palpable submandibular lymphadenopathy. No trismus.  Neck: Supple, symmetrical, trachea midline, no adenopathy  Lungs:   Clear to auscultation bilaterally, respirations unlabored. Good aeration. No rales, rhonchi, crackles or wheezing.  Heart:  Regular rate and rhythm, S1 and S2 normal, no murmur, rub, or gallop  Extremities: Extremities normal, atraumatic, no cyanosis or edema  Pulses: 2+ and symmetric  Skin: Skin color, texture, turgor normal, no rashes or lesions  Lymph nodes: Cervical, supraclavicular, and axillary nodes normal  Neurologic: Normal    Assessment & Plan:    Exam findings, diagnosis etiology and medication use and indications reviewed with patient. Follow-Up and discharge instructions provided. No emergent/urgent issues found on exam.  Patient education was provided.   Patient verbalized understanding of information provided and agrees with plan of care (POC), all questions answered. The patient is advised to call or return to clinic if condition  does not see an improvement in symptoms, or to seek the care of the closest emergency department if condition worsens with the below plan.    1. Tooth pulpitis - amoxicillin (AMOXIL) 875 MG tablet; Take 1 tablet (875 mg total) by mouth 2 (two) times daily.  Dispense: 20 tablet; Refill: 0 - ibuprofen (ADVIL) 600 MG tablet;  Take 1 tablet (600 mg total) by mouth every 6 (six) hours as needed.  Dispense: 30 tablet; Refill: 0  2. Seasonal allergic rhinitis, unspecified trigger - pseudoephedrine (SUDAFED 12 HOUR) 120 MG 12 hr tablet; Take 1 tablet (120 mg total) by mouth 2 (two) times daily.  Dispense: 30 tablet; Refill: 0  Patient with three day history of right lower molar dental pain. Very poor dentition. Patient with planned f/u with dentist. Concern for pulpitis with possible secondary infection; whether dental (no visible dental abscess) or gingivitis/periodontal disease. VSS, afebrile, in no acute distress. No indication of apical abscess, necrotizing ulcerative gingivitis, osteomyelitis, or other medical condition requiring urgent evaluation. Advised continuation of brushing teeth and using mouth wash. Prescribed Amoxicillin 875mg  bid x 10 days and ibuprofen.   Patient with three day history of URI/seasonal allergy symptoms. Advised continuation of antihistamine (Allegra) and Flonase. Prescribed Sudafed to help with congestion/sinus pressure.  Advised patient follow-up with dentist as planned. Advised patient f/u with ED, urgent care, or [facility] in 2-3 days if not improving, sooner with any worsening symptoms. Patient agreed with plan.   Tammy Frost, MHS, PA-C Rulon Sera, MHS, PA-C Advanced Practice Provider [facility]  [id number] [place], [place] [address] (p): [phone] [email or web address]

## 2018-07-10 NOTE — Patient Instructions (Addendum)
Thank you for choosing [facility] for your health care needs.  You have been diagnosed with pulpitis; multiple broken/chipped teeth, now with gum irritation/swelling.  Meds ordered this encounter  Medications  . amoxicillin (AMOXIL) 875 MG tablet    Sig: Take 1 tablet (875 mg total) by mouth 2 (two) times daily.    Dispense:  20 tablet    Refill:  0    Order Specific Question:   Supervising Provider    Answer:   MILLER, BRIAN [[id number]]  . pseudoephedrine (SUDAFED 12 HOUR) 120 MG 12 hr tablet    Sig: Take 1 tablet (120 mg total) by mouth 2 (two) times daily.    Dispense:  30 tablet    Refill:  0    Order Specific Question:   Supervising Provider    Answer:   MILLER, BRIAN [[id number]]  . ibuprofen (ADVIL) 600 MG tablet    Sig: Take 1 tablet (600 mg total) by mouth every 6 (six) hours as needed.    Dispense:  30 tablet    Refill:  0    Order Specific Question:   Supervising Provider    Answer:   Hyacinth Meeker, BRIAN [[id number]]   Continue to brush teeth, two to three times a day. Use an oral mouth rinse such as Listerine, also 2-3 times a day.  Take ibuprofen 600mg  every six hours AND Tylenol 500mg  every six hours. Take with food to prevent stomach upset. May use over the counter Pepcid (famotidine) if you do develop stomach irritation.  May apply warm compress to cheek. May use ice, if that provides more comfort.  May continue to use Oragel for pain relief.  For allergy symptoms; Continue to use Allegra daily. Continue to use Flonase nasal spray. Prescribed Sudafed; will help with congestion.  Follow-up with dentist ASAP for further management of dental issues. Follow-up with ED, urgent care, or [facility] if symptoms not improving in 2-3 days. Sooner with fever, facial swelling, gum bleeding or pus, or other new/concerning symptom.  Hope you feel better soon!    Dental Abscess  A dental abscess is an area of pus in or around a tooth. It comes from an infection. It can cause pain and other  symptoms. Treatment will help with symptoms and prevent the infection from spreading. Follow these instructions at home: Medicines  Take over-the-counter and prescription medicines only as told by your dentist.  If you were prescribed an antibiotic medicine, take it as told by your dentist. Do not stop taking it even if you start to feel better.  If you were prescribed a gel that has numbing medicine in it, use it exactly as told.  Do not drive or use heavy machinery (like a Surveyor, mining) while taking prescription pain medicine. General instructions  Rinse out your mouth often with salt water. ? To make salt water, dissolve -1 tsp of salt in 1 cup of warm water.  Eat a soft diet while your mouth is healing.  Drink enough fluid to keep your urine pale yellow.  Do not apply heat to the outside of your mouth.  Do not use any products that contain nicotine or tobacco. These include cigarettes and e-cigarettes. If you need help quitting, ask your doctor.  Keep all follow-up visits as told by your dentist. This is important. Prevent an abscess  Brush your teeth every morning and every night. Use fluoride toothpaste.  Floss your teeth each day.  Get dental cleanings as often as told by your dentist.  Think about getting dental sealant put on teeth that have deep holes (decay).  Drink water that has fluoride in it. ? Most tap water has fluoride. ? Check the label on bottled water to see if it has fluoride in it.  Drink water instead of sugary drinks.  Eat healthy meals and snacks.  Wear a mouth guard or face shield when you play sports. Contact a doctor if:  Your pain is worse, and medicine does not help. Get help right away if:  You have a fever or chills.  Your symptoms suddenly get worse.  You have a very bad headache.  You have problems breathing or swallowing.  You have trouble opening your mouth.  You have swelling in your neck or close to your eye. Summary   A dental abscess is an area of pus in or around a tooth. It is caused by an infection.  Treatment will help with symptoms and prevent the infection from spreading.  Take over-the-counter and prescription medicines only as told by your dentist.  To prevent an abscess, take good care of your teeth. Brush your teeth every morning and night. Use floss every day.  Get dental cleanings as often as told by your dentist. This information is not intended to replace advice given to you by your health care provider. Make sure you discuss any questions you have with your health care provider. Document Released: 07/16/2014 Document Revised: 11/01/2016 Document Reviewed: 11/01/2016 Elsevier Interactive Patient Education  2019 [employer]

## 2018-07-12 ENCOUNTER — Telehealth: Payer: Self-pay | Admitting: Emergency Medicine

## 2018-07-12 NOTE — Telephone Encounter (Signed)
Called to follow up with patient after her recent visit to [facility]. No answer. Left message to cal with any questions or concerns.

## 2018-09-19 ENCOUNTER — Ambulatory Visit (INDEPENDENT_AMBULATORY_CARE_PROVIDER_SITE_OTHER)
Admission: RE | Admit: 2018-09-19 | Discharge: 2018-09-19 | Disposition: A | Discharge status: Home / Self Care | Payer: Self-pay | Source: Ambulatory Visit

## 2018-09-19 DIAGNOSIS — K047 Periapical abscess without sinus: Secondary | ICD-10-CM

## 2018-09-19 MED ORDER — PENICILLIN V POTASSIUM 500 MG PO TABS: 500.0000 mg | ORAL_TABLET | Freq: Four times a day (QID) | ORAL | 0 refills | Status: AC

## 2018-09-19 MED ORDER — NAPROXEN 500 MG PO TABS: 500.0000 mg | ORAL_TABLET | Freq: Two times a day (BID) | ORAL | 0 refills | Status: DC

## 2018-09-19 MED ORDER — CHLORHEXIDINE GLUCONATE 0.12 % MT SOLN: 15.0000 mL | Freq: Two times a day (BID) | OROMUCOSAL | 0 refills | Status: DC

## 2018-09-19 NOTE — ED Provider Notes (Signed)
Virtual Visit via Video Note:  Tammy Frost  initiated request for Telemedicine visit with [facility] team. I connected with Tammy Frost  on 09/19/2018 at 11:21 AM  for a synchronized telemedicine visit using a video enabled HIPPA compliant telemedicine application. I verified that I am speaking with Tammy Frost  using two identifiers. Tammy July, NP  was physically located in a [facility] Urgent care site and Fayrene M Stout was located at a different location.   The limitations of evaluation and management by telemedicine as well as the availability of in-person appointments were discussed. Patient was informed that she  may incur a bill ( including co-pay) for this virtual visit encounter. Tammy Frost  expressed understanding and gave verbal consent to proceed with virtual visit.     History of Present Illness:Tammy Frost  is a 38 y.o. female presents with 1 present and worsening over the past week.  She is having pain in the right lower dental area and mild swelling.  This all originated from a chipped tooth.  She was scheduled to see the dentist about a week and a half ago but they canceled and rescheduled her due to an emergency.  She has been taking Tylenol for the pain which slightly helps.  Denies any trouble opening the mouth or drainage from the mouth.  Denies any fevers.  Past Medical History:  Diagnosis Date  . Anxiety   . Gallstones   . UTI (urinary tract infection) 07-20-15    Allergies  Allergen Reactions  . Flagyl [Metronidazole] Hives and Rash    Patient states her skins burn.  . Sulfa Antibiotics Hives and Rash    Patient states her skin burns.        Observations/Objective:GENERAL APPEARANCE: Well developed, well nourished, alert and cooperative, and appears to be in no acute distress. HEAD: normocephalic. Non labored breathing, no dyspnea or distress Skin: Skin normal color  PSYCHIATRIC: The mental examination revealed the patient was oriented to  person, place, and time. The patient was able to demonstrate good judgement and reason, without hallucinations, abnormal affect or abnormal behaviors during the examination. Patient is not suicidal     Assessment and Plan: Treating for dental infection with penicillin and given naproxen for pain and inflammation and swelling.  Also prescribe some Peridex mouthwash. Patient plans to follow-up with dentist next week   Follow Up Instructions:Follow up as needed for continued or worsening symptoms     I discussed the assessment and treatment plan with the patient. The patient was provided an opportunity to ask questions and all were answered. The patient agreed with the plan and demonstrated an understanding of the instructions.   The patient was advised to call back or seek an in-person evaluation if the symptoms worsen or if the condition fails to improve as anticipated.     Tammy July, NP  09/19/2018 11:21 AM         Tammy July, NP 09/19/18 1122

## 2018-09-19 NOTE — Discharge Instructions (Signed)
Treating you for a dental infection Follow up with your dentist as planned.  

## 2018-10-08 ENCOUNTER — Ambulatory Visit (INDEPENDENT_AMBULATORY_CARE_PROVIDER_SITE_OTHER)
Admission: RE | Admit: 2018-10-08 | Discharge: 2018-10-08 | Disposition: A | Discharge status: Home / Self Care | Payer: Self-pay | Source: Ambulatory Visit

## 2018-10-08 DIAGNOSIS — J3489 Other specified disorders of nose and nasal sinuses: Secondary | ICD-10-CM

## 2018-10-08 MED ORDER — FLUTICASONE PROPIONATE 50 MCG/ACT NA SUSP: 1.0000 | Freq: Every day | NASAL | 2 refills | Status: DC

## 2018-10-08 MED ORDER — AMOXICILLIN-POT CLAVULANATE 875-125 MG PO TABS: 1.0000 | ORAL_TABLET | Freq: Two times a day (BID) | ORAL | 0 refills | Status: AC

## 2018-10-08 NOTE — ED Provider Notes (Signed)
Virtual Visit via Video Note:  Tammy Frost  initiated request for Telemedicine visit with [facility] team. I connected with Tammy Frost  on 10/08/2018 at 3:57 PM  for a synchronized telemedicine visit using a video enabled HIPPA compliant telemedicine application. I verified that I am speaking with Tammy Frost  using two identifiers. Zigmund Gottron, NP  was physically located in a [facility] Urgent care site and Nita M Tamm was located at a different location.   The limitations of evaluation and management by telemedicine as well as the availability of in-person appointments were discussed. Patient was informed that she  may incur a bill ( including co-pay) for this virtual visit encounter. Tammy Frost  expressed understanding and gave verbal consent to proceed with virtual visit.     History of Present Illness:Tammy Frost  is a 38 y.o. female presents with complaints of sinus pressure. Zyrtec, tylenol and advil sinus. Worsening. Pain under eyes. Started two days ago. Some nasal drainage. No cough no sore throat. No ear pain. No fevers. Her son has had a cold but has been improving. Has had sinus infections in the past, last was a year ago. No gi symptoms. Hx of anxiety, gallstones, uti.   Past Medical History:  Diagnosis Date  . Anxiety   . Gallstones   . UTI (urinary tract infection) 07-20-15    Allergies  Allergen Reactions  . Flagyl [Metronidazole] Hives and Rash    Patient states her skins burn.  . Sulfa Antibiotics Hives and Rash    Patient states her skin burns.        Observations/Objective: Alert, oriented, non toxic in appearance. Clear coherent speech without difficulty. No increased work of breathing visualized.    Assessment and Plan: Viral vs allergic likely, no fevers, 2 days of symptoms. Supportive cares recommended. Antibiotics may be started next week if symptoms worsen or persist. Encouraged follow up in person as needed. Patient verbalized  understanding and agreeable to plan.    Follow Up Instructions:    I discussed the assessment and treatment plan with the patient. The patient was provided an opportunity to ask questions and all were answered. The patient agreed with the plan and demonstrated an understanding of the instructions.   The patient was advised to call back or seek an in-person evaluation if the symptoms worsen or if the condition fails to improve as anticipated.  I provided 13 minutes of non-face-to-face time during this encounter.    Zigmund Gottron, NP  10/08/2018 3:57 PM         Zigmund Gottron, NP 10/08/18 [phone]

## 2018-10-08 NOTE — Discharge Instructions (Addendum)
Push fluids to ensure adequate hydration and keep secretions thin.  Tylenol and/or ibuprofen as needed for pain.  Daily fluticasone or nasonex to help relieve some of your facial pressure.  A decongestant or expectorant- such as mucinex even, may help loosen secretions to help with drainage.  If worsening or no improvement by Friday may complete course of antibiotics.  If your symptoms have improved at all I would not recommend these.  If symptoms worsen or do not improve in the next week to return to be seen or to follow up with your PCP.

## 2019-01-30 ENCOUNTER — Ambulatory Visit (INDEPENDENT_AMBULATORY_CARE_PROVIDER_SITE_OTHER)
Admission: RE | Admit: 2019-01-30 | Discharge: 2019-01-30 | Disposition: A | Discharge status: Home / Self Care | Payer: Self-pay | Source: Ambulatory Visit

## 2019-01-30 ENCOUNTER — Other Ambulatory Visit: Payer: Self-pay

## 2019-01-30 DIAGNOSIS — K0889 Other specified disorders of teeth and supporting structures: Secondary | ICD-10-CM

## 2019-01-30 MED ORDER — AMOXICILLIN-POT CLAVULANATE 875-125 MG PO TABS: 1.0000 | ORAL_TABLET | Freq: Two times a day (BID) | ORAL | 0 refills | Status: DC

## 2019-01-30 NOTE — ED Provider Notes (Signed)
Virtual Visit via Video Note:  [name]  initiated request for Telemedicine visit with [facility]gent [facility] team. I connected with Shakeeta M Bodey  on 01/30/2019 at 9:25 AM  for a synchronized telemedicine visit using a video enabled HIPPA compliant telemedicine application. I verified that I am speaking with Lakshmi M Buczek  using two identifiers. Ok Edwards, PA-C  was physically located in a [facility] Urgent care site and Aron M Klassen was located at a different location.   The limitations of evaluation and management by telemedicine as well as the availability of in-person appointments were discussed. Patient was informed that she  may incur a bill ( including co-pay) for this virtual visit encounter. Lyana M Antonucci  expressed understanding and gave verbal consent to proceed with virtual visit.     History of Present Illness:Tammy Frost  is a 38 y.o. female presents with 2-3 day history of chipped tooth with increase pain starting last night. Patient was eating something hard when she chipped her right bottom tooth. States had intermittent pain that was resolved by tylenol and mouthwash. However last night had increased pain and now with swelling of the gums around the tooth. She denies fever, chills, body aches. Denies facial swelling.  Denies trouble swallowing, trouble breathing, tripoding, drooling, trismus.  Originally had a dentist appointment 02/07/2019.  However, dentist call to reschedule to 02/28/2019, and therefore set up this appointment for evaluation.  Past Medical History:  Diagnosis Date  . Anxiety   . Gallstones   . UTI (urinary tract infection) 07-20-15    Allergies  Allergen Reactions  . Flagyl [Metronidazole] Hives and Rash    Patient states her skins burn.  . Sulfa Antibiotics Hives and Rash    Patient states her skin burns.        Observations/Objective: General: Well appearing, nontoxic, no acute distress. Sitting comfortably. Head: Normocephalic,  atraumatic Eye: No conjunctival injection, eyelid swelling. EOMI ENT: Mucus membranes moist, no lip cracking. No obvious nasal drainage. Unable to visualize tooth given video visit. Patient states with swelling of the gums to the right bottom jaw. No tripoding, drooling, trismus. Patient self palpation of floor of mouth is soft. No facial swelling visualized.  Pulm: Speaking in full sentences without difficulty. Normal effort. No respiratory distress, accessory muscle use. Neuro: Normal mental status. Alert and oriented x 3.   Assessment and Plan: Start antibiotics for possible dental infection. Symptomatic treatment as needed. Discussed with patient symptoms can return if dental problem is not addressed. Follow up with dentist for further evaluation and treatment of dental pain. Resources given. Return precautions given.   Follow Up Instructions:    I discussed the assessment and treatment plan with the patient. The patient was provided an opportunity to ask questions and all were answered. The patient agreed with the plan and demonstrated an understanding of the instructions.   The patient was advised to call back or seek an in-person evaluation if the symptoms worsen or if the condition fails to improve as anticipated.  I provided 15 minutes of non-face-to-face time during this encounter.    Ok Edwards, PA-C  01/30/2019 9:25 AM        Ok Edwards, PA-C 01/30/19 [phone]

## 2019-01-30 NOTE — Discharge Instructions (Signed)
Start Augmentin as directed for dental infection. Ibuprofen 800mg  three times a day or Naproxen 440mg  twice a day to help with pain and inflammation. Supplement with tylenol and oragel as needed. Follow up with dentist for further treatment and evaluation. If experiencing swelling of the throat, trouble breathing, trouble swallowing, leaning forward to breath, drooling, go to the emergency department for further evaluation.

## 2019-02-14 ENCOUNTER — Other Ambulatory Visit: Payer: Self-pay

## 2019-02-14 ENCOUNTER — Inpatient Hospital Stay: Admission: RE | Admit: 2019-02-14 | Payer: Self-pay | Source: Ambulatory Visit

## 2019-02-14 ENCOUNTER — Emergency Department: Payer: Self-pay

## 2019-02-14 ENCOUNTER — Emergency Department
Admission: EM | Admit: 2019-02-14 | Discharge: 2019-02-14 | Disposition: A | Discharge status: Home / Self Care | Payer: Self-pay | Attending: Emergency Medicine | Admitting: Emergency Medicine

## 2019-02-14 ENCOUNTER — Telehealth: Payer: Self-pay

## 2019-02-14 DIAGNOSIS — L03211 Cellulitis of face: Secondary | ICD-10-CM | POA: Insufficient documentation

## 2019-02-14 LAB — COMPREHENSIVE METABOLIC PANEL
ALT: 21 U/L (ref 0–44)
AST: 18 U/L (ref 15–41)
Albumin: 3.8 g/dL (ref 3.5–5.0)
Alkaline Phosphatase: 54 U/L (ref 38–126)
Anion gap: 10 (ref 5–15)
BUN: 6 mg/dL (ref 6–20)
CO2: 22 mmol/L (ref 22–32)
Calcium: 8.7 mg/dL — ABNORMAL LOW (ref 8.9–10.3)
Chloride: 102 mmol/L (ref 98–111)
Creatinine, Ser: 0.89 mg/dL (ref 0.44–1.00)
GFR calc Af Amer: >60 mL/min (ref >60)
GFR calc non Af Amer: >60 mL/min (ref >60)
Glucose, Bld: 111 mg/dL — ABNORMAL HIGH (ref 70–99)
Potassium: 3.3 mmol/L — ABNORMAL LOW (ref 3.5–5.1)
Sodium: 134 mmol/L — ABNORMAL LOW (ref 135–145)
Total Bilirubin: 0.9 mg/dL (ref 0.3–1.2)
Total Protein: 7.8 g/dL (ref 6.5–8.1)

## 2019-02-14 LAB — CBC WITH DIFFERENTIAL/PLATELET
Abs Immature Granulocytes: 0.03 10*3/uL (ref 0.00–0.07)
Basophils Absolute: 0 10*3/uL (ref 0.0–0.1)
Basophils Relative: 0 %
Eosinophils Absolute: 0.2 10*3/uL (ref 0.0–0.5)
Eosinophils Relative: 3 %
HCT: 36.5 % (ref 36.0–46.0)
Hemoglobin: 11.8 g/dL — ABNORMAL LOW (ref 12.0–15.0)
Immature Granulocytes: 1 %
Lymphocytes Relative: 10 %
Lymphs Abs: 0.6 10*3/uL — ABNORMAL LOW (ref 0.7–4.0)
MCH: 26.8 pg (ref 26.0–34.0)
MCHC: 32.3 g/dL (ref 30.0–36.0)
MCV: 83 fL (ref 80.0–100.0)
Monocytes Absolute: 0.4 10*3/uL (ref 0.1–1.0)
Monocytes Relative: 7 %
Neutro Abs: 4.7 10*3/uL (ref 1.7–7.7)
Neutrophils Relative %: 79 %
Platelets: 168 10*3/uL (ref 150–400)
RBC: 4.4 MIL/uL (ref 3.87–5.11)
RDW: 12.8 % (ref 11.5–15.5)
WBC: 5.9 10*3/uL (ref 4.0–10.5)
nRBC: 0 % (ref 0.0–0.2)

## 2019-02-14 MED ORDER — ACETAMINOPHEN 325 MG PO TABS: 650.0000 mg | ORAL_TABLET | Freq: Once | ORAL | Status: DC

## 2019-02-14 MED ORDER — ACETAMINOPHEN 500 MG PO TABS
1000.0000 mg | ORAL_TABLET | Freq: Once | ORAL | Status: AC
  Administered 2019-02-14: 1000 mg via ORAL

## 2019-02-14 MED ORDER — CLINDAMYCIN HCL 300 MG PO CAPS: 300.0000 mg | ORAL_CAPSULE | Freq: Three times a day (TID) | ORAL | 0 refills | Status: AC

## 2019-02-14 MED ORDER — IOHEXOL 300 MG/ML  SOLN
75.0000 mL | Freq: Once | INTRAMUSCULAR | Status: AC | PRN
  Administered 2019-02-14: 20:00:00 75 mL via INTRAVENOUS
  Filled 2019-02-14: qty 75

## 2019-02-14 MED ORDER — CLINDAMYCIN HCL 300 MG PO CAPS: 300.0000 mg | ORAL_CAPSULE | Freq: Three times a day (TID) | ORAL | 0 refills | Status: DC

## 2019-02-14 MED ORDER — ACETAMINOPHEN 500 MG PO TABS
ORAL_TABLET | ORAL | Status: AC
  Filled 2019-02-14: qty 2

## 2019-02-14 MED ORDER — CLINDAMYCIN PHOSPHATE 600 MG/50ML IV SOLN
600.0000 mg | Freq: Once | INTRAVENOUS | Status: AC
  Administered 2019-02-14: 600 mg via INTRAVENOUS
  Filled 2019-02-14: qty 50

## 2019-02-14 NOTE — Discharge Instructions (Signed)
OPTIONS FOR DENTAL FOLLOW UP CARE ° °[place] Department of Health and Human Services - Local Safety Net Dental Clinics °http://www.ncdhhs.gov/dph/oralhealth/services/safetynetclinics.htm °  °[facility] (336-562-3123) ° °[facility] [place] (919-933-9087) ° °[facility] (919-663-1744 ext [phone]) ° °[facility] ([phone]) ° °[facility] ([phone]) °This clinic caters to the indigent population and is on a lottery system. °Location: °[facility], [address], [place] °Clinic Hours: °Wednesdays from 6pm - 9pm, patients seen by a lottery system. °For dates, call or go to [email or web address].[facility].edu/[facility]/patients/Dental-[facility] °Services: °Cleanings, fillings and simple extractions. °Payment Options: °DENTAL WORK IS FREE OF CHARGE. Bring proof of income or support. °Best way to get seen: °Arrive at 5:15 pm - this is a lottery, NOT first come/first serve, so arriving earlier will not increase your chances of being seen. °  °  °[facility] °919-537-3737 °Select option 1 for emergencies °  °Location: °[facility], [address], [place] °Clinic Hours: °No walk-ins accepted - call the day before to schedule an appointment. °Check in times are 9:30 am and 1:30 pm. °Services: °Simple extractions, temporary fillings, pulpectomy/pulp debridement, uncomplicated abscess drainage. °Payment Options: °PAYMENT IS DUE AT THE TIME OF SERVICE.  Fee is usually $100-200, additional surgical procedures (e.g. abscess drainage) may be extra. °Cash, checks, Visa/MasterCard accepted.  Can file Medicaid if patient is covered for dental - patient should call case worker to check. °No discount for [facility] patients. °Best way to get seen: °MUST call the day before and get onto the schedule. Can usually be seen the next 1-2 days. No walk-ins accepted. °  °  °[facility] °[phone] °   °Location: °[facility], [address], [place] °Clinic Hours: °M, W, Th, F 8am or 1:30pm, Tues 9a or 1:30 - first come/first served. °Services: °Simple extractions, temporary fillings, uncomplicated abscess drainage.  You do not need to be an [place] resident. °Payment Options: °PAYMENT IS DUE AT THE TIME OF SERVICE. °Dental insurance, otherwise sliding scale - bring proof of income or support. °Depending on income and treatment needed, cost is usually $50-200. °Best way to get seen: °Arrive early as it is first come/first served. °  °  °[facility] °[phone] °  °Location: °[address] °Clinic Hours: °Mon-Thu 8a-5p °Services: °Most basic dental services including extractions and fillings. °Payment Options: °PAYMENT IS DUE AT THE TIME OF SERVICE. °Sliding scale, up to 50% off - bring proof if income or support. °Medicaid with dental option accepted. °Best way to get seen: °Call to schedule an appointment, can usually be seen within 2 weeks OR they will try to see walk-ins - show up at 8a or 2p (you may have to wait). °  °  °[facility] °919-245-2435 °[place] RESIDENTS ONLY °  °Location: °[facility], [address], [place], [place] [address] °Clinic Hours: By appointment only. °Monday - Thursday 8am-5pm, Friday 8am-12pm °Services: Cleanings, fillings, extractions. °Payment Options: °PAYMENT IS DUE AT THE TIME OF SERVICE. °Cash, Visa or MasterCard. Sliding scale - $30 minimum per service. °Best way to get seen: °Come in to office, complete packet and make an appointment - need proof of income °or support monies for each household member and proof of [place] residence. °Usually takes about a month to get in. °  °  °[facility] °[phone] °  °Location: °[address]   [place] °Clinic Hours: Walk-in Urgent Care Dental Services are offered Monday-Friday  mornings only. °The numbers of emergencies accepted daily is limited to the number of °providers available. °Maximum 15 - Mondays, Wednesdays & Thursdays °Maximum 10 - Tuesdays & Fridays °Services: °You do not need to be a [place] resident to be seen for a dental emergency. °Emergencies are defined as pain, swelling, abnormal bleeding, or dental trauma. Walkins will receive x-rays if needed. °NOTE: Dental cleaning is not an emergency. °Payment Options: °PAYMENT IS DUE AT THE TIME OF SERVICE. °Minimum co-pay is $40.00 for uninsured patients. °Minimum co-pay is $3.00 for Medicaid with dental coverage. °Dental Insurance is accepted and must be presented at time of visit. °Medicare does not cover dental. °Forms of payment: Cash, credit card, checks. °Best way to get seen: °If not previously registered with the clinic, walk-in dental registration begins at 7:15 am and is on a first come/first serve basis. °If previously registered with the clinic, call to make an appointment. °  °  °[facility] °919-776-4359 °[place] RESIDENTS ONLY °  °Location: °[address], [place], [place] °Clinic Hours: °Mon-Thu 10a-2p °Services: Extractions only! °Payment Options: °FREE (donations accepted) - bring proof of income or support °Best way to get seen: °Call and schedule an appointment OR come at 8am on the 1st Monday of every month (except for holidays) when it is first come/first served. °  °  °[facility] °[phone] °  °Location: °[address], [place] °Clinic Hours: °Friday mornings °Services, Payment Options, Best way to get seen: °Call for info °

## 2019-02-14 NOTE — ED Notes (Signed)
Pt verbalizes d/c instructions. Signature pad not working at this time.

## 2019-02-14 NOTE — ED Triage Notes (Signed)
Pt comes via POV from home with c/o left sided facial swelling.  Pt states tooth ache last week. Pt states bad tooth on the bottom left side.  Pt has swelling noted to lips and left side of face.

## 2019-02-15 NOTE — ED Provider Notes (Signed)
[facility] Emergency Department Provider Note  ____________________________________________  Time seen: Approximately 12:03 AM  I have reviewed the triage vital signs and the nursing notes.   HISTORY  Chief Complaint Facial Swelling    HPI Tammy Frost is a 38 y.o. female presents to the emergency department with dental pain.  Patient localizes pain to superior 8, 9 and 10 which are affected by dental caries.  Patient also noted fever and chills earlier in the day.  She has no pain underneath the tongue or difficulty swallowing.  She has an appointment with a local dentist but it has been pushed back to later in the month.  No other alleviating measures have been attempted.        Past Medical History:  Diagnosis Date  . Anxiety   . Gallstones   . UTI (urinary tract infection) 07-20-15    Patient Active Problem List   Diagnosis Date Noted  . Appendicitis 09/04/2017  . Acute appendicitis   . Chronic cholecystitis with calculus   . Cholelithiasis 07/22/2015    Past Surgical History:  Procedure Laterality Date  . CESAREAN SECTION  12/27/2011  . CESAREAN SECTION  12/31/2013  . CHOLECYSTECTOMY N/A 09/02/2015   Procedure: LAPAROSCOPIC CHOLECYSTECTOMY WITH INTRAOPERATIVE CHOLANGIOGRAM;  Surgeon: Gladis Riffleatherine L Loflin, MD;  Location: [facility] ORS;  Service: General;  Laterality: N/A;  . LAPAROSCOPIC APPENDECTOMY N/A 09/04/2017   Procedure: APPENDECTOMY LAPAROSCOPIC;  Surgeon: Leafy RoPabon, Diego F, MD;  Location: [facility] ORS;  Service: General;  Laterality: N/A;    Prior to Admission medications   Medication Sig Start Date End Date Taking? Authorizing Provider  amoxicillin-clavulanate (AUGMENTIN) 875-125 MG tablet Take 1 tablet by mouth every 12 (twelve) hours. 01/30/19   Cathie HoopsYu, Amy V, PA-C  clindamycin (CLEOCIN) 300 MG capsule Take 1 capsule (300 mg total) by mouth 3 (three) times daily for 10 days. 02/14/19 02/24/19  Orvil FeilWoods, Dariyah Garduno M, PA-C  albuterol (PROVENTIL  HFA;VENTOLIN HFA) 108 (90 Base) MCG/ACT inhaler Inhale 2 puffs into the lungs every 6 (six) hours as needed for wheezing or shortness of breath. Patient not taking: Reported on 07/10/2018 10/03/17 09/19/18  Nita SickleVeronese, [place], MD  fluticasone [facility](FLONASE) 50 MCG/ACT nasal spray Place 1 spray into both nostrils daily. 10/08/18 01/30/19  Linus MakoBurky, Natalie B, NP  ipratropium (ATROVENT) 0.06 % nasal spray Place 2 sprays into both nostrils 4 (four) times daily. Patient not taking: Reported on 07/10/2018 07/09/18 09/19/18  Ofilia Neaslark, Michael L, PA-C    Allergies Flagyl [metronidazole] and Sulfa antibiotics  Family History  Problem Relation Age of Onset  . Cancer Maternal Grandmother        lung  . Emphysema Maternal Grandmother   . Cancer Maternal Grandfather        pancreatic  . Hypertension Mother   . Alcohol abuse Neg Hx   . Arthritis Neg Hx   . Asthma Neg Hx   . Birth defects Neg Hx   . COPD Neg Hx   . Depression Neg Hx   . Diabetes Neg Hx   . Drug abuse Neg Hx   . Early death Neg Hx   . Hearing loss Neg Hx   . Heart disease Neg Hx   . Hyperlipidemia Neg Hx   . Kidney disease Neg Hx   . Learning disabilities Neg Hx   . Mental illness Neg Hx   . Mental retardation Neg Hx   . Miscarriages / Stillbirths Neg Hx   . Vision loss Neg Hx   . Stroke Neg  Hx   . Varicose Veins Neg Hx     Social History Social History   Tobacco Use  . Smoking status: Never Smoker  . Smokeless tobacco: Never Used  Substance Use Topics  . Alcohol use: No  . Drug use: No     Review of Systems  Constitutional: Patient has fever.  Eyes: No visual changes. No discharge ENT: Patient has dental pain. Cardiovascular: no chest pain. Respiratory: no cough. No SOB. Gastrointestinal: No abdominal pain.  No nausea, no vomiting.  No diarrhea.  No constipation. Genitourinary: Negative for dysuria. No hematuria Musculoskeletal: Negative for musculoskeletal pain. Skin: Negative for rash, abrasions, lacerations,  ecchymosis. Neurological: Negative for headaches, focal weakness or numbness.   ____________________________________________   PHYSICAL EXAM:  VITAL SIGNS: ED Triage Vitals  Enc Vitals Group     BP 02/14/19 1824 126/74     Pulse Rate 02/14/19 1824 (!) 110     Resp 02/14/19 1824 18     Temp 02/14/19 1824 (!) 102.6 F (39.2 C)     Temp Source 02/14/19 1824 Oral     SpO2 02/14/19 1824 100 %     Weight 02/14/19 1822 208 lb (94.3 kg)     Height 02/14/19 1822 5\' 2"  (1.575 m)     Head Circumference --      Peak Flow --      Pain Score 02/14/19 1822 4     Pain Loc --      Pain Edu? --      Excl. in [place]? --      Constitutional: Alert and oriented. Well appearing and in no acute distress. Eyes: Conjunctivae are normal. PERRL. EOMI. Head: Atraumatic. ENT:      Nose: No congestion/rhinnorhea.      Mouth/Throat: Mucous membranes are moist.  Superior 8, 9 and 10 are affected by dental caries.  No significant facial swelling.  No pain to palpation underneath the tongue. Cardiovascular: Normal rate, regular rhythm. Normal S1 and S2.  Good peripheral circulation. Respiratory: Normal respiratory effort without tachypnea or retractions. Lungs CTAB. Good air entry to the bases with no decreased or absent breath sounds. Skin:  Skin is warm, dry and intact. No rash noted. Psychiatric: Mood and affect are normal. Speech and behavior are normal. Patient exhibits appropriate insight and judgement.   ____________________________________________   LABS (all labs ordered are listed, but only abnormal results are displayed)  Labs Reviewed  CBC WITH DIFFERENTIAL/PLATELET - Abnormal; Notable for the following components:      Result Value   Hemoglobin 11.8 (*)    Lymphs Abs 0.6 (*)    All other components within normal limits  COMPREHENSIVE METABOLIC PANEL - Abnormal; Notable for the following components:   Sodium 134 (*)    Potassium 3.3 (*)    Glucose, Bld 111 (*)    Calcium 8.7 (*)     All other components within normal limits   ____________________________________________  EKG   ____________________________________________  RADIOLOGY I personally viewed and evaluated these images as part of my medical decision making, as well as reviewing the written report by the radiologist.  Ct Maxillofacial W Contrast  Result Date: 02/14/2019 CLINICAL DATA:  Left-sided facial swelling.  Toothache. EXAM: CT MAXILLOFACIAL WITH CONTRAST TECHNIQUE: Multidetector CT imaging of the maxillofacial structures was performed with intravenous contrast. Multiplanar CT image reconstructions were also generated. CONTRAST:  56mL OMNIPAQUE IOHEXOL 300 MG/ML  SOLN COMPARISON:  None. FINDINGS: Osseous: Widespread dental and periodontal disease. Multiple caries affecting the maxillary  and mandibular dentition. Multiple periapical abscesses including teeth 2, 9, 13, 15 and 16 of the maxilla and teeth 18 and 19, 29, 30 and 31 of the mandible. Orbits: Normal Sinuses: Clear Soft tissues: Only perhaps mild swelling of the left face visible by CT. No evidence of advanced facial inflammation. No sign of abscess. Limited intracranial: Normal IMPRESSION: Extensive dental and periodontal disease as outlined above. Only minimal if any cellulitic changes of the left face. No evidence of drainable soft tissue abscess. Electronically Signed   By: Paulina Fusi M.D.   On: 02/14/2019 20:42    ____________________________________________    PROCEDURES  Procedure(s) performed:    Procedures    Medications  acetaminophen (TYLENOL) tablet 1,000 mg (1,000 mg Oral Given 02/14/19 1831)  clindamycin (CLEOCIN) IVPB 600 mg (0 mg Intravenous Stopped 02/14/19 2049)  iohexol (OMNIPAQUE) 300 MG/ML solution 75 mL (75 mLs Intravenous Contrast Given 02/14/19 2009)     ____________________________________________   INITIAL IMPRESSION / ASSESSMENT AND PLAN / ED COURSE  Pertinent labs & imaging results that were available  during my care of the patient were reviewed by me and considered in my medical decision making (see chart for details).  Review of the [place] CSRS was performed in accordance of the [facility] prior to dispensing any controlled drugs.           Assessment and plan Facial cellulitis 38 year old female presents to the emergency department with fever and dental pain along superior 8, 9 and 10.  Patient was febrile and mildly tachycardic at triage.  On physical exam, she did not have significant facial swelling.  She had no pain underneath the tongue and was managing her own secretions.  CT maxillofacial revealed no evidence of abscess and findings suggested cellulitis.  Patient was given IV clindamycin and was discharged with clindamycin.  She was advised to make an appointment with a local dentist as soon as possible.  All patient questions were answered.    ____________________________________________  FINAL CLINICAL IMPRESSION(S) / ED DIAGNOSES  Final diagnoses:  Facial cellulitis      NEW MEDICATIONS STARTED DURING THIS VISIT:  ED Discharge Orders         Ordered    clindamycin (CLEOCIN) 300 MG capsule  3 times daily,   Status:  Discontinued     02/14/19 2112    clindamycin (CLEOCIN) 300 MG capsule  3 times daily     02/14/19 2119              This chart was dictated using voice recognition software/Dragon. Despite best efforts to proofread, errors can occur which can change the meaning. Any change was purely unintentional.    Orvil Feil, PA-C 02/15/19 0010    Arnaldo Natal, MD 02/18/19 [phone]

## 2019-02-16 ENCOUNTER — Ambulatory Visit: Payer: Self-pay | Admitting: *Deleted

## 2019-02-16 ENCOUNTER — Inpatient Hospital Stay: Admission: RE | Admit: 2019-02-16 | Payer: Self-pay | Source: Ambulatory Visit

## 2019-02-16 ENCOUNTER — Ambulatory Visit (INDEPENDENT_AMBULATORY_CARE_PROVIDER_SITE_OTHER)
Admission: RE | Admit: 2019-02-16 | Discharge: 2019-02-16 | Disposition: A | Discharge status: Home / Self Care | Payer: Self-pay | Source: Ambulatory Visit

## 2019-02-16 DIAGNOSIS — K056 Periodontal disease, unspecified: Secondary | ICD-10-CM

## 2019-02-16 DIAGNOSIS — L03211 Cellulitis of face: Secondary | ICD-10-CM

## 2019-02-16 DIAGNOSIS — R112 Nausea with vomiting, unspecified: Secondary | ICD-10-CM

## 2019-02-16 DIAGNOSIS — R220 Localized swelling, mass and lump, head: Secondary | ICD-10-CM

## 2019-02-16 MED ORDER — ONDANSETRON 8 MG PO TBDP: 8.0000 mg | ORAL_TABLET | Freq: Three times a day (TID) | ORAL | 0 refills | Status: DC | PRN

## 2019-02-16 NOTE — Telephone Encounter (Signed)
Pt called stating that she had been to the ED for facial cellulitis and was prescribed Cleocin 300 mg tid. She has taking 3 doses and with the last dose she has vomited.  She stated when she took the first doses she had heartburn. She is taking the medication with food like it is recommended.  She does not have a pcp. She is advised to do an e visit to see if a another provider can prescribe an antibiotic that she can tolerate for her face.  She voiced understanding.

## 2019-02-16 NOTE — ED Provider Notes (Signed)
Virtual Visit via Video Note:  Tammy Frost  initiated request for Telemedicine visit with [facility] team. I connected with Tammy Frost  on 02/16/2019 at 4:39 PM  for a synchronized telemedicine visit using a video enabled HIPPA compliant telemedicine application. I verified that I am speaking with [name]  using two identifiers. Tammy Eagles, PA-C  was physically located in a [facility] Urgent care site and Tammy Frost was located at a different location.   The limitations of evaluation and management by telemedicine as well as the availability of in-person appointments were discussed. Patient was informed that she  may incur a bill ( including co-pay) for this virtual visit encounter. Tammy Frost  expressed understanding and gave verbal consent to proceed with virtual visit.     History of Present Illness:Tammy Frost  is a 38 y.o. female presents with nausea and vomiting from taking clindamycin as prescribed for facial cellulitis from her ER visit on 02/14/2019.  Patient has an appointment set up with a dental surgeon on 02/25/2019.  She has also previously taken Augmentin for the same kind of issue, states that she is doing better with clindamycin.  However, she is concerned because she did not know she would have these kinds of reactions of nausea and vomiting and also heartburn.  She has not tried any medications for relief.  States that she has only eaten a little bit when she takes antibiotic.  She does like how her face and mouth have improved from taking the antibiotic clindamycin.   ROS  No current facility-administered medications for this encounter.    Current Outpatient Medications  Medication Sig Dispense Refill  . amoxicillin-clavulanate (AUGMENTIN) 875-125 MG tablet Take 1 tablet by mouth every 12 (twelve) hours. 14 tablet 0  . clindamycin (CLEOCIN) 300 MG capsule Take 1 capsule (300 mg total) by mouth 3 (three) times daily for 10 days. 30 capsule 0  .  ondansetron (ZOFRAN-ODT) 8 MG disintegrating tablet Take 1 tablet (8 mg total) by mouth every 8 (eight) hours as needed for nausea or vomiting. 20 tablet 0     Allergies  Allergen Reactions  . Flagyl [Metronidazole] Hives and Rash    Patient states her skins burn.  . Sulfa Antibiotics Hives and Rash    Patient states her skin burns.     Past Medical History:  Diagnosis Date  . Anxiety   . Gallstones   . UTI (urinary tract infection) 07-20-15    Past Surgical History:  Procedure Laterality Date  . CESAREAN SECTION  12/27/2011  . CESAREAN SECTION  12/31/2013  . CHOLECYSTECTOMY N/A 09/02/2015   Procedure: LAPAROSCOPIC CHOLECYSTECTOMY WITH INTRAOPERATIVE CHOLANGIOGRAM;  Surgeon: Hubbard Robinson, MD;  Location: [facility] ORS;  Service: General;  Laterality: N/A;  . LAPAROSCOPIC APPENDECTOMY N/A 09/04/2017   Procedure: APPENDECTOMY LAPAROSCOPIC;  Surgeon: Jules Husbands, MD;  Location: [facility] ORS;  Service: General;  Laterality: N/A;      Observations/Objective: Physical Exam Constitutional:      General: She is not in acute distress.    Appearance: Normal appearance. She is well-developed. She is not ill-appearing, toxic-appearing or diaphoretic.  HENT:     Head:     Comments: Trace swelling of upper left but no erythema.  Patient endorses that this is significantly improved from her previous visit at the ER, no image available for review. Eyes:     Extraocular Movements: Extraocular movements intact.  Pulmonary:  Effort: Pulmonary effort is normal.  Neurological:     General: No focal deficit present.     Mental Status: She is alert and oriented to person, place, and time.  Psychiatric:        Mood and Affect: Mood normal.        Behavior: Behavior normal.        Thought Content: Thought content normal.        Judgment: Judgment normal.     Results for orders placed or performed during the hospital encounter of 02/14/19 (from the past 72 hour(s))  CBC with Differential      Status: Abnormal   Collection Time: 02/14/19  7:11 PM  Result Value Ref Range   WBC 5.9 4.0 - 10.5 K/uL   RBC 4.40 3.87 - 5.11 MIL/uL   Hemoglobin 11.8 (L) 12.0 - 15.0 g/dL   HCT 49.6 75.9 - 16.3 %   MCV 83.0 80.0 - 100.0 fL   MCH 26.8 26.0 - 34.0 pg   MCHC 32.3 30.0 - 36.0 g/dL   RDW 84.6 65.9 - 93.5 %   Platelets 168 150 - 400 K/uL   nRBC 0.0 0.0 - 0.2 %   Neutrophils Relative % 79 %   Neutro Abs 4.7 1.7 - 7.7 K/uL   Lymphocytes Relative 10 %   Lymphs Abs 0.6 (L) 0.7 - 4.0 K/uL   Monocytes Relative 7 %   Monocytes Absolute 0.4 0.1 - 1.0 K/uL   Eosinophils Relative 3 %   Eosinophils Absolute 0.2 0.0 - 0.5 K/uL   Basophils Relative 0 %   Basophils Absolute 0.0 0.0 - 0.1 K/uL   Immature Granulocytes 1 %   Abs Immature Granulocytes 0.03 0.00 - 0.07 K/uL    Comment: Performed at [facility], [address] [place], [place] [address]  Comprehensive metabolic panel     Status: Abnormal   Collection Time: 02/14/19  7:11 PM  Result Value Ref Range   Sodium 134 (L) 135 - 145 mmol/L   Potassium 3.3 (L) 3.5 - 5.1 mmol/L   Chloride 102 98 - 111 mmol/L   CO2 22 22 - 32 mmol/L   Glucose, Bld 111 (H) 70 - 99 mg/dL   BUN 6 6 - 20 mg/dL   Creatinine, Ser 9.39 0.44 - 1.00 mg/dL   Calcium 8.7 (L) 8.9 - 10.3 mg/dL   Total Protein 7.8 6.5 - 8.1 g/dL   Albumin 3.8 3.5 - 5.0 g/dL   AST 18 15 - 41 U/L   ALT 21 0 - 44 U/L   Alkaline Phosphatase 54 38 - 126 U/L   Total Bilirubin 0.9 0.3 - 1.2 mg/dL   GFR calc non Af Amer >60 >60 mL/min   GFR calc Af Amer >60 >60 mL/min   Anion gap 10 5 - 15    Comment: Performed at [facility] [address] [place], [place] [address]     Ct Maxillofacial W Contrast  Result Date: 02/14/2019 CLINICAL DATA:  Left-sided facial swelling.  Toothache. EXAM: CT MAXILLOFACIAL WITH CONTRAST TECHNIQUE: Multidetector CT imaging of the maxillofacial structures was performed with intravenous contrast. Multiplanar CT image reconstructions were  also generated. CONTRAST:  67mL OMNIPAQUE IOHEXOL 300 MG/ML  SOLN COMPARISON:  None. FINDINGS: Osseous: Widespread dental and periodontal disease. Multiple caries affecting the maxillary and mandibular dentition. Multiple periapical abscesses including teeth 2, 9, 13, 15 and 16 of the maxilla and teeth 18 and 19, 29, 30 and 31 of the mandible. Orbits:  Normal Sinuses: Clear Soft tissues: Only perhaps mild swelling of the left face visible by CT. No evidence of advanced facial inflammation. No sign of abscess. Limited intracranial: Normal IMPRESSION: Extensive dental and periodontal disease as outlined above. Only minimal if any cellulitic changes of the left face. No evidence of drainable soft tissue abscess. Electronically Signed   By: Paulina FusiMark  Shogry M.D.   On: 02/14/2019 20:42    Assessment and Plan:  1. Nausea and vomiting, intractability of vomiting not specified, unspecified vomiting type   2. Periodontal disease   3. Facial swelling   4. Facial cellulitis    Patient prefers to stay on clindamycin instead of switching to Augmentin.  Counseled patient to take clindamycin with a meal, start a probiotic.  Offered patient Zofran to see if it helps with nausea and vomiting.  Patient has strict ER and return to clinic precautions.  Otherwise emphasized need to maintain follow-up with her dental surgeon.   Follow Up Instructions:    I discussed the assessment and treatment plan with the patient. The patient was provided an opportunity to ask questions and all were answered. The patient agreed with the plan and demonstrated an understanding of the instructions.   The patient was advised to call back or seek an in-person evaluation if the symptoms worsen or if the condition fails to improve as anticipated.  I provided 15 minutes of non-face-to-face time during this encounter.    Wallis BambergMario Tiegan Terpstra, PA-C  02/16/2019 4:39 PM         Wallis BambergMani, Nataliee Shurtz, PA-C 02/16/19 1703

## 2019-09-04 ENCOUNTER — Emergency Department
Admission: EM | Admit: 2019-09-04 | Discharge: 2019-09-04 | Disposition: A | Discharge status: Home / Self Care | Payer: Medicaid Other | Attending: Emergency Medicine | Admitting: Emergency Medicine

## 2019-09-04 ENCOUNTER — Other Ambulatory Visit: Payer: Self-pay

## 2019-09-04 ENCOUNTER — Emergency Department: Payer: Medicaid Other

## 2019-09-04 ENCOUNTER — Encounter: Payer: Self-pay | Admitting: Emergency Medicine

## 2019-09-04 DIAGNOSIS — R0789 Other chest pain: Secondary | ICD-10-CM | POA: Diagnosis not present

## 2019-09-04 LAB — CBC
HCT: 39.5 % (ref 36.0–46.0)
Hemoglobin: 12.8 g/dL (ref 12.0–15.0)
MCH: 27.3 pg (ref 26.0–34.0)
MCHC: 32.4 g/dL (ref 30.0–36.0)
MCV: 84.2 fL (ref 80.0–100.0)
Platelets: 275 10*3/uL (ref 150–400)
RBC: 4.69 MIL/uL (ref 3.87–5.11)
RDW: 12.3 % (ref 11.5–15.5)
WBC: 7 10*3/uL (ref 4.0–10.5)
nRBC: 0 % (ref 0.0–0.2)

## 2019-09-04 LAB — BASIC METABOLIC PANEL
Anion gap: 7 (ref 5–15)
BUN: 7 mg/dL (ref 6–20)
CO2: 24 mmol/L (ref 22–32)
Calcium: 9 mg/dL (ref 8.9–10.3)
Chloride: 105 mmol/L (ref 98–111)
Creatinine, Ser: 0.95 mg/dL (ref 0.44–1.00)
GFR calc Af Amer: >60 mL/min (ref >60)
GFR calc non Af Amer: >60 mL/min (ref >60)
Glucose, Bld: 123 mg/dL — ABNORMAL HIGH (ref 70–99)
Potassium: 3.8 mmol/L (ref 3.5–5.1)
Sodium: 136 mmol/L (ref 135–145)

## 2019-09-04 LAB — TROPONIN I (HIGH SENSITIVITY): Troponin I (High Sensitivity): <2 ng/L (ref <18)

## 2019-09-04 MED ORDER — KETOROLAC TROMETHAMINE 30 MG/ML IJ SOLN: 30.0000 mg | Freq: Once | INTRAMUSCULAR | Status: DC

## 2019-09-04 MED ORDER — KETOROLAC TROMETHAMINE 30 MG/ML IJ SOLN
30.0000 mg | Freq: Once | INTRAMUSCULAR | Status: AC
  Administered 2019-09-04: 30 mg via INTRAMUSCULAR
  Filled 2019-09-04: qty 1

## 2019-09-04 MED ORDER — NAPROXEN 500 MG PO TABS
500.0000 mg | ORAL_TABLET | Freq: Two times a day (BID) | ORAL | 2 refills | Status: DC
Start: 2019-09-04 — End: 2019-11-01

## 2019-09-04 NOTE — ED Triage Notes (Addendum)
Pt c/o mid chest pain and back pain intermittent for 2 days.  + nausea today but no other associated sx.  Unlabored. VSS.  Ambulatory.  Pain worse after eating. No gallbladder.  Dad hx stomach ulcers. No travel or birth control

## 2019-09-04 NOTE — ED Provider Notes (Signed)
[facility] Emergency Department Provider Note   ____________________________________________    I have reviewed the triage vital signs and the nursing notes.   HISTORY  Chief Complaint Chest Pain     HPI Tammy Frost is a 39 y.o. female who presents with complaints of chest discomfort.  Patient describes discomfort on the lateral borders of her sternum bilaterally.  She reports it is worse with palpation.  She reports has had this before and was diagnosed with costochondritis, that was some time ago, she was prescribed NSAIDs at that time.  She has tried Motrin with some improvement.  No fevers or chills.  No cough.  No leg pain or swelling.  No history of PE.  No nausea or vomiting.  No diaphoresis.  No injury to the area.  Past Medical History:  Diagnosis Date  . Anxiety   . Gallstones   . UTI (urinary tract infection) 07-20-15    Patient Active Problem List   Diagnosis Date Noted  . Appendicitis 09/04/2017  . Acute appendicitis   . Chronic cholecystitis with calculus   . Cholelithiasis 07/22/2015    Past Surgical History:  Procedure Laterality Date  . CESAREAN SECTION  12/27/2011  . CESAREAN SECTION  12/31/2013  . CHOLECYSTECTOMY N/A 09/02/2015   Procedure: LAPAROSCOPIC CHOLECYSTECTOMY WITH INTRAOPERATIVE CHOLANGIOGRAM;  Surgeon: Gladis Riffle, MD;  Location: [facility] ORS;  Service: General;  Laterality: N/A;  . LAPAROSCOPIC APPENDECTOMY N/A 09/04/2017   Procedure: APPENDECTOMY LAPAROSCOPIC;  Surgeon: Leafy Ro, MD;  Location: [facility] ORS;  Service: General;  Laterality: N/A;    Prior to Admission medications   Medication Sig Start Date End Date Taking? Authorizing Provider  amoxicillin-clavulanate (AUGMENTIN) 875-125 MG tablet Take 1 tablet by mouth every 12 (twelve) hours. 01/30/19   Cathie Hoops, Amy V, PA-C  naproxen (NAPROSYN) 500 MG tablet Take 1 tablet (500 mg total) by mouth 2 (two) times daily with a meal. 09/04/19   Jene Every, MD    ondansetron (ZOFRAN-ODT) 8 MG disintegrating tablet Take 1 tablet (8 mg total) by mouth every 8 (eight) hours as needed for nausea or vomiting. 02/16/19   Wallis Bamberg, PA-C  albuterol (PROVENTIL HFA;VENTOLIN HFA) 108 (90 Base) MCG/ACT inhaler Inhale 2 puffs into the lungs every 6 (six) hours as needed for wheezing or shortness of breath. Patient not taking: Reported on 07/10/2018 10/03/17 09/19/18  Nita Sickle, MD  fluticasone [facility]) 50 MCG/ACT nasal spray Place 1 spray into both nostrils daily. 10/08/18 01/30/19  Linus Mako B, NP  ipratropium (ATROVENT) 0.06 % nasal spray Place 2 sprays into both nostrils 4 (four) times daily. Patient not taking: Reported on 07/10/2018 07/09/18 09/19/18  Ofilia Neas, PA-C     Allergies Flagyl [metronidazole] and Sulfa antibiotics  Family History  Problem Relation Age of Onset  . Cancer Maternal Grandmother        lung  . Emphysema Maternal Grandmother   . Cancer Maternal Grandfather        pancreatic  . Hypertension Mother   . Alcohol abuse Neg Hx   . Arthritis Neg Hx   . Asthma Neg Hx   . Birth defects Neg Hx   . COPD Neg Hx   . Depression Neg Hx   . Diabetes Neg Hx   . Drug abuse Neg Hx   . Early death Neg Hx   . Hearing loss Neg Hx   . Heart disease Neg Hx   . Hyperlipidemia Neg Hx   . Kidney  disease Neg Hx   . Learning disabilities Neg Hx   . Mental illness Neg Hx   . Mental retardation Neg Hx   . Miscarriages / Stillbirths Neg Hx   . Vision loss Neg Hx   . Stroke Neg Hx   . Varicose Veins Neg Hx     Social History Social History   Tobacco Use  . Smoking status: Never Smoker  . Smokeless tobacco: Never Used  Substance Use Topics  . Alcohol use: No  . Drug use: No    Review of Systems  Constitutional: No fever/chills Eyes: No visual changes.  ENT: No sore throat. Cardiovascular: As above Respiratory: Denies shortness of breath. Gastrointestinal: No abdominal pain.   Genitourinary: Negative for  dysuria. Musculoskeletal: As above Skin: Negative for rash. Neurological: Negative for headaches    ____________________________________________   PHYSICAL EXAM:  VITAL SIGNS: ED Triage Vitals  Enc Vitals Group     BP 09/04/19 1224 (!) 149/91     Pulse Rate 09/04/19 1224 (!) 103     Resp 09/04/19 1224 18     Temp 09/04/19 1224 97.8 F (36.6 C)     Temp Source 09/04/19 1224 Oral     SpO2 09/04/19 1224 100 %     Weight 09/04/19 1223 87.5 kg (193 lb)     Height 09/04/19 1223 1.575 m (5\' 2" )     Head Circumference --      Peak Flow --      Pain Score 09/04/19 1231 4     Pain Loc --      Pain Edu? --      Excl. in GC? --     Constitutional: Alert and oriented.  Nose: No congestion/rhinnorhea. Mouth/Throat: Mucous membranes are moist.    Cardiovascular: Normal rate, regular rhythm. Grossly normal heart sounds.  Good peripheral circulation.  Tenderness palpation along the sternal border bilaterally no erythema or rash or bruising.  No bony abnormalities Respiratory: Normal respiratory effort.  No retractions. Lungs CTAB. Gastrointestinal: Soft and nontender. No distention.  No CVA tenderness.  Musculoskeletal: No lower extremity tenderness nor edema.  Warm and well perfused Neurologic:  Normal speech and language. No gross focal neurologic deficits are appreciated.  Skin:  Skin is warm, dry and intact. No rash noted. Psychiatric: Mood and affect are normal. Speech and behavior are normal.  ____________________________________________   LABS (all labs ordered are listed, but only abnormal results are displayed)  Labs Reviewed  BASIC METABOLIC PANEL - Abnormal; Notable for the following components:      Result Value   Glucose, Bld 123 (*)    All other components within normal limits  CBC  POC URINE PREG, ED  TROPONIN I (HIGH SENSITIVITY)   ____________________________________________  EKG  ED ECG REPORT I, 09/06/19, the attending physician, personally  viewed and interpreted this ECG.  Date: 09/04/2019  Rhythm: normal sinus rhythm QRS Axis: normal Intervals: normal ST/T Wave abnormalities: normal Narrative Interpretation: no evidence of acute ischemia  ____________________________________________  RADIOLOGY  Chest x-ray viewed by me, no infiltrate or abnormality ____________________________________________   PROCEDURES  Procedure(s) performed: No  Procedures   Critical Care performed: No ____________________________________________   INITIAL IMPRESSION / ASSESSMENT AND PLAN / ED COURSE  Pertinent labs & imaging results that were available during my care of the patient were reviewed by me and considered in my medical decision making (see chart for details).  Patient well-appearing in no acute distress.  Presents with anterior chest wall pain.  Exam is suspicious for costochondritis given location and tenderness.  X-ray is negative for pneumonia.  Lab work is quite reassuring, troponin is undetectable.  EKG is normal.  Will treat with IM Toradol, start on naproxen, outpatient follow-up as needed.    ____________________________________________   FINAL CLINICAL IMPRESSION(S) / ED DIAGNOSES  Final diagnoses:  Chest wall pain        Note:  This document was prepared using Dragon voice recognition software and may include unintentional dictation errors.   Lavonia Drafts, MD 09/04/19 [phone]

## 2019-10-05 IMAGING — CR DG CHEST 2V
1 series · 2 of 2 positions shown · non-contrast
Comparison: 01/10/2017

CLINICAL DATA: Pt reports feeling short of breath x 3 days. States
she came into contact with black mold and became concerned it was
the cause of her trouble breathing. Denies any other symptoms.

EXAM:
CHEST - 2 VIEW

[Series 1: dg chest 2 view · 0.14mm/px · 2 of 2 slices shown]
[im 1/2]
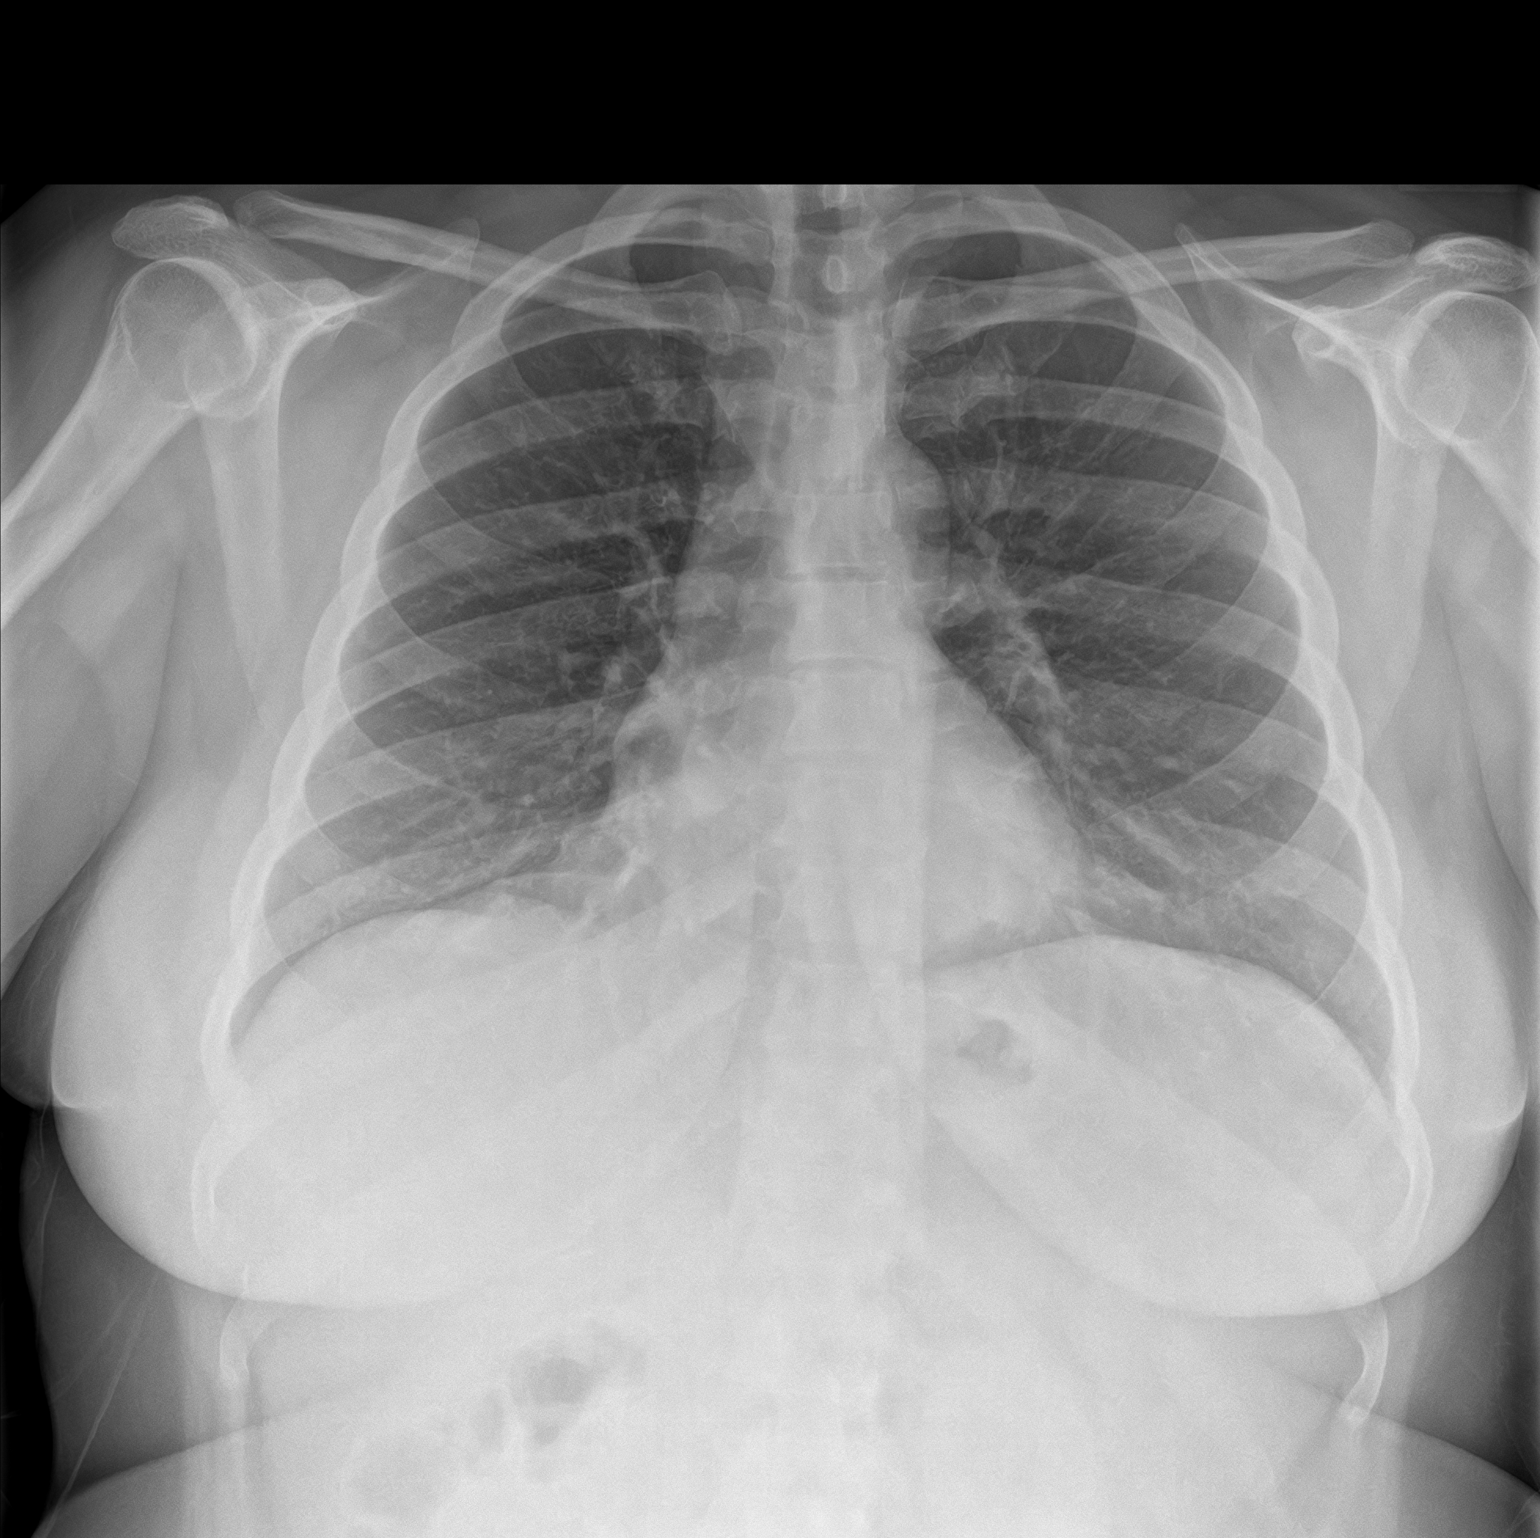
[im 2/2]
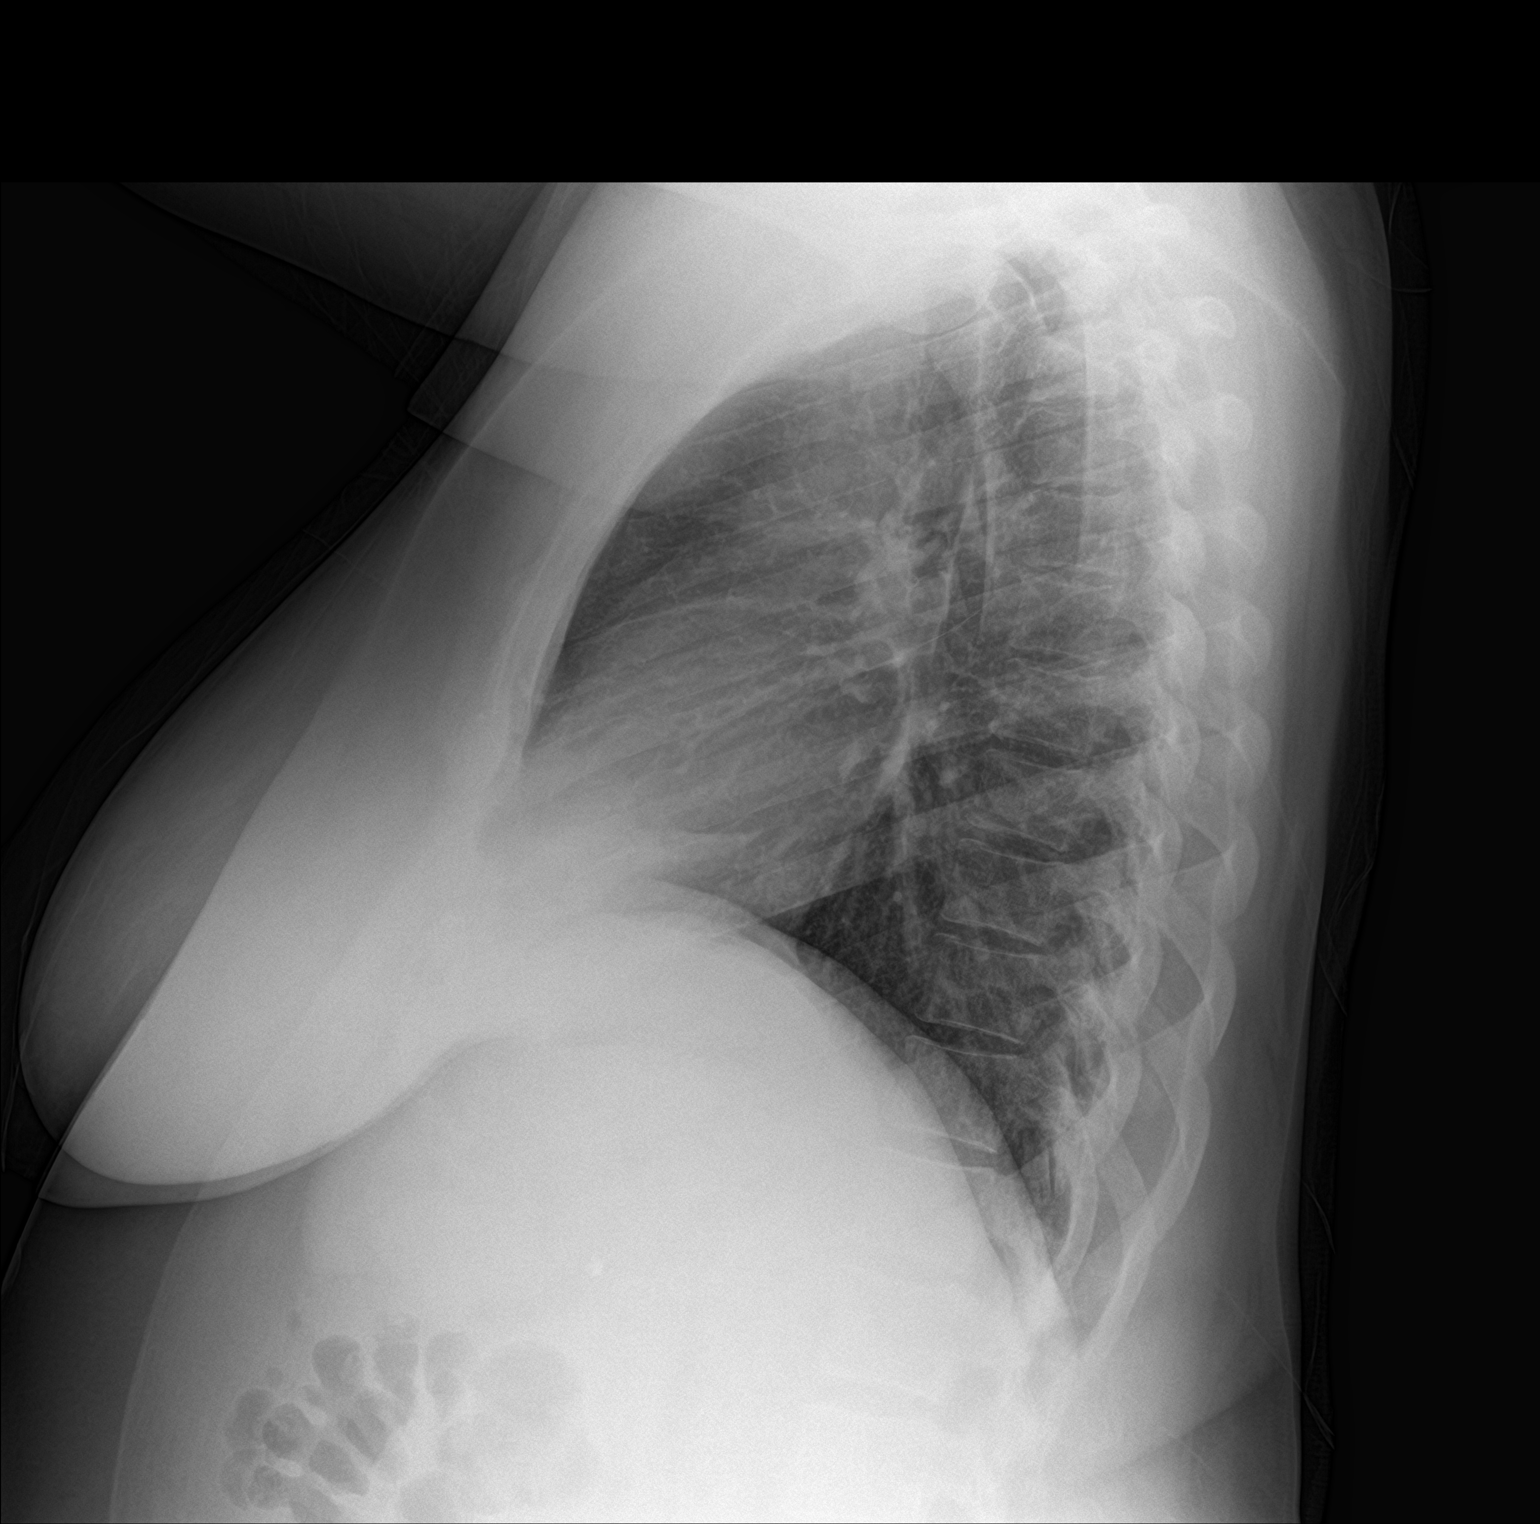

[2 of 2 positions shown; findings below may reference images not displayed]

FINDINGS: Lungs are clear.

Heart size and mediastinal contours are within normal limits.

No effusion.

Visualized bones unremarkable.  Cholecystectomy clips.
IMPRESSION: No acute cardiopulmonary disease.

## 2019-10-18 DIAGNOSIS — Z20822 Contact with and (suspected) exposure to covid-19: Secondary | ICD-10-CM | POA: Diagnosis not present

## 2019-11-01 ENCOUNTER — Other Ambulatory Visit: Payer: Self-pay

## 2019-11-01 ENCOUNTER — Encounter: Payer: Self-pay | Admitting: Nurse Practitioner

## 2019-11-01 ENCOUNTER — Ambulatory Visit: Payer: Medicaid Other | Admitting: Nurse Practitioner

## 2019-11-01 VITALS — BP 120/78 | HR 96 | Temp 98.6°F | Ht 62.0 in | Wt 219.0 lb

## 2019-11-01 DIAGNOSIS — F329 Major depressive disorder, single episode, unspecified: Secondary | ICD-10-CM | POA: Insufficient documentation

## 2019-11-01 DIAGNOSIS — F418 Other specified anxiety disorders: Secondary | ICD-10-CM

## 2019-11-01 DIAGNOSIS — F32A Depression, unspecified: Secondary | ICD-10-CM

## 2019-11-01 NOTE — Assessment & Plan Note (Signed)
Referral in to [facility]. Will  get CPE and labs

## 2019-11-01 NOTE — Patient Instructions (Addendum)
Please set up a lab appt and office visit for physical to follow. Please make fasting lab appt and OV to follow in 1 week for CPE per her schedule  I recommend that you get the Covid vaccine.   [facility]   Living With Depression Everyone experiences occasional disappointment, sadness, and loss in their lives. When you are feeling down, blue, or sad for at least 2 weeks in a row, it may mean that you have depression. Depression can affect your thoughts and feelings, relationships, daily activities, and physical health. It is caused by changes in the way your brain functions. If you receive a diagnosis of depression, your health care provider will tell you which type of depression you have and what treatment options are available to you. If you are living with depression, there are ways to help you recover from it and also ways to prevent it from coming back. How to cope with lifestyle changes Coping with stress     Stress is your body's reaction to life changes and events, both good and bad. Stressful situations may include:  Getting married.  The death of a spouse.  Losing a job.  Retiring.  Having a baby. Stress can last just a few hours or it can be ongoing. Stress can play a major role in depression, so it is important to learn both how to cope with stress and how to think about it differently. Talk with your health care provider or a counselor if you would like to learn more about stress reduction. He or she may suggest some stress reduction techniques, such as:  Music therapy. This can include creating music or listening to music. Choose music that you enjoy and that inspires you.  Mindfulness-based meditation. This kind of meditation can be done while sitting or walking. It involves being aware of your normal breaths, rather than trying to control your breathing.  Centering prayer. This is a kind of meditation that involves focusing  on a spiritual word or phrase. Choose a word, phrase, or sacred image that is meaningful to you and that brings you peace.  Deep breathing. To do this, expand your stomach and inhale slowly through your nose. Hold your breath for 3-5 seconds, then exhale slowly, allowing your stomach muscles to relax.  Muscle relaxation. This involves intentionally tensing muscles then relaxing them. Choose a stress reduction technique that fits your lifestyle and personality. Stress reduction techniques take time and practice to develop. Set aside 5-15 minutes a day to do them. Therapists can offer training in these techniques. The training may be covered by some insurance plans. Other things you can do to manage stress include:  Keeping a stress diary. This can help you learn what triggers your stress and ways to control your response.  Understanding what your limits are and saying no to requests or events that lead to a schedule that is too full.  Thinking about how you respond to certain situations. You may not be able to control everything, but you can control how you react.  Adding humor to your life by watching funny films or TV shows.  Making time for activities that help you relax and not feeling guilty about spending your time this way.  Medicines Your health care provider may suggest certain medicines if he or she feels that they will help improve your condition. Avoid using alcohol and other substances that may prevent your medicines from working properly (may interact).  It is also important to:  Talk with your pharmacist or health care provider about all the medicines that you take, their possible side effects, and what medicines are safe to take together.  Make it your goal to take part in all treatment decisions (shared decision-making). This includes giving input on the side effects of medicines. It is best if shared decision-making with your health care provider is part of your total treatment  plan. If your health care provider prescribes a medicine, you may not notice the full benefits of it for 4-8 weeks. Most people who are treated for depression need to be on medicine for at least 6-12 months after they feel better. If you are taking medicines as part of your treatment, do not stop taking medicines without first talking to your health care provider. You may need to have the medicine slowly decreased (tapered) over time to decrease the risk of harmful side effects. Relationships Your health care provider may suggest family therapy along with individual therapy and drug therapy. While there may not be family problems that are causing you to feel depressed, it is still important to make sure your family learns as much as they can about your mental health. Having your family's support can help make your treatment successful. How to recognize changes in your condition Everyone has a different response to treatment for depression. Recovery from major depression happens when you have not had signs of major depression for two months. This may mean that you will start to:  Have more interest in doing activities.  Feel less hopeless than you did 2 months ago.  Have more energy.  Overeat less often, or have better or improving appetite.  Have better concentration. Your health care provider will work with you to decide the next steps in your recovery. It is also important to recognize when your condition is getting worse. Watch for these signs:  Having fatigue or low energy.  Eating too much or too little.  Sleeping too much or too little.  Feeling restless, agitated, or hopeless.  Having trouble concentrating or making decisions.  Having unexplained physical complaints.  Feeling irritable, angry, or aggressive. Get help as soon as you or your family members notice these symptoms coming back. How to get support and help from others How to talk with friends and family members about  your condition  Talking to friends and family members about your condition can provide you with one way to get support and guidance. Reach out to trusted friends or family members, explain your symptoms to them, and let them know that you are working with a health care provider to treat your depression. Financial resources Not all insurance plans cover mental health care, so it is important to check with your insurance carrier. If paying for co-pays or counseling services is a problem, search for a local or county mental health care center. They may be able to offer public mental health care services at low or no cost when you are not able to see a private health care provider. If you are taking medicine for depression, you may be able to get the generic form, which may be less expensive. Some makers of prescription medicines also offer help to patients who cannot afford the medicines they need. Follow these instructions at home:   Get the right amount and quality of sleep.  Cut down on using caffeine, tobacco, alcohol, and other potentially harmful substances.  Try to exercise, such as walking or lifting  small weights.  Take over-the-counter and prescription medicines only as told by your health care provider.  Eat a healthy diet that includes plenty of vegetables, fruits, whole grains, low-fat dairy products, and lean protein. Do not eat a lot of foods that are high in solid fats, added sugars, or salt.  Keep all follow-up visits as told by your health care provider. This is important. Contact a health care provider if:  You stop taking your antidepressant medicines, and you have any of these symptoms: ? Nausea. ? Headache. ? Feeling lightheaded. ? Chills and body aches. ? Not being able to sleep (insomnia).  You or your friends and family think your depression is getting worse. Get help right away if:  You have thoughts of hurting yourself or others. If you ever feel like you may  hurt yourself or others, or have thoughts about taking your own life, get help right away. You can go to your nearest emergency department or call:  Your local emergency services (911 in the [place]  A suicide crisis helpline, such as the National Suicide Prevention Lifeline at (574)216-6124. This is open 24-hours a day. Summary  If you are living with depression, there are ways to help you recover from it and also ways to prevent it from coming back.  Work with your health care team to create a management plan that includes counseling, stress management techniques, and healthy lifestyle habits. This information is not intended to replace advice given to you by your health care provider. Make sure you discuss any questions you have with your health care provider. Document Revised: 06/23/2018 Document Reviewed: 02/02/2016 Elsevier Patient Education  2020 [employer] Inc[employer]  Managing Anxiety, Adult After being diagnosed with an anxiety disorder, you may be relieved to know why you have felt or behaved a certain way. You may also feel overwhelmed about the treatment ahead and what it will mean for your life. With care and support, you can manage this condition and recover from it. How to manage lifestyle changes Managing stress and anxiety  Stress is your body's reaction to life changes and events, both good and bad. Most stress will last just a few hours, but stress can be ongoing and can lead to more than just stress. Although stress can play a major role in anxiety, it is not the same as anxiety. Stress is usually caused by something external, such as a deadline, test, or competition. Stress normally passes after the triggering event has ended.  Anxiety is caused by something internal, such as imagining a terrible outcome or worrying that something will go wrong that will devastate you. Anxiety often does not go away even after the triggering event is over, and it can become long-term (chronic)  worry. It is important to understand the differences between stress and anxiety and to manage your stress effectively so that it does not lead to an anxious response. Talk with your health care provider or a counselor to learn more about reducing anxiety and stress. He or she may suggest tension reduction techniques, such as:  Music therapy. This can include creating or listening to music that you enjoy and that inspires you.  Mindfulness-based meditation. This involves being aware of your normal breaths while not trying to control your breathing. It can be done while sitting or walking.  Centering prayer. This involves focusing on a word, phrase, or sacred image that means something to you and brings you peace.  Deep breathing. To do this, expand your stomach  and inhale slowly through your nose. Hold your breath for 3-5 seconds. Then exhale slowly, letting your stomach muscles relax.  Self-talk. This involves identifying thought patterns that lead to anxiety reactions and changing those patterns.  Muscle relaxation. This involves tensing muscles and then relaxing them. Choose a tension reduction technique that suits your lifestyle and personality. These techniques take time and practice. Set aside 5-15 minutes a day to do them. Therapists can offer counseling and training in these techniques. The training to help with anxiety may be covered by some insurance plans. Other things you can do to manage stress and anxiety include:  Keeping a stress/anxiety diary. This can help you learn what triggers your reaction and then learn ways to manage your response.  Thinking about how you react to certain situations. You may not be able to control everything, but you can control your response.  Making time for activities that help you relax and not feeling guilty about spending your time in this way.  Visual imagery and yoga can help you stay calm and relax.  Medicines Medicines can help ease symptoms.  Medicines for anxiety include:  Anti-anxiety drugs.  Antidepressants. Medicines are often used as a primary treatment for anxiety disorder. Medicines will be prescribed by a health care provider. When used together, medicines, psychotherapy, and tension reduction techniques may be the most effective treatment. Relationships Relationships can play a big part in helping you recover. Try to spend more time connecting with trusted friends and family members. Consider going to couples counseling, taking family education classes, or going to family therapy. Therapy can help you and others better understand your condition. How to recognize changes in your anxiety Everyone responds differently to treatment for anxiety. Recovery from anxiety happens when symptoms decrease and stop interfering with your daily activities at home or work. This may mean that you will start to:  Have better concentration and focus. Worry will interfere less in your daily thinking.  Sleep better.  Be less irritable.  Have more energy.  Have improved memory. It is important to recognize when your condition is getting worse. Contact your health care provider if your symptoms interfere with home or work and you feel like your condition is not improving. Follow these instructions at home: Activity  Exercise. Most adults should do the following: ? Exercise for at least 150 minutes each week. The exercise should increase your heart rate and make you sweat (moderate-intensity exercise). ? Strengthening exercises at least twice a week.  Get the right amount and quality of sleep. Most adults need 7-9 hours of sleep each night. Lifestyle   Eat a healthy diet that includes plenty of vegetables, fruits, whole grains, low-fat dairy products, and lean protein. Do not eat a lot of foods that are high in solid fats, added sugars, or salt.  Make choices that simplify your life.  Do not use any products that contain nicotine or  tobacco, such as cigarettes, e-cigarettes, and chewing tobacco. If you need help quitting, ask your health care provider.  Avoid caffeine, alcohol, and certain over-the-counter cold medicines. These may make you feel worse. Ask your pharmacist which medicines to avoid. General instructions  Take over-the-counter and prescription medicines only as told by your health care provider.  Keep all follow-up visits as told by your health care provider. This is important. Where to find support You can get help and support from these sources:  Self-help groups.  Online and [employer].  A trusted spiritual leader.  Couples counseling.  Family education classes.  Family therapy. Where to find more information You may find that joining a support group helps you deal with your anxiety. The following sources can help you locate counselors or support groups near you:  Mental Health America: www.mentalhealthamerica.net  Anxiety and Depression Association of [place] (ADAA): ProgramCam.de  [employer] Mental Illness (NAMI): www.nami.org Contact a health care provider if you:  Have a hard time staying focused or finishing daily tasks.  Spend many hours a day feeling worried about everyday life.  Become exhausted by worry.  Start to have headaches, feel tense, or have nausea.  Urinate more than normal.  Have diarrhea. Get help right away if you have:  A racing heart and shortness of breath.  Thoughts of hurting yourself or others. If you ever feel like you may hurt yourself or others, or have thoughts about taking your own life, get help right away. You can go to your nearest emergency department or call:  Your local emergency services (911 in the [place]  A suicide crisis helpline, such as the National Suicide Prevention Lifeline at 615-130-3822. This is open 24 hours a day. Summary  Taking steps to learn and use tension reduction techniques can help calm you  and help prevent triggering an anxiety reaction.  When used together, medicines, psychotherapy, and tension reduction techniques may be the most effective treatment.  Family, friends, and partners can play a big part in helping you recover from an anxiety disorder. This information is not intended to replace advice given to you by your health care provider. Make sure you discuss any questions you have with your health care provider. Document Revised: 08/01/2018 Document Reviewed: 08/01/2018 Elsevier Patient Education  2020 [employer].
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
## 2019-11-08 ENCOUNTER — Ambulatory Visit (INDEPENDENT_AMBULATORY_CARE_PROVIDER_SITE_OTHER): Payer: Medicaid Other | Admitting: Nurse Practitioner

## 2019-11-08 ENCOUNTER — Other Ambulatory Visit: Payer: Self-pay

## 2019-11-08 ENCOUNTER — Encounter: Payer: Self-pay | Admitting: Nurse Practitioner

## 2019-11-08 DIAGNOSIS — Z0000 Encounter for general adult medical examination without abnormal findings: Secondary | ICD-10-CM

## 2019-11-08 DIAGNOSIS — E538 Deficiency of other specified B group vitamins: Secondary | ICD-10-CM | POA: Diagnosis not present

## 2019-11-08 DIAGNOSIS — F329 Major depressive disorder, single episode, unspecified: Secondary | ICD-10-CM | POA: Diagnosis not present

## 2019-11-08 DIAGNOSIS — E559 Vitamin D deficiency, unspecified: Secondary | ICD-10-CM

## 2019-11-08 DIAGNOSIS — F32A Depression, unspecified: Secondary | ICD-10-CM

## 2019-11-08 DIAGNOSIS — F419 Anxiety disorder, unspecified: Secondary | ICD-10-CM

## 2019-11-08 LAB — CBC WITH DIFFERENTIAL/PLATELET
Basophils Absolute: 0 10*3/uL (ref 0.0–0.1)
Basophils Relative: 0.5 % (ref 0.0–3.0)
Eosinophils Absolute: 0.3 10*3/uL (ref 0.0–0.7)
Eosinophils Relative: 4.5 % (ref 0.0–5.0)
HCT: 38.7 % (ref 36.0–46.0)
Hemoglobin: 12.8 g/dL (ref 12.0–15.0)
Lymphocytes Relative: 23.4 % (ref 12.0–46.0)
Lymphs Abs: 1.7 10*3/uL (ref 0.7–4.0)
MCHC: 33 g/dL (ref 30.0–36.0)
MCV: 85.1 fl (ref 78.0–100.0)
Monocytes Absolute: 0.4 10*3/uL (ref 0.1–1.0)
Monocytes Relative: 5.2 % (ref 3.0–12.0)
Neutro Abs: 4.8 10*3/uL (ref 1.4–7.7)
Neutrophils Relative %: 66.4 % (ref 43.0–77.0)
Platelets: 224 10*3/uL (ref 150.0–400.0)
RBC: 4.55 Mil/uL (ref 3.87–5.11)
RDW: 13.9 % (ref 11.5–15.5)
WBC: 7.2 10*3/uL (ref 4.0–10.5)

## 2019-11-08 LAB — TSH: TSH: 2.11 u[IU]/mL (ref 0.35–4.50)

## 2019-11-08 LAB — LIPID PANEL
Cholesterol: 190 mg/dL (ref 0–200)
HDL: 39.3 mg/dL (ref >39.00)
LDL Cholesterol: 119 mg/dL — ABNORMAL HIGH (ref 0–99)
NonHDL: 150.56
Total CHOL/HDL Ratio: 5
Triglycerides: 160 mg/dL — ABNORMAL HIGH (ref 0.0–149.0)
VLDL: 32 mg/dL (ref 0.0–40.0)

## 2019-11-08 LAB — COMPREHENSIVE METABOLIC PANEL
ALT: 23 U/L (ref 0–35)
AST: 18 U/L (ref 0–37)
Albumin: 4.1 g/dL (ref 3.5–5.2)
Alkaline Phosphatase: 45 U/L (ref 39–117)
BUN: 6 mg/dL (ref 6–23)
CO2: 26 mEq/L (ref 19–32)
Calcium: 9.2 mg/dL (ref 8.4–10.5)
Chloride: 104 mEq/L (ref 96–112)
Creatinine, Ser: 0.92 mg/dL (ref 0.40–1.20)
GFR: 68.26 mL/min (ref >60.00)
Glucose, Bld: 89 mg/dL (ref 70–99)
Potassium: 3.9 mEq/L (ref 3.5–5.1)
Sodium: 137 mEq/L (ref 135–145)
Total Bilirubin: 0.5 mg/dL (ref 0.2–1.2)
Total Protein: 7.5 g/dL (ref 6.0–8.3)

## 2019-11-08 LAB — HEMOGLOBIN A1C: Hgb A1c MFr Bld: 5.2 % (ref 4.6–6.5)

## 2019-11-08 LAB — VITAMIN D 25 HYDROXY (VIT D DEFICIENCY, FRACTURES): VITD: 18.04 ng/mL — ABNORMAL LOW (ref 30.00–100.00)

## 2019-11-08 LAB — B12 AND FOLATE PANEL
Folate: 6 ng/mL (ref >5.9)
Vitamin B-12: 163 pg/mL — ABNORMAL LOW (ref 211–911)

## 2019-11-08 MED ORDER — VITAMIN D (ERGOCALCIFEROL) 1.25 MG (50000 UNIT) PO CAPS: 50000.0000 [IU] | ORAL_CAPSULE | ORAL | 0 refills | Status: DC

## 2019-11-08 MED ORDER — CYANOCOBALAMIN 1000 MCG/ML IJ SOLN
INTRAMUSCULAR | 15 refills | Status: DC
Start: 2019-11-08 — End: 2023-12-09

## 2019-11-08 NOTE — Patient Instructions (Addendum)
Labs today  Referral in to the Weight Management team- let me know if no call in 2 weeks   Referral in to Behavioral Therapy:  We are scheduling our Medicaid into [place] of 2022 we are referring patient to the new Medicaid office In [place].  In [facility] Phone [phone]    Continue to work on healthy lifestyle.  Recommend exercise as you are able.  You can start slowly after 10 to 15 minutes 3 days a week and increase to 5 days a week or increase your timeframe.  Try to do something you enjoy.  Walking is very good.  Needs good sleep hygiene, sleep well, eat nutritious foods high in lean protein,  Veggies, and  low fat dairy.  Mindful based stress reduction, relaxation hobbies that you enjoy.   Covid vaccine as planned on Monday.  . Preventive Care 37-30 Years Old, Female Preventive care refers to visits with your health care provider and lifestyle choices that can promote health and wellness. This includes:  A yearly physical exam. This may also be called an annual well check.  Regular dental visits and eye exams.  Immunizations.  Screening for certain conditions.  Healthy lifestyle choices, such as eating a healthy diet, getting regular exercise, not using drugs or products that contain nicotine and tobacco, and limiting alcohol use. What can I expect for my preventive care visit? Physical exam Your health care provider will check your:  Height and weight. This may be used to calculate body mass index (BMI), which tells if you are at a healthy weight.  Heart rate and blood pressure.  Skin for abnormal spots. Counseling Your health care provider may ask you questions about your:  Alcohol, tobacco, and drug use.  Emotional well-being.  Home and relationship well-being.  Sexual activity.  Eating habits.  Work and work Statistician.  Method of birth control.  Menstrual cycle.  Pregnancy history. What immunizations do I need?  Influenza (flu) vaccine  This  is recommended every year. Tetanus, diphtheria, and pertussis (Tdap) vaccine  You may need a Td booster every 10 years. Varicella (chickenpox) vaccine  You may need this if you have not been vaccinated. Human papillomavirus (HPV) vaccine  If recommended by your health care provider, you may need three doses over 6 months. Measles, mumps, and rubella (MMR) vaccine  You may need at least one dose of MMR. You may also need a second dose. Meningococcal conjugate (MenACWY) vaccine  One dose is recommended if you are age 58-21 years and a first-year college student living in a residence hall, or if you have one of several medical conditions. You may also need additional booster doses. Pneumococcal conjugate (PCV13) vaccine  You may need this if you have certain conditions and were not previously vaccinated. Pneumococcal polysaccharide (PPSV23) vaccine  You may need one or two doses if you smoke cigarettes or if you have certain conditions. Hepatitis A vaccine  You may need this if you have certain conditions or if you travel or work in places where you may be exposed to hepatitis A. Hepatitis B vaccine  You may need this if you have certain conditions or if you travel or work in places where you may be exposed to hepatitis B. Haemophilus influenzae type b (Hib) vaccine  You may need this if you have certain conditions. You may receive vaccines as individual doses or as more than one vaccine together in one shot (combination vaccines). Talk with your health care provider about the  risks and benefits of combination vaccines. What tests do I need?  Blood tests  Lipid and cholesterol levels. These may be checked every 5 years starting at age 43.  Hepatitis C test.  Hepatitis B test. Screening  Diabetes screening. This is done by checking your blood sugar (glucose) after you have not eaten for a while (fasting).  Sexually transmitted disease (STD) testing.  BRCA-related cancer  screening. This may be done if you have a family history of breast, ovarian, tubal, or peritoneal cancers.  Pelvic exam and Pap test. This may be done every 3 years starting at age 62. Starting at age 45, this may be done every 5 years if you have a Pap test in combination with an HPV test. Talk with your health care provider about your test results, treatment options, and if necessary, the need for more tests. Follow these instructions at home: Eating and drinking   Eat a diet that includes fresh fruits and vegetables, whole grains, lean protein, and low-fat dairy.  Take vitamin and mineral supplements as recommended by your health care provider.  Do not drink alcohol if: ? Your health care provider tells you not to drink. ? You are pregnant, may be pregnant, or are planning to become pregnant.  If you drink alcohol: ? Limit how much you have to 0-1 drink a day. ? Be aware of how much alcohol is in your drink. In the [place] one drink equals one 12 oz bottle of beer (355 mL), one 5 oz glass of wine (148 mL), or one 1 oz glass of hard liquor (44 mL). Lifestyle  Take daily care of your teeth and gums.  Stay active. Exercise for at least 30 minutes on 5 or more days each week.  Do not use any products that contain nicotine or tobacco, such as cigarettes, e-cigarettes, and chewing tobacco. If you need help quitting, ask your health care provider.  If you are sexually active, practice safe sex. Use a condom or other form of birth control (contraception) in order to prevent pregnancy and STIs (sexually transmitted infections). If you plan to become pregnant, see your health care provider for a preconception visit. What's next?  Visit your health care provider once a year for a well check visit.  Ask your health care provider how often you should have your eyes and teeth checked.  Stay up to date on all vaccines. This information is not intended to replace advice given to you by your  health care provider. Make sure you discuss any questions you have with your health care provider. Document Revised: 11/10/2017 Document Reviewed: 11/10/2017 Elsevier Patient Education  [place], Adult After being diagnosed with an anxiety disorder, you may be relieved to know why you have felt or behaved a certain way. You may also feel overwhelmed about the treatment ahead and what it will mean for your life. With care and support, you can manage this condition and recover from it. How to manage lifestyle changes Managing stress and anxiety  Stress is your body's reaction to life changes and events, both good and bad. Most stress will last just a few hours, but stress can be ongoing and can lead to more than just stress. Although stress can play a major role in anxiety, it is not the same as anxiety. Stress is usually caused by something external, such as a deadline, test, or competition. Stress normally passes after the triggering event has ended.  Anxiety is caused  by something internal, such as imagining a terrible outcome or worrying that something will go wrong that will devastate you. Anxiety often does not go away even after the triggering event is over, and it can become long-term (chronic) worry. It is important to understand the differences between stress and anxiety and to manage your stress effectively so that it does not lead to an anxious response. Talk with your health care provider or a counselor to learn more about reducing anxiety and stress. He or she may suggest tension reduction techniques, such as:  Music therapy. This can include creating or listening to music that you enjoy and that inspires you.  Mindfulness-based meditation. This involves being aware of your normal breaths while not trying to control your breathing. It can be done while sitting or walking.  Centering prayer. This involves focusing on a word, phrase, or sacred image that means  something to you and brings you peace.  Deep breathing. To do this, expand your stomach and inhale slowly through your nose. Hold your breath for 3-5 seconds. Then exhale slowly, letting your stomach muscles relax.  Self-talk. This involves identifying thought patterns that lead to anxiety reactions and changing those patterns.  Muscle relaxation. This involves tensing muscles and then relaxing them. Choose a tension reduction technique that suits your lifestyle and personality. These techniques take time and practice. Set aside 5-15 minutes a day to do them. Therapists can offer counseling and training in these techniques. The training to help with anxiety may be covered by some insurance plans. Other things you can do to manage stress and anxiety include:  Keeping a stress/anxiety diary. This can help you learn what triggers your reaction and then learn ways to manage your response.  Thinking about how you react to certain situations. You may not be able to control everything, but you can control your response.  Making time for activities that help you relax and not feeling guilty about spending your time in this way.  Visual imagery and yoga can help you stay calm and relax.  Medicines Medicines can help ease symptoms. Medicines for anxiety include:  Anti-anxiety drugs.  Antidepressants. Medicines are often used as a primary treatment for anxiety disorder. Medicines will be prescribed by a health care provider. When used together, medicines, psychotherapy, and tension reduction techniques may be the most effective treatment. Relationships Relationships can play a big part in helping you recover. Try to spend more time connecting with trusted friends and family members. Consider going to couples counseling, taking family education classes, or going to family therapy. Therapy can help you and others better understand your condition. How to recognize changes in your anxiety Everyone  responds differently to treatment for anxiety. Recovery from anxiety happens when symptoms decrease and stop interfering with your daily activities at home or work. This may mean that you will start to:  Have better concentration and focus. Worry will interfere less in your daily thinking.  Sleep better.  Be less irritable.  Have more energy.  Have improved memory. It is important to recognize when your condition is getting worse. Contact your health care provider if your symptoms interfere with home or work and you feel like your condition is not improving. Follow these instructions at home: Activity  Exercise. Most adults should do the following: ? Exercise for at least 150 minutes each week. The exercise should increase your heart rate and make you sweat (moderate-intensity exercise). ? Strengthening exercises at least twice a week.  Get  the right amount and quality of sleep. Most adults need 7-9 hours of sleep each night. Lifestyle   Eat a healthy diet that includes plenty of vegetables, fruits, whole grains, low-fat dairy products, and lean protein. Do not eat a lot of foods that are high in solid fats, added sugars, or salt.  Make choices that simplify your life.  Do not use any products that contain nicotine or tobacco, such as cigarettes, e-cigarettes, and chewing tobacco. If you need help quitting, ask your health care provider.  Avoid caffeine, alcohol, and certain over-the-counter cold medicines. These may make you feel worse. Ask your pharmacist which medicines to avoid. General instructions  Take over-the-counter and prescription medicines only as told by your health care provider.  Keep all follow-up visits as told by your health care provider. This is important. Where to find support You can get help and support from these sources:  Self-help groups.  Online and [employer].  A trusted spiritual leader.  Couples counseling.  Family education  classes.  Family therapy. Where to find more information You may find that joining a support group helps you deal with your anxiety. The following sources can help you locate counselors or support groups near you:  [place]: www.mentalhealth[place].net  Anxiety and Depression Association of [place] (ADAA): https://www.[place]ark.net/  National Alliance on Mental Illness (NAMI): www.nami.org Contact a health care provider if you:  Have a hard time staying focused or finishing daily tasks.  Spend many hours a day feeling worried about everyday life.  Become exhausted by worry.  Start to have headaches, feel tense, or have nausea.  Urinate more than normal.  Have diarrhea. Get help right away if you have:  A racing heart and shortness of breath.  Thoughts of hurting yourself or others. If you ever feel like you may hurt yourself or others, or have thoughts about taking your own life, get help right away. You can go to your nearest emergency department or call:  Your local emergency services (911 in the [place]  A suicide crisis helpline, such as the [place] at (864) 274-4157. This is open 24 hours a day. Summary  Taking steps to learn and use tension reduction techniques can help calm you and help prevent triggering an anxiety reaction.  When used together, medicines, psychotherapy, and tension reduction techniques may be the most effective treatment.  Family, friends, and partners can play a big part in helping you recover from an anxiety disorder. This information is not intended to replace advice given to you by your health care provider. Make sure you discuss any questions you have with your health care provider. Document Revised: 08/01/2018 Document Reviewed: 08/01/2018 Elsevier Patient Education  [place].

## 2019-11-08 NOTE — Progress Notes (Signed)
Established Patient Office Visit  Subjective:  Patient ID: Tammy Frost, female    DOB: 04/13/1980  Age: 39 y.o. MRN: [id number]  CC:  Chief Complaint  Patient presents with  . Annual Exam    CPE    HPI Tammy Frost is a 39 year old with anxiety and depression who established care last week.  She presents to get routine CPE laboratory studies and to complete physical exam without pap, and follow-up anxiety.   Anxiety/depression: She is not taking medication at this time. She prefers to speak with  Behavioral Medicine.  They are scheduling new Medicaid out in Tammy Frost 2022. Tammy Frost was given a phone number  [phone] to call to schedule an appt.   Morbid obesity/BMI 40: She would like referral to [facility].  Patient presents today for complete physical.  Immunizations: She has appt to get her first Covid vaccine on Mon.  Up-to-date tetanus.  She needs a flu vaccine and plans to get that in the near future. Diet: Trying to eat a healthy diet. Exercise: Does not get regular structured exercise trying to walk. Pap Smear: UTD through [facility]- Appt next month Mammogram:  Follows [facility]: Reports that she had her mammogram for a fatty lump at age 37 at [facility] and told to repeat before age 39. Vision: UTD due Jan 2022 Dentist: UTD  Past Medical History:  Diagnosis Date  . Anxiety   . Chicken pox   . Gallstones   . UTI (urinary tract infection) 07-20-15    Past Surgical History:  Procedure Laterality Date  . APPENDECTOMY    . CESAREAN SECTION  12/27/2011  . CESAREAN SECTION  12/31/2013  . CHOLECYSTECTOMY N/A 09/02/2015   Procedure: LAPAROSCOPIC CHOLECYSTECTOMY WITH INTRAOPERATIVE CHOLANGIOGRAM;  Surgeon: Gladis Riffle, MD;  Location: [facility] ORS;  Service: General;  Laterality: N/A;  . LAPAROSCOPIC APPENDECTOMY N/A 09/04/2017   Procedure: APPENDECTOMY LAPAROSCOPIC;  Surgeon: Leafy Ro, MD;  Location: [facility] ORS;  Service: General;  Laterality: N/A;     Family History  Problem Relation Age of Onset  . Cancer Maternal Grandmother        lung  . Emphysema Maternal Grandmother   . Depression Maternal Grandmother   . Heart disease Maternal Grandmother   . Hyperlipidemia Maternal Grandmother   . Hypertension Maternal Grandmother   . Stroke Maternal Grandmother   . Cancer Maternal Grandfather        pancreatic  . Hypertension Mother   . Depression Mother   . Hyperlipidemia Mother   . Asthma Son   . Cancer Paternal Grandmother   . Cancer Paternal Grandfather   . Heart disease Paternal Grandfather   . Alcohol abuse Neg Hx   . Arthritis Neg Hx   . Birth defects Neg Hx   . COPD Neg Hx   . Diabetes Neg Hx   . Drug abuse Neg Hx   . Early death Neg Hx   . Hearing loss Neg Hx   . Kidney disease Neg Hx   . Learning disabilities Neg Hx   . Mental illness Neg Hx   . Mental retardation Neg Hx   . Miscarriages / Stillbirths Neg Hx   . Vision loss Neg Hx   . Varicose Veins Neg Hx     Social History   Socioeconomic History  . Marital status: Single    Spouse name: Not on file  . Number of children: Not on file  . Years of education: Not  on file  . Highest education level: Not on file  Occupational History  . Occupation: unemployed  Tobacco Use  . Smoking status: Never Smoker  . Smokeless tobacco: Never Used  Vaping Use  . Vaping Use: Never used  Substance and Sexual Activity  . Alcohol use: No  . Drug use: No  . Sexual activity: Yes    Birth control/protection: None  Other Topics Concern  . Not on file  Social History Narrative   Single has  boyfriend and lives with mother Has 2 boys    Social Determinants of Health   Financial Resource Strain:   . Difficulty of Paying Living Expenses: Not on file  Food Insecurity:   . Worried About Programme researcher, broadcasting/film/videounning Out of Food in the Last Year: Not on file  . Ran Out of Food in the Last Year: Not on file  Transportation Needs:   . Lack of Transportation (Medical): Not on file  . Lack  of Transportation (Non-Medical): Not on file  Physical Activity:   . Days of Exercise per Week: Not on file  . Minutes of Exercise per Session: Not on file  Stress:   . Feeling of Stress : Not on file  Social Connections:   . Frequency of Communication with Friends and Family: Not on file  . Frequency of Social Gatherings with Friends and Family: Not on file  . Attends Religious Services: Not on file  . Active Member of Clubs or Organizations: Not on file  . Attends BankerClub or Organization Meetings: Not on file  . Marital Status: Not on file  Intimate Partner Violence:   . Fear of Current or Ex-Partner: Not on file  . Emotionally Abused: Not on file  . Physically Abused: Not on file  . Sexually Abused: Not on file    No outpatient medications prior to visit.   No facility-administered medications prior to visit.    Allergies  Allergen Reactions  . Flagyl [Metronidazole] Hives and Rash    Patient states her skins burn.  . Sulfa Antibiotics Hives and Rash    Patient states her skin burns.    Review of Systems  Constitutional: Negative for chills and fever.  HENT: Negative.   Respiratory: Negative.   Cardiovascular: Negative.   Gastrointestinal: Negative.   Neurological: Negative.   Hematological: Negative.   Psychiatric/Behavioral:       See HPI. Doing OK. No SI/HI.       Objective:    Physical Exam Vitals reviewed.  Constitutional:      Appearance: Normal appearance. She is obese.  HENT:     Head: Normocephalic and atraumatic.  Eyes:     Conjunctiva/sclera: Conjunctivae normal.     Pupils: Pupils are equal, round, and reactive to light.  Cardiovascular:     Rate and Rhythm: Normal rate and regular rhythm.     Pulses: Normal pulses.     Heart sounds: Normal heart sounds.  Pulmonary:     Effort: Pulmonary effort is normal.     Breath sounds: Normal breath sounds.  Abdominal:     Palpations: Abdomen is soft.     Tenderness: There is no abdominal tenderness.   Musculoskeletal:        General: Normal range of motion.     Cervical back: Normal range of motion and neck supple.  Skin:    General: Skin is warm and dry.  Neurological:     General: No focal deficit present.     Mental Status: She is  alert and oriented to person, place, and time.  Psychiatric:        Mood and Affect: Mood normal.        Behavior: Behavior normal.        Thought Content: Thought content normal.        Judgment: Judgment normal.     BP 112/68 (BP Location: Left Arm, Patient Position: Sitting, Cuff Size: Large)   Pulse 98   Temp 98.1 F (36.7 C) (Oral)   Ht 5\' 2"  (1.575 m)   Wt 220 lb (99.8 kg)   SpO2 98%   BMI 40.24 kg/m  Wt Readings from Last 3 Encounters:  11/08/19 220 lb (99.8 kg)  11/01/19 219 lb (99.3 kg)  09/04/19 193 lb (87.5 kg)   Pulse Readings from Last 3 Encounters:  11/08/19 98  11/01/19 96  09/04/19 83    BP Readings from Last 3 Encounters:  11/08/19 112/68  11/01/19 120/78  09/04/19 116/71    Lab Results  Component Value Date   CHOL 190 11/08/2019   HDL 39.30 11/08/2019   LDLCALC 119 (H) 11/08/2019   TRIG 160.0 (H) 11/08/2019   CHOLHDL 5 11/08/2019      Health Maintenance Due  Topic Date Due  . COVID-19 Vaccine (1) Never done  . PAP SMEAR-Modifier  Never done  . INFLUENZA VACCINE  10/14/2019    There are no preventive care reminders to display for this patient.  Lab Results  Component Value Date   TSH 2.11 11/08/2019   Lab Results  Component Value Date   WBC 7.2 11/08/2019   HGB 12.8 11/08/2019   HCT 38.7 11/08/2019   MCV 85.1 11/08/2019   PLT 224.0 11/08/2019   Lab Results  Component Value Date   NA 137 11/08/2019   K 3.9 11/08/2019   CO2 26 11/08/2019   GLUCOSE 89 11/08/2019   BUN 6 11/08/2019   CREATININE 0.92 11/08/2019   BILITOT 0.5 11/08/2019   ALKPHOS 45 11/08/2019   AST 18 11/08/2019   ALT 23 11/08/2019   PROT 7.5 11/08/2019   ALBUMIN 4.1 11/08/2019   CALCIUM 9.2 11/08/2019   ANIONGAP 7  09/04/2019   GFR 68.26 11/08/2019   Lab Results  Component Value Date   CHOL 190 11/08/2019   Lab Results  Component Value Date   HDL 39.30 11/08/2019   Lab Results  Component Value Date   LDLCALC 119 (H) 11/08/2019   Lab Results  Component Value Date   TRIG 160.0 (H) 11/08/2019   Lab Results  Component Value Date   CHOLHDL 5 11/08/2019   Lab Results  Component Value Date   HGBA1C 5.2 11/08/2019      Assessment & Plan:   Problem List Items Addressed This Visit      Other   Obesity, morbid, BMI 40.0-49.9 (HCC) - Primary   Relevant Orders   CBC with Differential/Platelet (Completed)   TSH (Completed)   Comprehensive metabolic panel (Completed)   Hemoglobin A1c (Completed)   Lipid panel (Completed)   Amb Ref to Medical Weight Management   Anxiety and depression   Relevant Orders   VITAMIN D 25 Hydroxy (Vit-D Deficiency, Fractures) (Completed)   B12 and Folate Panel (Completed)   Encounter for preventative adult health care examination   Relevant Orders   Hepatitis C antibody (Completed)   HIV Antibody (routine testing w rflx) (Completed)    Other Visit Diagnoses    Low serum vitamin B12       Vitamin D  deficiency       Relevant Orders   Vitamin D (25 hydroxy)   B12 deficiency       Relevant Orders   B12 and Folate Panel      Meds ordered this encounter  Medications  . Vitamin D, Ergocalciferol, (DRISDOL) 1.25 MG (50000 UNIT) CAPS capsule    Sig: Take 1 capsule (50,000 Units total) by mouth every 7 (seven) days.    Dispense:  8 capsule    Refill:  0    Order Specific Question:   Supervising Provider    Answer:   Dale Ward [id number]  . cyanocobalamin (,VITAMIN B-12,) 1000 MCG/ML injection    Sig: 1000 mcg (1 mg) injection once per week for four weeks, followed by 1000 mcg injection once per month.    Dispense:  1 mL    Refill:  15    Order Specific Question:   Supervising Provider    Answer:   Dale  [phone]   Labs today:  B12  and Vit D  deficiency: Ordered supplementation and f/up labs.   Referral in to the Weight Management team- let me know if no call in 2 weeks   Referral in to Behavioral Therapy:  We are scheduling our Medicaid into Tammy Frost of 2022 we are referring patient to the new Medicaid office In [place].  In [facility] Phone [phone]   Continue to work on healthy lifestyle.  Recommend exercise as you are able.  You can start slowly after 10 to 15 minutes 3 days a week and increase to 5 days a week or increase your timeframe.  Try to do something you enjoy.  Walking is very good.  Needs good sleep hygiene, sleep well, eat nutritious foods high in lean protein,  Veggies, and  low fat dairy.  Mindful based stress reduction, relaxation hobbies that you enjoy.   Covid vaccine as planned on Monday.   Follow-up: Return in about 3 months (around 02/08/2020).  This visit occurred during the SARS-CoV-2 public health emergency.  Safety protocols were in place, including screening questions prior to the visit, additional usage of staff PPE, and extensive cleaning of exam room while observing appropriate contact time as indicated for disinfecting solutions.    Amedeo Kinsman, NP

## 2019-11-09 ENCOUNTER — Encounter: Payer: Self-pay | Admitting: Nurse Practitioner

## 2019-11-09 LAB — HIV ANTIBODY (ROUTINE TESTING W REFLEX): HIV 1&2 Ab, 4th Generation: NONREACTIVE

## 2019-11-09 LAB — HEPATITIS C ANTIBODY
Hepatitis C Ab: NONREACTIVE
SIGNAL TO CUT-OFF: 0.02 (ref <1.00)

## 2019-11-09 NOTE — Telephone Encounter (Signed)
I reassured her about her lab work and OK to take a MVI with additional folic acid and B vitamins.She had no allergic reaction to Vit D and will continue.

## 2019-11-15 ENCOUNTER — Ambulatory Visit (INDEPENDENT_AMBULATORY_CARE_PROVIDER_SITE_OTHER): Payer: Medicaid Other

## 2019-11-15 DIAGNOSIS — E538 Deficiency of other specified B group vitamins: Secondary | ICD-10-CM | POA: Diagnosis not present

## 2019-11-15 MED ORDER — CYANOCOBALAMIN 1000 MCG/ML IJ SOLN
1000.0000 ug | Freq: Once | INTRAMUSCULAR | Status: AC
Start: 2019-11-15 — End: 2019-11-15
  Administered 2019-11-15: 1000 ug via INTRAMUSCULAR

## 2019-11-15 NOTE — Progress Notes (Signed)
Patient presented for B 12 injection to right deltoid, patient voiced no concerns nor showed any signs of distress during injection. 

## 2019-11-20 ENCOUNTER — Telehealth: Payer: Medicaid Other | Admitting: Nurse Practitioner

## 2019-11-20 DIAGNOSIS — R112 Nausea with vomiting, unspecified: Secondary | ICD-10-CM | POA: Diagnosis not present

## 2019-11-20 DIAGNOSIS — R52 Pain, unspecified: Secondary | ICD-10-CM | POA: Diagnosis not present

## 2019-11-20 DIAGNOSIS — K591 Functional diarrhea: Secondary | ICD-10-CM | POA: Diagnosis not present

## 2019-11-20 DIAGNOSIS — R5081 Fever presenting with conditions classified elsewhere: Secondary | ICD-10-CM

## 2019-11-20 DIAGNOSIS — R05 Cough: Secondary | ICD-10-CM | POA: Diagnosis not present

## 2019-11-20 DIAGNOSIS — J029 Acute pharyngitis, unspecified: Secondary | ICD-10-CM

## 2019-11-20 DIAGNOSIS — R059 Cough, unspecified: Secondary | ICD-10-CM

## 2019-11-20 MED ORDER — BENZONATATE 100 MG PO CAPS
100.0000 mg | ORAL_CAPSULE | Freq: Three times a day (TID) | ORAL | 0 refills | Status: DC | PRN
Start: 2019-11-20 — End: 2019-12-25

## 2019-11-20 NOTE — Progress Notes (Signed)
E-Visit for Corona Virus Screening  Your current symptoms could be consistent with the coronavirus.  Many health care providers can now test patients at their office but not all are.  [place] has multiple testing sites. For information on our COVID testing locations and hours go to [email or web address]  * we must have test results before we can do any further treating.   Testing Information: The COVID-19 Community Testing sites will begin testing BY APPOINTMENT ONLY.  You can schedule online at [email or web address]  If you do not have access to a smart phone or computer you may call [phone] for an appointment. * also a lot of  Doctor offices now are doing covid testing. You will have to call and see if yours is dong that.  Additional testing sites in the Community:   For [facility] Testing sites in [place]  [email or web address]   For Pop-up testing sites in [place]  [email or web address]   For Testing sites with regular hours [email or web address]   For [facility] [email or web address]   For [facility] [email or web address]   For [facility] testing in [place] and [employer] [email or web address]   For [facility] testing in [place]   [email or web address]  For  more information about community testing call [phone]   Please quarantine yourself while awaiting your test results. Please stay home for a minimum of 10 days from the first day of illness with improving symptoms and you have had 24 hours of no fever (without the use of Tylenol (Acetaminophen) Motrin (Ibuprofen) or  any fever reducing medication).  Also - Do not get tested prior to returning to work because once you have had a positive test the test can stay positive for more than a month in some cases.   You should wear a mask or cloth face covering over your nose and mouth if you must be around other people or animals, including pets (even at home). Try to stay at least 6 feet away from other people. This will protect the people around you.  Please continue good preventive care measures, including:  frequent hand-washing, avoid touching your face, cover coughs/sneezes, stay out of crowds and keep a 6 foot distance from others.  COVID-19 is a respiratory illness with symptoms that are similar to the flu. Symptoms are typically mild to moderate, but there have been cases of severe illness and death due to the virus.   The following symptoms may appear 2-14 days after exposure:  Fever  Cough  Shortness of breath or difficulty breathing  Chills  Repeated shaking with chills  Muscle pain  Headache  Sore throat  New loss of taste or smell  Fatigue  Congestion or runny nose  Nausea or vomiting  Diarrhea  Go to the nearest hospital ED for assessment if fever/cough/breathlessness are severe or illness seems like a threat to life.  It is vitally important that if you feel that you have an infection such as this virus or any other virus that you stay home and away from places where you may spread it to others.  You should avoid contact with people age 37 and older.   You can use medication such as A prescription cough medication called Tessalon Perles 100 mg. You may take 1-2 capsules every 8 hours as needed for cough  You may also take acetaminophen (Tylenol) as needed for fever.  Reduce your risk of any infection by using the same precautions used for avoiding the common cold or flu:  Wash your hands often with soap and warm water for at least 20 seconds.  If soap and water are not readily  available, use an alcohol-based hand sanitizer with at least 60% alcohol.   If coughing or sneezing, cover your mouth and nose by coughing or sneezing into the elbow areas of your shirt or coat, into a tissue or into your sleeve (not your hands).  Avoid shaking hands with others and consider head nods or verbal greetings only.  Avoid touching your eyes, nose, or mouth with unwashed hands.   Avoid close contact with people who are sick.  Avoid places or events with large numbers of people in one location, like concerts or sporting events.  Carefully consider travel plans you have or are making.  If you are planning any travel outside or inside the [place], visit the CDC's Travelers' Health webpage for the latest health notices.  If you have some symptoms but not all symptoms, continue to monitor at home and seek medical attention if your symptoms worsen.  If you are having a medical emergency, call 911.  HOME CARE  Only take medications as instructed by your medical team.  Drink plenty of fluids and get plenty of rest.  A steam or ultrasonic humidifier can help if you have congestion.   GET HELP RIGHT AWAY IF YOU HAVE EMERGENCY WARNING SIGNS** FOR COVID-19. If you or someone is showing any of these signs seek emergency medical care immediately. Call 911 or proceed to your closest emergency facility if:  You develop worsening high fever.  Trouble breathing  Bluish lips or face  Persistent pain or pressure in the chest  New confusion  Inability to wake or stay awake  You cough up blood.  Your symptoms become more severe  **This list is not all possible symptoms. Contact your medical provider for any symptoms that are sever or concerning to you.  MAKE SURE YOU   Understand these instructions.  Will watch your condition.  Will get help right away if you are not doing well or get worse.  Your e-visit answers were reviewed by a board certified advanced clinical  practitioner to complete your personal care plan.  Depending on the condition, your plan could have included both over the counter or prescription medications.  If there is a problem please reply once you have received a response from your provider.  Your safety is important to [place].  If you have drug allergies check your prescription carefully.    You can use MyChart to ask questions about today's visit, request a non-urgent call back, or ask for a work or school excuse for 24 hours related to this e-Visit. If it has been greater than 24 hours you will need to follow up with your provider, or enter a new e-Visit to address those concerns. You will get an e-mail in the next two days asking about your experience.  I hope that your e-visit has been valuable and will speed your recovery. Thank you for using e-visits.   5-10 minutes spent reviewing and documenting in chart.

## 2019-11-21 ENCOUNTER — Encounter (INDEPENDENT_AMBULATORY_CARE_PROVIDER_SITE_OTHER): Payer: Self-pay

## 2019-11-21 DIAGNOSIS — Z03818 Encounter for observation for suspected exposure to other biological agents ruled out: Secondary | ICD-10-CM | POA: Diagnosis not present

## 2019-11-21 DIAGNOSIS — J029 Acute pharyngitis, unspecified: Secondary | ICD-10-CM | POA: Diagnosis not present

## 2019-11-22 ENCOUNTER — Ambulatory Visit: Payer: Medicaid Other

## 2019-11-24 ENCOUNTER — Telehealth: Payer: Medicaid Other | Admitting: Nurse Practitioner

## 2019-11-24 DIAGNOSIS — S025XXA Fracture of tooth (traumatic), initial encounter for closed fracture: Secondary | ICD-10-CM

## 2019-11-24 MED ORDER — CLINDAMYCIN HCL 300 MG PO CAPS
300.0000 mg | ORAL_CAPSULE | Freq: Four times a day (QID) | ORAL | 0 refills | Status: DC
Start: 2019-11-24 — End: 2019-12-25

## 2019-11-24 MED ORDER — NAPROXEN 500 MG PO TABS
500.0000 mg | ORAL_TABLET | Freq: Two times a day (BID) | ORAL | 0 refills | Status: DC
Start: 2019-11-24 — End: 2019-12-25

## 2019-11-24 NOTE — Progress Notes (Signed)
E-Visit for Dental Pain  We are sorry that you are not feeling well.  Here is how we plan to help!  Based on what you have shared with me in the questionnaire, it sounds like you have broken tooth  naprosyn 500mg  2 times per day for 7 days for discomfort and Clindamycin 300mg  4 times per day for days  It is imperative that you see a dentist within 10 days of this eVisit to determine the cause of the dental pain and be sure it is adequately treated  A toothache or tooth pain is caused when the nerve in the root of a tooth or surrounding a tooth is irritated. Dental (tooth) infection, decay, injury, or loss of a tooth are the most common causes of dental pain. Pain may also occur after an extraction (tooth is pulled out). Pain sometimes originates from other areas and radiates to the jaw, thus appearing to be tooth pain.Bacteria growing inside your mouth can contribute to gum disease and dental decay, both of which can cause pain. A toothache occurs from inflammation of the central portion of the tooth called pulp. The pulp contains nerve endings that are very sensitive to pain. Inflammation to the pulp or pulpitis may be caused by dental cavities, trauma, and infection.    HOME CARE:   For toothaches: . Over-the-counter pain medications such as acetaminophen or ibuprofen may be used. Take these as directed on the package while you arrange for a dental appointment. . Avoid very cold or hot foods, because they may make the pain worse. . You may get relief from biting on a cotton ball soaked in oil of cloves. You can get oil of cloves at most drug stores.  For jaw pain: .  Aspirin may be helpful for problems in the joint of the jaw in adults. . If pain happens every time you open your mouth widely, the temporomandibular joint (TMJ) may be the source of the pain. Yawning or taking a large bite of food may worsen the pain. An appointment with your doctor or dentist will help you find the cause.      GET HELP RIGHT AWAY IF:  . You have a high fever or chills . If you have had a recent head or face injury and develop headache, light headedness, nausea, vomiting, or other symptoms that concern you after an injury to your face or mouth, you could have a more serious injury in addition to your dental injury. . A facial rash associated with a toothache: This condition may improve with medication. Contact your doctor for them to decide what is appropriate. . Any jaw pain occurring with chest pain: Although jaw pain is most commonly caused by dental disease, it is sometimes referred pain from other areas. People with heart disease, especially people who have had stents placed, people with diabetes, or those who have had heart surgery may have jaw pain as a symptom of heart attack or angina. If your jaw or tooth pain is associated with lightheadedness, sweating, or shortness of breath, you should see a doctor as soon as possible. . Trouble swallowing or excessive pain or bleeding from gums: If you have a history of a weakened immune system, diabetes, or steroid use, you may be more susceptible to infections. Infections can often be more severe and extensive or caused by unusual organisms. Dental and gum infections in people with these conditions may require more aggressive treatment. An abscess may need draining or IV antibiotics, for example.  MAKE SURE YOU    Understand these instructions.  Will watch your condition.  Will get help right away if you are not doing well or get worse.  Thank you for choosing an e-visit. Your e-visit answers were reviewed by a board certified advanced clinical practitioner to complete your personal care plan. Depending upon the condition, your plan could have included both over the counter or prescription medications. Please review your pharmacy choice. Make sure the pharmacy is open so you can pick up prescription now. If there is a problem, you may contact your  provider through [employer] and have the prescription routed to another pharmacy. Your safety is important to [place]. If you have drug allergies check your prescription carefully.  For the next 24 hours you can use MyChart to ask questions about today's visit, request a non-urgent call back, or ask for a work or school excuse. You will get an email in the next two days asking about your experience. I hope that your e-visit has been valuable and will speed your recovery.  5-10 minutes spent reviewing and documenting in chart.

## 2019-11-26 ENCOUNTER — Other Ambulatory Visit: Payer: Self-pay

## 2019-11-26 ENCOUNTER — Encounter: Payer: Self-pay | Admitting: Emergency Medicine

## 2019-11-26 ENCOUNTER — Emergency Department: Payer: Medicaid Other

## 2019-11-26 ENCOUNTER — Emergency Department
Admission: EM | Admit: 2019-11-26 | Discharge: 2019-11-26 | Disposition: A | Discharge status: Home / Self Care | Payer: Medicaid Other | Attending: Emergency Medicine | Admitting: Emergency Medicine

## 2019-11-26 DIAGNOSIS — J329 Chronic sinusitis, unspecified: Secondary | ICD-10-CM | POA: Diagnosis not present

## 2019-11-26 DIAGNOSIS — J3489 Other specified disorders of nose and nasal sinuses: Secondary | ICD-10-CM | POA: Diagnosis not present

## 2019-11-26 DIAGNOSIS — K029 Dental caries, unspecified: Secondary | ICD-10-CM | POA: Diagnosis not present

## 2019-11-26 DIAGNOSIS — R000 Tachycardia, unspecified: Secondary | ICD-10-CM | POA: Diagnosis not present

## 2019-11-26 DIAGNOSIS — K0889 Other specified disorders of teeth and supporting structures: Secondary | ICD-10-CM | POA: Diagnosis not present

## 2019-11-26 DIAGNOSIS — Z79899 Other long term (current) drug therapy: Secondary | ICD-10-CM | POA: Diagnosis not present

## 2019-11-26 DIAGNOSIS — L03211 Cellulitis of face: Secondary | ICD-10-CM | POA: Insufficient documentation

## 2019-11-26 LAB — CBC WITH DIFFERENTIAL/PLATELET
Abs Immature Granulocytes: 0.02 10*3/uL (ref 0.00–0.07)
Basophils Absolute: 0 10*3/uL (ref 0.0–0.1)
Basophils Relative: 0 %
Eosinophils Absolute: 0 10*3/uL (ref 0.0–0.5)
Eosinophils Relative: 0 %
HCT: 39.5 % (ref 36.0–46.0)
Hemoglobin: 13.3 g/dL (ref 12.0–15.0)
Immature Granulocytes: 0 %
Lymphocytes Relative: 23 %
Lymphs Abs: 1.4 10*3/uL (ref 0.7–4.0)
MCH: 27.7 pg (ref 26.0–34.0)
MCHC: 33.7 g/dL (ref 30.0–36.0)
MCV: 82.3 fL (ref 80.0–100.0)
Monocytes Absolute: 0.4 10*3/uL (ref 0.1–1.0)
Monocytes Relative: 6 %
Neutro Abs: 4.4 10*3/uL (ref 1.7–7.7)
Neutrophils Relative %: 71 %
Platelets: 172 10*3/uL (ref 150–400)
RBC: 4.8 MIL/uL (ref 3.87–5.11)
RDW: 12.2 % (ref 11.5–15.5)
WBC: 6.2 10*3/uL (ref 4.0–10.5)
nRBC: 0 % (ref 0.0–0.2)

## 2019-11-26 LAB — BASIC METABOLIC PANEL
Anion gap: 9 (ref 5–15)
BUN: 6 mg/dL (ref 6–20)
CO2: 26 mmol/L (ref 22–32)
Calcium: 9 mg/dL (ref 8.9–10.3)
Chloride: 99 mmol/L (ref 98–111)
Creatinine, Ser: 0.98 mg/dL (ref 0.44–1.00)
GFR calc Af Amer: >60 mL/min (ref >60)
GFR calc non Af Amer: >60 mL/min (ref >60)
Glucose, Bld: 112 mg/dL — ABNORMAL HIGH (ref 70–99)
Potassium: 5.1 mmol/L (ref 3.5–5.1)
Sodium: 134 mmol/L — ABNORMAL LOW (ref 135–145)

## 2019-11-26 LAB — POCT PREGNANCY, URINE: Preg Test, Ur: NEGATIVE

## 2019-11-26 MED ORDER — KETOROLAC TROMETHAMINE 30 MG/ML IJ SOLN
30.0000 mg | Freq: Once | INTRAMUSCULAR | Status: AC
  Administered 2019-11-26: 30 mg via INTRAVENOUS
  Filled 2019-11-26: qty 1

## 2019-11-26 MED ORDER — IOHEXOL 300 MG/ML  SOLN
75.0000 mL | Freq: Once | INTRAMUSCULAR | Status: AC | PRN
  Administered 2019-11-26: 75 mL via INTRAVENOUS
  Filled 2019-11-26: qty 75

## 2019-11-26 MED ORDER — KETOROLAC TROMETHAMINE 30 MG/ML IJ SOLN: 30.0000 mg | Freq: Once | INTRAMUSCULAR | Status: DC

## 2019-11-26 MED ORDER — ACETAMINOPHEN 500 MG PO TABS
1000.0000 mg | ORAL_TABLET | Freq: Once | ORAL | Status: AC
  Administered 2019-11-26: 1000 mg via ORAL
  Filled 2019-11-26: qty 2

## 2019-11-26 NOTE — ED Provider Notes (Signed)
[facility] Emergency Department Provider Note  ____________________________________________   First MD Initiated Contact with Patient 11/26/19 1505     (approximate)  I have reviewed the triage vital signs and the nursing notes.   HISTORY  Chief Complaint Dental Pain   HPI Caelie M Fein is a 39 y.o. female with a past medical history of anxiety, gallstones, depression, and obesity who presents for assessment of persistent pain and swelling in her right lower jaw that has been on and off but getting worse over the last 4 days.  Patient notes she did do a televisit with her PCP on 9/11 and was prescribed clindamycin which he has been taking as well as told to take Naprosyn which she has only been intermittently taking but feels neither of these medications are helping.  She does not have a dental appointment currently scheduled but does have a dentist.  She endorses subjective fevers but denies any chills, vomiting, difficulty swallowing, chest pain, cough, shortness of breath, back pain, dental pain, diarrhea, dysuria, rash, extremity pain, or other acute complaints.  No other alleviating or aggravating factors.  No prior similar episodes.  Patient has otherwise not seen a dentist in a long time.  She denies tobacco abuse or EtOH abuse.         Past Medical History:  Diagnosis Date  . Anxiety   . Chicken pox   . Gallstones   . UTI (urinary tract infection) 07-20-15    Patient Active Problem List   Diagnosis Date Noted  . Anxiety and depression 11/08/2019  . Encounter for preventative adult health care examination 11/08/2019  . Situational anxiety 11/01/2019  . Depression 11/01/2019  . Obesity, morbid, BMI 40.0-49.9 (HCC) 11/01/2019  . Appendicitis 09/04/2017  . Acute appendicitis   . Chronic cholecystitis with calculus   . Cholelithiasis 07/22/2015    Past Surgical History:  Procedure Laterality Date  . APPENDECTOMY    . CESAREAN SECTION   12/27/2011  . CESAREAN SECTION  12/31/2013  . CHOLECYSTECTOMY N/A 09/02/2015   Procedure: LAPAROSCOPIC CHOLECYSTECTOMY WITH INTRAOPERATIVE CHOLANGIOGRAM;  Surgeon: Gladis Riffle, MD;  Location: [facility] ORS;  Service: General;  Laterality: N/A;  . LAPAROSCOPIC APPENDECTOMY N/A 09/04/2017   Procedure: APPENDECTOMY LAPAROSCOPIC;  Surgeon: Leafy Ro, MD;  Location: [facility] ORS;  Service: General;  Laterality: N/A;    Prior to Admission medications   Medication Sig Start Date End Date Taking? Authorizing Provider  benzonatate (TESSALON PERLES) 100 MG capsule Take 1 capsule (100 mg total) by mouth 3 (three) times daily as needed for cough. 11/20/19   Daphine Deutscher, Mary-Margaret, FNP  clindamycin (CLEOCIN) 300 MG capsule Take 1 capsule (300 mg total) by mouth 4 (four) times daily. 11/24/19   Daphine Deutscher Mary-Margaret, FNP  cyanocobalamin (,VITAMIN B-12,) 1000 MCG/ML injection 1000 mcg (1 mg) injection once per week for four weeks, followed by 1000 mcg injection once per month. 11/08/19   Theadore Nan, NP  naproxen (NAPROSYN) 500 MG tablet Take 1 tablet (500 mg total) by mouth 2 (two) times daily with a meal. 11/24/19   Daphine Deutscher, Mary-Margaret, FNP  Vitamin D, Ergocalciferol, (DRISDOL) 1.25 MG (50000 UNIT) CAPS capsule Take 1 capsule (50,000 Units total) by mouth every 7 (seven) days. 11/08/19   Theadore Nan, NP  albuterol (PROVENTIL HFA;VENTOLIN HFA) 108 (90 Base) MCG/ACT inhaler Inhale 2 puffs into the lungs every 6 (six) hours as needed for wheezing or shortness of breath. Patient not taking: Reported on 07/10/2018 10/03/17 09/19/18  Don Perking,  [place], MD  fluticasone (FLONASE) 50 MCG/ACT nasal spray Place 1 spray into both nostrils daily. 10/08/18 01/30/19  Linus Mako B, NP  ipratropium (ATROVENT) 0.06 % nasal spray Place 2 sprays into both nostrils 4 (four) times daily. Patient not taking: Reported on 07/10/2018 07/09/18 09/19/18  Ofilia Neas, PA-C    Allergies Flagyl [metronidazole] and Sulfa  antibiotics  Family History  Problem Relation Age of Onset  . Cancer Maternal Grandmother        lung  . Emphysema Maternal Grandmother   . Depression Maternal Grandmother   . Heart disease Maternal Grandmother   . Hyperlipidemia Maternal Grandmother   . Hypertension Maternal Grandmother   . Stroke Maternal Grandmother   . Cancer Maternal Grandfather        pancreatic  . Hypertension Mother   . Depression Mother   . Hyperlipidemia Mother   . Asthma Son   . Cancer Paternal Grandmother   . Cancer Paternal Grandfather   . Heart disease Paternal Grandfather   . Alcohol abuse Neg Hx   . Arthritis Neg Hx   . Birth defects Neg Hx   . COPD Neg Hx   . Diabetes Neg Hx   . Drug abuse Neg Hx   . Early death Neg Hx   . Hearing loss Neg Hx   . Kidney disease Neg Hx   . Learning disabilities Neg Hx   . Mental illness Neg Hx   . Mental retardation Neg Hx   . Miscarriages / Stillbirths Neg Hx   . Vision loss Neg Hx   . Varicose Veins Neg Hx     Social History Social History   Tobacco Use  . Smoking status: Never Smoker  . Smokeless tobacco: Never Used  Vaping Use  . Vaping Use: Never used  Substance Use Topics  . Alcohol use: No  . Drug use: No    Review of Systems  Review of Systems  Constitutional: Negative for chills and fever.  HENT: Negative for sore throat.   Eyes: Negative for pain.  Respiratory: Negative for cough and stridor.   Cardiovascular: Negative for chest pain.  Gastrointestinal: Negative for vomiting.  Genitourinary: Negative for dysuria.  Musculoskeletal: Negative for myalgias.  Skin: Negative for rash.  Neurological: Negative for seizures, loss of consciousness and headaches.  Psychiatric/Behavioral: Negative for suicidal ideas.  All other systems reviewed and are negative.     ____________________________________________   PHYSICAL EXAM:  VITAL SIGNS: ED Triage Vitals  Enc Vitals Group     BP 11/26/19 1241 (!) 129/97     Pulse Rate  11/26/19 1241 (!) 107     Resp 11/26/19 1241 16     Temp 11/26/19 1241 99.2 F (37.3 C)     Temp Source 11/26/19 1241 Oral     SpO2 11/26/19 1241 98 %     Weight 11/26/19 1239 220 lb (99.8 kg)     Height 11/26/19 1239 5\' 2"  (1.575 m)     Head Circumference --      Peak Flow --      Pain Score 11/26/19 1239 6     Pain Loc --      Pain Edu? --      Excl. in GC? --    Vitals:   11/26/19 1241  BP: (!) 129/97  Pulse: (!) 107  Resp: 16  Temp: 99.2 F (37.3 C)  SpO2: 98%   Physical Exam Vitals and nursing note reviewed.  Constitutional:  General: She is not in acute distress.    Appearance: She is well-developed.  HENT:     Head: Normocephalic and atraumatic.     Right Ear: External ear normal.     Left Ear: External ear normal.     Nose: Nose normal.     Mouth/Throat:     Mouth: Mucous membranes are moist.     Dentition: Does not have dentures. Dental caries present.     Palate: No mass and lesions.  Eyes:     Conjunctiva/sclera: Conjunctivae normal.  Cardiovascular:     Rate and Rhythm: Regular rhythm. Tachycardia present.     Heart sounds: No murmur heard.   Pulmonary:     Effort: Pulmonary effort is normal. No respiratory distress.     Breath sounds: Normal breath sounds.  Abdominal:     Palpations: Abdomen is soft.     Tenderness: There is no abdominal tenderness.  Musculoskeletal:     Cervical back: Neck supple.  Skin:    General: Skin is warm and dry.  Neurological:     Mental Status: She is alert and oriented to person, place, and time.  Psychiatric:        Mood and Affect: Mood normal.     Patient has diffuse caries.  There is significant erythema edema and tenderness over the right lower buccal gingiva without clear focal fluctuance.  There is erythema tenderness and edema of the right buccal tissue.  Cranial nerves II through XII grossly intact.  Patient has full range of motion of her neck.  There is no tenderness from the right ear.  Oropharynx is  otherwise unremarkable.  No tenderness or induration under the tongue or under the mandible. ____________________________________________   LABS (all labs ordered are listed, but only abnormal results are displayed)  Labs Reviewed  BASIC METABOLIC PANEL - Abnormal; Notable for the following components:      Result Value   Sodium 134 (*)    Glucose, Bld 112 (*)    All other components within normal limits  CBC WITH DIFFERENTIAL/PLATELET  POC URINE PREG, ED  POCT PREGNANCY, URINE   ____________________________________________  ____________________________________________   PROCEDURES  Procedure(s) performed (including Critical Care):  Procedures   ____________________________________________   INITIAL IMPRESSION / ASSESSMENT AND PLAN / ED COURSE        Patient presents with above history and exam for assessment of worsening pain and swelling of the right cheek and right posterior gum the setting of diffuse caries and currently being on clindamycin concern for dental abscess.  Patient is tachycardic at 107 with otherwise stable vital signs on arrival.  Exam as above.  Overall patient's exam and work-up is concerning for likely odontogenic cellulitis as noted above CT.  No evidence of abscess or other deep space infection in the head or neck.  Patient has no evidence of trismus and she is tolerating her secretions is no difficulty swallowing or airway concerns at this time.  I do believe she is an appropriate biotics of clindamycin at this time.  Emphasized importance of continuing to take this as prescribed.  On reassessment after below noted analgesia patient states she felt much better.  I counseled patient on appropriate timing and dosage of Tylenol and NSAIDs and emphasized importance of close outpatient dental follow-up.  I also emphasized strict return precautions.  Patient was discharged stable condition.  ____________________________________________   FINAL CLINICAL  IMPRESSION(S) / ED DIAGNOSES  Final diagnoses:  Cellulitis of face  Dental  caries    Medications  acetaminophen (TYLENOL) tablet 1,000 mg (1,000 mg Oral Given 11/26/19 1632)  ketorolac (TORADOL) 30 MG/ML injection 30 mg (30 mg Intravenous Given 11/26/19 1632)  iohexol (OMNIPAQUE) 300 MG/ML solution 75 mL (75 mLs Intravenous Contrast Given 11/26/19 1704)     ED Discharge Orders    None       Note:  This document was prepared using Dragon voice recognition software and may include unintentional dictation errors.   Gilles ChiquitoSmith, Yulian Gosney P, MD 11/26/19 1745

## 2019-11-26 NOTE — ED Triage Notes (Signed)
Pt states that she did an e-visit and was put on an abx 3-4 days ago as well with no relief

## 2019-11-26 NOTE — ED Triage Notes (Signed)
Pt reports toothache to the right lower jaw for 3-4 days

## 2019-11-27 ENCOUNTER — Ambulatory Visit (INDEPENDENT_AMBULATORY_CARE_PROVIDER_SITE_OTHER): Payer: Self-pay | Admitting: Family Medicine

## 2019-11-29 ENCOUNTER — Ambulatory Visit: Payer: Medicaid Other

## 2019-12-06 ENCOUNTER — Ambulatory Visit: Payer: Medicaid Other

## 2019-12-11 ENCOUNTER — Ambulatory Visit (INDEPENDENT_AMBULATORY_CARE_PROVIDER_SITE_OTHER): Payer: Self-pay | Admitting: Family Medicine

## 2019-12-12 DIAGNOSIS — J209 Acute bronchitis, unspecified: Secondary | ICD-10-CM | POA: Diagnosis not present

## 2019-12-12 DIAGNOSIS — Z8616 Personal history of COVID-19: Secondary | ICD-10-CM | POA: Diagnosis not present

## 2019-12-12 DIAGNOSIS — B9689 Other specified bacterial agents as the cause of diseases classified elsewhere: Secondary | ICD-10-CM | POA: Diagnosis not present

## 2019-12-12 DIAGNOSIS — J019 Acute sinusitis, unspecified: Secondary | ICD-10-CM | POA: Diagnosis not present

## 2019-12-24 ENCOUNTER — Telehealth: Payer: Self-pay

## 2019-12-24 NOTE — Telephone Encounter (Signed)
Left message for patient to return call back. Also instructed to check her mychart to receive the message.

## 2019-12-25 ENCOUNTER — Encounter: Payer: Self-pay | Admitting: Nurse Practitioner

## 2019-12-25 ENCOUNTER — Ambulatory Visit: Payer: Medicaid Other | Admitting: Obstetrics and Gynecology

## 2019-12-25 ENCOUNTER — Other Ambulatory Visit: Payer: Self-pay

## 2019-12-25 ENCOUNTER — Ambulatory Visit (INDEPENDENT_AMBULATORY_CARE_PROVIDER_SITE_OTHER): Payer: Medicaid Other | Admitting: Nurse Practitioner

## 2019-12-25 VITALS — BP 128/80 | HR 92 | Temp 98.5°F | Ht 62.0 in | Wt 216.0 lb

## 2019-12-25 DIAGNOSIS — R141 Gas pain: Secondary | ICD-10-CM | POA: Diagnosis not present

## 2019-12-25 DIAGNOSIS — R0789 Other chest pain: Secondary | ICD-10-CM | POA: Insufficient documentation

## 2019-12-25 HISTORY — DX: Gas pain: R14.1

## 2019-12-25 MED ORDER — BENZONATATE 100 MG PO CAPS: 200.0000 mg | ORAL_CAPSULE | Freq: Three times a day (TID) | ORAL | 0 refills | Status: DC | PRN

## 2019-12-25 NOTE — Patient Instructions (Addendum)
You may resume your B12 injections as planned.  Call the clinic to set up vitamin B12 injection at your convenience.  You may resume vitamin D3  weekly and complete that course over the next 8 weeks.  Once you run out of the vitamin D 50,000 IU weekly you will stop that medication.  Purchase vitamin D3 1000 IU over-the-counter and take 1 every day to follow.  For your gas symptoms, purchase Pepcid AC over-the-counter and take 1 of those every day along with Tums as needed.  For your chest tenderness you may take Tylenol as directed on the bottle.  Call back your GYN to make sure you get your Pap test done.  You can get your Covid vaccine a few months after the illness at your pharmacy.   You can get the flu vaccine any time you are feeling well - when you come in for next B12 shot.     Abdominal Bloating When you have abdominal bloating, your abdomen may feel full, tight, or painful. It may also look bigger than normal or swollen (distended). Common causes of abdominal bloating include:  Swallowing air.  Constipation.  Problems digesting food.  Eating too much.  Irritable bowel syndrome. This is a condition that affects the large intestine.  Lactose intolerance. This is an inability to digest lactose, a natural sugar in dairy products.  Celiac disease. This is a condition that affects the ability to digest gluten, a protein found in some grains.  Gastroparesis. This is a condition that slows down the movement of food in the stomach and small intestine. It is more common in people with diabetes mellitus.  Gastroesophageal reflux disease (GERD). This is a digestive condition that makes stomach acid flow back into the esophagus.  Urinary retention. This means that the body is holding onto urine, and the bladder cannot be emptied all the way. Follow these instructions at home: Eating and drinking  Avoid eating too much.  Try not to swallow air while talking or eating.  Avoid  eating while lying down.  Avoid these foods and drinks: ? Foods that cause gas, such as broccoli, cabbage, cauliflower, and baked beans. ? Carbonated drinks. ? Hard candy. ? Chewing gum. Medicines  Take over-the-counter and prescription medicines only as told by your health care provider.  Take probiotic medicines. These medicines contain live bacteria or yeasts that can help digestion.  Take coated peppermint oil capsules. Activity  Try to exercise regularly. Exercise may help to relieve bloating that is caused by gas and relieve constipation. General instructions  Keep all follow-up visits as told by your health care provider. This is important. Contact a health care provider if:  You have nausea and vomiting.  You have diarrhea.  You have abdominal pain.  You have unusual weight loss or weight gain.  You have severe pain, and medicines do not help. Get help right away if:  You have severe chest pain.  You have trouble breathing.  You have shortness of breath.  You have trouble urinating.  You have darker urine than normal.  You have blood in your stools or have dark, tarry stools. Summary  Abdominal bloating means that the abdomen is swollen.  Common causes of abdominal bloating are swallowing air, constipation, and problems digesting food.  Avoid eating too much and avoid swallowing air.  Avoid foods that cause gas, carbonated drinks, hard candy, and chewing gum. This information is not intended to replace advice given to you by your health care  provider. Make sure you discuss any questions you have with your health care provider. Document Revised: 06/19/2018 Document Reviewed: 04/02/2016 Elsevier Patient Education  2020 [employer] Inc[employer]  Chest Wall Pain Chest wall pain is pain in or around the bones and muscles of your chest. Sometimes, an injury causes this pain. Excessive coughing or overuse of arm and chest muscles may also cause chest wall pain.  Sometimes, the cause may not be known. This pain may take several weeks or longer to get better. Follow these instructions at home: Managing pain, stiffness, and swelling   If directed, put ice on the painful area: ? Put ice in a plastic bag. ? Place a towel between your skin and the bag. ? Leave the ice on for 20 minutes, 2-3 times per day. Activity  Rest as told by your health care provider.  Avoid activities that cause pain. These include any activities that use your chest muscles or your abdominal and side muscles to lift heavy items. Ask your health care provider what activities are safe for you. General instructions   Take over-the-counter and prescription medicines only as told by your health care provider.  Do not use any products that contain nicotine or tobacco, such as cigarettes, e-cigarettes, and chewing tobacco. These can delay healing after injury. If you need help quitting, ask your health care provider.  Keep all follow-up visits as told by your health care provider. This is important. Contact a health care provider if:  You have a fever.  Your chest pain becomes worse.  You have new symptoms. Get help right away if:  You have nausea or vomiting.  You feel sweaty or light-headed.  You have a cough with mucus from your lungs (sputum) or you cough up blood.  You develop shortness of breath. These symptoms may represent a serious problem that is an emergency. Do not wait to see if the symptoms will go away. Get medical help right away. Call your local emergency services (911 in the [place] Do not drive yourself to the hospital. Summary  Chest wall pain is pain in or around the bones and muscles of your chest.  Depending on the cause, it may be treated with ice, rest, medicines, and avoiding activities that cause pain.  Contact a health care provider if you have a fever, worsening chest pain, or new symptoms.  Get help right away if you feel light-headed or  you develop shortness of breath. These symptoms may be an emergency. This information is not intended to replace advice given to you by your health care provider. Make sure you discuss any questions you have with your health care provider. Document Revised: 09/01/2017 Document Reviewed: 09/01/2017 Elsevier Patient Education  2020 [employer] Inc.  Nonspecific Chest Pain, Adult Chest pain can be caused by many different conditions. It can be caused by a condition that is life-threatening and requires treatment right away. It can also be caused by something that is not life-threatening. If you have chest pain, it can be hard to know the difference, so it is important to get help right away to make sure that you do not have a serious condition. Some life-threatening causes of chest pain include:  Heart attack.  A tear in the body's main blood vessel (aortic dissection).  Inflammation around your heart (pericarditis).  A problem in the lungs, such as a blood clot (pulmonary embolism) or a collapsed lung (pneumothorax). Some non life-threatening causes of chest pain include:  Heartburn.  Anxiety or stress.  Damage to the bones, muscles, and cartilage that make up your chest wall.  Pneumonia or bronchitis.  Shingles infection (varicella-zoster virus). Chest pain can feel like:  Pain or discomfort on the surface of your chest or deep in your chest.  Crushing, pressure, aching, or squeezing pain.  Burning or tingling.  Dull or sharp pain that is worse when you move, cough, or take a deep breath.  Pain or discomfort that is also felt in your back, neck, jaw, shoulder, or arm, or pain that spreads to any of these areas. Your chest pain may come and go. It may also be constant. Your health care provider will do lab tests and other studies to find the cause of your pain. Treatment will depend on the cause of your chest pain. Follow these instructions at home: Medicines  Take  over-the-counter and prescription medicines only as told by your health care provider.  If you were prescribed an antibiotic, take it as told by your health care provider. Do not stop taking the antibiotic even if you start to feel better. Lifestyle   Rest as directed by your health care provider.  Do not use any products that contain nicotine or tobacco, such as cigarettes and e-cigarettes. If you need help quitting, ask your health care provider.  Do not drink alcohol.  Make healthy lifestyle choices as recommended. These may include: ? Getting regular exercise. Ask your health care provider to suggest some activities that are safe for you. ? Eating a heart-healthy diet. This includes plenty of fresh fruits and vegetables, whole grains, low-fat (lean) protein, and low-fat dairy products. A dietitian can help you find healthy eating options. ? Maintaining a healthy weight. ? Managing any other health conditions you have, such as high blood pressure (hypertension) or diabetes. ? Reducing stress, such as with yoga or relaxation techniques. General instructions  Pay attention to any changes in your symptoms. Tell your health care provider about them or any new symptoms.  Avoid any activities that cause chest pain.  Keep all follow-up visits as told by your health care provider. This is important. This includes visits for any further testing if your chest pain does not go away. Contact a health care provider if:  Your chest pain does not go away.  You feel depressed.  You have a fever. Get help right away if:  Your chest pain gets worse.  You have a cough that gets worse, or you cough up blood.  You have severe pain in your abdomen.  You faint.  You have sudden, unexplained chest discomfort.  You have sudden, unexplained discomfort in your arms, back, neck, or jaw.  You have shortness of breath at any time.  You suddenly start to sweat, or your skin gets clammy.  You  feel nausea or you vomit.  You suddenly feel lightheaded or dizzy.  You have severe weakness, or unexplained weakness or fatigue.  Your heart begins to beat quickly, or it feels like it is skipping beats. These symptoms may represent a serious problem that is an emergency. Do not wait to see if the symptoms will go away. Get medical help right away. Call your local emergency services (911 in the [place] Do not drive yourself to the hospital. Summary  Chest pain can be caused by a condition that is serious and requires urgent treatment. It may also be caused by something that is not life-threatening.  If you have chest pain, it is very important to see your health  care provider. Your health care provider may do lab tests and other studies to find the cause of your pain.  Follow your health care provider's instructions on taking medicines, making lifestyle changes, and getting emergency treatment if symptoms become worse.  Keep all follow-up visits as told by your health care provider. This includes visits for any further testing if your chest pain does not go away. This information is not intended to replace advice given to you by your health care provider. Make sure you discuss any questions you have with your health care provider. Document Revised: 09/01/2017 Document Reviewed: 09/01/2017 Elsevier Patient Education  2020 [employer]

## 2019-12-25 NOTE — Progress Notes (Deleted)
PCP:  Theadore Nan, NP   No chief complaint on file.    HPI:      Tammy Frost is a 39 y.o. G1P0 whose LMP was No LMP recorded., presents today for her NP annual examination.  Her menses are {norm/abn:715}, lasting {number:22536} days.  Dysmenorrhea {dysmen:716}. She {does:18564} have intermenstrual bleeding.  Sex activity: {sex active:315163}.  Last Pap: {KAJG:[id number]}  Results were: {norm/abn:16707::"no abnormalities"} /neg HPV DNA *** Hx of STDs: {STD hx:14358}  There is no FH of breast cancer. There is no FH of ovarian cancer. The patient {does:18564} do self-breast exams.  Tobacco use: {tob:20664} Alcohol use: {Alcohol:11675} No drug use.  Exercise: {exercise:31265}  She {does:18564} get adequate calcium and Vitamin D in her diet.   The pregnancy intention screening data noted above was reviewed. Potential methods of contraception were discussed. The patient elected to proceed with {Upstream End Methods:24109}.     Past Medical History:  Diagnosis Date  . Anxiety   . Chicken pox   . Gallstones   . UTI (urinary tract infection) 07-20-15    Past Surgical History:  Procedure Laterality Date  . APPENDECTOMY    . CESAREAN SECTION  12/27/2011  . CESAREAN SECTION  12/31/2013  . CHOLECYSTECTOMY N/A 09/02/2015   Procedure: LAPAROSCOPIC CHOLECYSTECTOMY WITH INTRAOPERATIVE CHOLANGIOGRAM;  Surgeon: Gladis Riffle, MD;  Location: [facility];  Service: General;  Laterality: N/A;  . LAPAROSCOPIC APPENDECTOMY N/A 09/04/2017   Procedure: APPENDECTOMY LAPAROSCOPIC;  Surgeon: Leafy Ro, MD;  Location: [facility];  Service: General;  Laterality: N/A;    Family History  Problem Relation Age of Onset  . Cancer Maternal Grandmother        lung  . Emphysema Maternal Grandmother   . Depression Maternal Grandmother   . Heart disease Maternal Grandmother   . Hyperlipidemia Maternal Grandmother   . Hypertension Maternal Grandmother   . Stroke Maternal Grandmother     . Cancer Maternal Grandfather        pancreatic  . Hypertension Mother   . Depression Mother   . Hyperlipidemia Mother   . Asthma Son   . Cancer Paternal Grandmother   . Cancer Paternal Grandfather   . Heart disease Paternal Grandfather   . Alcohol abuse Neg Hx   . Arthritis Neg Hx   . Birth defects Neg Hx   . COPD Neg Hx   . Diabetes Neg Hx   . Drug abuse Neg Hx   . Early death Neg Hx   . Hearing loss Neg Hx   . Kidney disease Neg Hx   . Learning disabilities Neg Hx   . Mental illness Neg Hx   . Mental retardation Neg Hx   . Miscarriages / Stillbirths Neg Hx   . Vision loss Neg Hx   . Varicose Veins Neg Hx     Social History   Socioeconomic History  . Marital status: Single    Spouse name: Not on file  . Number of children: Not on file  . Years of education: Not on file  . Highest education level: Not on file  Occupational History  . Occupation: unemployed  Tobacco Use  . Smoking status: Never Smoker  . Smokeless tobacco: Never Used  Vaping Use  . Vaping Use: Never used  Substance and Sexual Activity  . Alcohol use: No  . Drug use: No  . Sexual activity: Yes    Birth control/protection: None  Other Topics Concern  . Not on file  Social History Narrative   Single has  boyfriend and lives with mother Has 2 boys    Social Determinants of Health   Financial Resource Strain:   . Difficulty of Paying Living Expenses: Not on file  Food Insecurity:   . Worried About Programme researcher, broadcasting/film/video in the Last Year: Not on file  . Ran Out of Food in the Last Year: Not on file  Transportation Needs:   . Lack of Transportation (Medical): Not on file  . Lack of Transportation (Non-Medical): Not on file  Physical Activity:   . Days of Exercise per Week: Not on file  . Minutes of Exercise per Session: Not on file  Stress:   . Feeling of Stress : Not on file  Social Connections:   . Frequency of Communication with Friends and Family: Not on file  . Frequency of Social  Gatherings with Friends and Family: Not on file  . Attends Religious Services: Not on file  . Active Member of Clubs or Organizations: Not on file  . Attends Banker Meetings: Not on file  . Marital Status: Not on file  Intimate Partner Violence:   . Fear of Current or Ex-Partner: Not on file  . Emotionally Abused: Not on file  . Physically Abused: Not on file  . Sexually Abused: Not on file     Current Outpatient Medications:  .  benzonatate (TESSALON PERLES) 100 MG capsule, Take 1 capsule (100 mg total) by mouth 3 (three) times daily as needed for cough., Disp: 20 capsule, Rfl: 0 .  clindamycin (CLEOCIN) 300 MG capsule, Take 1 capsule (300 mg total) by mouth 4 (four) times daily., Disp: 40 capsule, Rfl: 0 .  cyanocobalamin (,VITAMIN B-12,) 1000 MCG/ML injection, 1000 mcg (1 mg) injection once per week for four weeks, followed by 1000 mcg injection once per month., Disp: 1 mL, Rfl: 15 .  naproxen (NAPROSYN) 500 MG tablet, Take 1 tablet (500 mg total) by mouth 2 (two) times daily with a meal., Disp: 40 tablet, Rfl: 0 .  Vitamin D, Ergocalciferol, (DRISDOL) 1.25 MG (50000 UNIT) CAPS capsule, Take 1 capsule (50,000 Units total) by mouth every 7 (seven) days., Disp: 8 capsule, Rfl: 0     ROS:  Review of Systems BREAST: No symptoms   Objective: There were no vitals taken for this visit.   OBGyn Exam  Results: No results found for this or any previous visit (from the past 24 hour(s)).  Assessment/Plan: No diagnosis found.  No orders of the defined types were placed in this encounter.            GYN counsel {counseling:16159}     F/U  No follow-ups on file.  Eban Weick B. Yaretzy Olazabal, PA-C 12/25/2019 8:13 AM

## 2019-12-25 NOTE — Progress Notes (Signed)
Established Patient Office Visit  Subjective:  Patient ID: Tammy Frost, female    DOB: November 07, 1980  Age: 39 y.o. MRN: [id number]  CC:  Chief Complaint  Patient presents with  . Acute Visit    gas/discomfort    HPI Tammy Frost is a 39 year old who reports 5-day history of gas type pain that starts in her abdomen and moves up into her left shoulder.  She has taken no medication for this but in the past she has used Tums. Moving around and belching helped in the past, but not helping now. She gets heartburn with spicy foods.  She has not tried to take any medicine for it.  No exertional chest pain, pressure heaviness or tightness.  No nausea vomiting, or bloating.  Bowel habits are normal without constipation.  She has had a cholecystectomy for gallstones and appendectomy in the past.  She did have a tooth infection last month and was prescribed clindamycin and Naprosyn.  She has a dental appointment next Thursday.   Patient stopped her B12 injections and vitamin D during this medication and wants to know when to restart.   Past Medical History:  Diagnosis Date  . Anxiety   . Chicken pox   . Gallstones   . UTI (urinary tract infection) 07-20-15    Past Surgical History:  Procedure Laterality Date  . APPENDECTOMY    . CESAREAN SECTION  12/27/2011  . CESAREAN SECTION  12/31/2013  . CHOLECYSTECTOMY N/A 09/02/2015   Procedure: LAPAROSCOPIC CHOLECYSTECTOMY WITH INTRAOPERATIVE CHOLANGIOGRAM;  Surgeon: Gladis Riffleatherine L Loflin, MD;  Location: [facility];  Service: General;  Laterality: N/A;  . LAPAROSCOPIC APPENDECTOMY N/A 09/04/2017   Procedure: APPENDECTOMY LAPAROSCOPIC;  Surgeon: Leafy RoPabon, Diego F, MD;  Location: [facility];  Service: General;  Laterality: N/A;    Family History  Problem Relation Age of Onset  . Cancer Maternal Grandmother        lung  . Emphysema Maternal Grandmother   . Depression Maternal Grandmother   . Heart disease Maternal Grandmother   . Hyperlipidemia Maternal  Grandmother   . Hypertension Maternal Grandmother   . Stroke Maternal Grandmother   . Cancer Maternal Grandfather        pancreatic  . Hypertension Mother   . Depression Mother   . Hyperlipidemia Mother   . Asthma Son   . Cancer Paternal Grandmother   . Cancer Paternal Grandfather   . Heart disease Paternal Grandfather   . Alcohol abuse Neg Hx   . Arthritis Neg Hx   . Birth defects Neg Hx   . COPD Neg Hx   . Diabetes Neg Hx   . Drug abuse Neg Hx   . Early death Neg Hx   . Hearing loss Neg Hx   . Kidney disease Neg Hx   . Learning disabilities Neg Hx   . Mental illness Neg Hx   . Mental retardation Neg Hx   . Miscarriages / Stillbirths Neg Hx   . Vision loss Neg Hx   . Varicose Veins Neg Hx     Social History   Socioeconomic History  . Marital status: Single    Spouse name: Not on file  . Number of children: Not on file  . Years of education: Not on file  . Highest education level: Not on file  Occupational History  . Occupation: unemployed  Tobacco Use  . Smoking status: Never Smoker  . Smokeless tobacco: Never Used  Vaping Use  . Vaping Use: Never used  Substance and Sexual Activity  . Alcohol use: No  . Drug use: No  . Sexual activity: Yes    Birth control/protection: None  Other Topics Concern  . Not on file  Social History Narrative   Single has  boyfriend and lives with mother Has 2 boys    Social Determinants of Health   Financial Resource Strain:   . Difficulty of Paying Living Expenses: Not on file  Food Insecurity:   . Worried About Programme researcher, broadcasting/film/video in the Last Year: Not on file  . Ran Out of Food in the Last Year: Not on file  Transportation Needs:   . Lack of Transportation (Medical): Not on file  . Lack of Transportation (Non-Medical): Not on file  Physical Activity:   . Days of Exercise per Week: Not on file  . Minutes of Exercise per Session: Not on file  Stress:   . Feeling of Stress : Not on file  Social Connections:   .  Frequency of Communication with Friends and Family: Not on file  . Frequency of Social Gatherings with Friends and Family: Not on file  . Attends Religious Services: Not on file  . Active Member of Clubs or Organizations: Not on file  . Attends Banker Meetings: Not on file  . Marital Status: Not on file  Intimate Partner Violence:   . Fear of Current or Ex-Partner: Not on file  . Emotionally Abused: Not on file  . Physically Abused: Not on file  . Sexually Abused: Not on file    Outpatient Medications Prior to Visit  Medication Sig Dispense Refill  . cyanocobalamin (,VITAMIN B-12,) 1000 MCG/ML injection 1000 mcg (1 mg) injection once per week for four weeks, followed by 1000 mcg injection once per month. (Patient not taking: Reported on 12/25/2019) 1 mL 15  . Vitamin D, Ergocalciferol, (DRISDOL) 1.25 MG (50000 UNIT) CAPS capsule Take 1 capsule (50,000 Units total) by mouth every 7 (seven) days. (Patient not taking: Reported on 12/25/2019) 8 capsule 0  . benzonatate (TESSALON PERLES) 100 MG capsule Take 1 capsule (100 mg total) by mouth 3 (three) times daily as needed for cough. 20 capsule 0  . clindamycin (CLEOCIN) 300 MG capsule Take 1 capsule (300 mg total) by mouth 4 (four) times daily. 40 capsule 0  . naproxen (NAPROSYN) 500 MG tablet Take 1 tablet (500 mg total) by mouth 2 (two) times daily with a meal. 40 tablet 0   No facility-administered medications prior to visit.    Allergies  Allergen Reactions  . Sulfa Antibiotics Hives and Rash    Patient states her skin burns.  . Flagyl [Metronidazole] Hives and Rash    Patient states her skins burn.    Review of Systems Pertinent positives noted in history of present illness and otherwise negative.   Objective:    Physical Exam Vitals reviewed.  Constitutional:      Appearance: She is obese.  HENT:     Head: Normocephalic.  Cardiovascular:     Rate and Rhythm: Normal rate and regular rhythm.     Pulses:  Normal pulses.     Heart sounds: Normal heart sounds.  Pulmonary:     Effort: Pulmonary effort is normal.     Breath sounds: Normal breath sounds.  Abdominal:     General: There is no distension.     Palpations: Abdomen is soft.     Tenderness: There is no abdominal tenderness. There is no guarding.  Musculoskeletal:  General: Tenderness present. Normal range of motion.     Cervical back: Normal range of motion and neck supple.     Comments: Anterior chest wall -mid sternal and left anterior chest is tender to palpation.  No rash or  deformity.  Skin:    General: Skin is warm and dry.  Neurological:     General: No focal deficit present.     Mental Status: She is alert and oriented to person, place, and time.  Psychiatric:        Mood and Affect: Mood normal.        Behavior: Behavior normal.     BP 128/80 (BP Location: Left Arm, Patient Position: Sitting, Cuff Size: Normal)   Pulse 92   Temp 98.5 F (36.9 C) (Oral)   Ht 5\' 2"  (1.575 m)   Wt 216 lb (98 kg)   SpO2 99%   BMI 39.51 kg/m  Wt Readings from Last 3 Encounters:  12/25/19 216 lb (98 kg)  11/26/19 220 lb (99.8 kg)  11/08/19 220 lb (99.8 kg)     Health Maintenance Due  Topic Date Due  . COVID-19 Vaccine (1) Never done  . PAP SMEAR-Modifier  Never done  . INFLUENZA VACCINE  Never done    There are no preventive care reminders to display for this patient.  Lab Results  Component Value Date   TSH 2.11 11/08/2019   Lab Results  Component Value Date   WBC 6.2 11/26/2019   HGB 13.3 11/26/2019   HCT 39.5 11/26/2019   MCV 82.3 11/26/2019   PLT 172 11/26/2019   Lab Results  Component Value Date   NA 134 (L) 11/26/2019   K 5.1 11/26/2019   CO2 26 11/26/2019   GLUCOSE 112 (H) 11/26/2019   BUN 6 11/26/2019   CREATININE 0.98 11/26/2019   BILITOT 0.5 11/08/2019   ALKPHOS 45 11/08/2019   AST 18 11/08/2019   ALT 23 11/08/2019   PROT 7.5 11/08/2019   ALBUMIN 4.1 11/08/2019   CALCIUM 9.0 11/26/2019    ANIONGAP 9 11/26/2019   GFR 68.26 11/08/2019   Lab Results  Component Value Date   CHOL 190 11/08/2019   Lab Results  Component Value Date   HDL 39.30 11/08/2019   Lab Results  Component Value Date   LDLCALC 119 (H) 11/08/2019   Lab Results  Component Value Date   TRIG 160.0 (H) 11/08/2019   Lab Results  Component Value Date   CHOLHDL 5 11/08/2019   Lab Results  Component Value Date   HGBA1C 5.2 11/08/2019      Assessment & Plan:   Problem List Items Addressed This Visit      Other   Abdominal gas pain - Primary   Gassy chest pain      Suspect that the NSAID may have irritated her stomach.  She reports a history of gassy pain like this in the past usually responding to movement and Tums.  She has not tried Tums.  She also reports a history of heartburn with spicy foods.  Suspect flare of GERD or gastritis.  Close questioning regarding any cardiac symptoms negative for exertional CP shortness of breath DOE.  She does have chest wall tenderness in addition.   We discussed a few preventative measures today including her Pap test which she needs to reschedule.  She also would like a flu shot today.  Questions answered about the Covid vaccine.  Patient advised: You may resume your B12 injections as planned.  Call the clinic to set up vitamin B12 injection at your convenience.  You may resume vitamin D3  weekly and complete that course over the next 8 weeks.  Once you run out of the vitamin D 50,000 IU weekly you will stop that medication.  Purchase vitamin D3 1000 IU over-the-counter and take 1 every day to follow.  For your gas symptoms, purchase Pepcid AC over-the-counter and take 1 of those every day along with Tums as needed.  For your chest tenderness you may take Tylenol as directed on the bottle.  Call back your GYN to make sure you get your Pap test done.  You can get your Covid vaccine a few months after the illness at your pharmacy.   You can get the  flu vaccine any time you are feeling well - when you come in for next B12 shot.    Follow-up: No follow-ups on file.  This visit occurred during the SARS-CoV-2 public health emergency.  Safety protocols were in place, including screening questions prior to the visit, additional usage of staff PPE, and extensive cleaning of exam room while observing appropriate contact time as indicated for disinfecting solutions.    Amedeo Kinsman, NP

## 2019-12-27 ENCOUNTER — Encounter: Payer: Self-pay | Admitting: Nurse Practitioner

## 2020-01-17 DIAGNOSIS — Z20822 Contact with and (suspected) exposure to covid-19: Secondary | ICD-10-CM | POA: Diagnosis not present

## 2020-01-25 ENCOUNTER — Telehealth (INDEPENDENT_AMBULATORY_CARE_PROVIDER_SITE_OTHER): Payer: Medicaid Other | Admitting: Nurse Practitioner

## 2020-01-25 ENCOUNTER — Other Ambulatory Visit: Payer: Self-pay

## 2020-01-25 ENCOUNTER — Encounter: Payer: Self-pay | Admitting: Nurse Practitioner

## 2020-01-25 VITALS — BP 117/74 | HR 79 | Ht 62.0 in | Wt 201.0 lb

## 2020-01-25 DIAGNOSIS — Z6836 Body mass index (BMI) 36.0-36.9, adult: Secondary | ICD-10-CM | POA: Insufficient documentation

## 2020-01-25 DIAGNOSIS — E559 Vitamin D deficiency, unspecified: Secondary | ICD-10-CM | POA: Diagnosis not present

## 2020-01-25 DIAGNOSIS — E538 Deficiency of other specified B group vitamins: Secondary | ICD-10-CM | POA: Diagnosis not present

## 2020-01-25 DIAGNOSIS — J069 Acute upper respiratory infection, unspecified: Secondary | ICD-10-CM | POA: Diagnosis not present

## 2020-01-25 NOTE — Patient Instructions (Addendum)
Please get rest, drink plenty of fluids, and use tylenol or ibuprofen as needed for pain. Follow up if fever >101, if symptoms worsen or if symptoms are not improved in 3 days.   Purchase at the drug store: Mucinex 600 mg by mouth and take as directed to help this secretions.   Simply saline nasal spray - to spray into the nose and help as a decongestant.   Afrin only today as you have used for 3 days.   Use the Flonase nasal spray that you already have as directed.   Call back if not improved next week.  Get help if:  Your symptoms last for 10 days or longer.  Your symptoms get worse over time.  You have a fever.  You have very bad pain in your face or forehead.  Parts of your jaw or neck become very swollen. Get help right away if:  You feel pain or pressure in your chest.  You have shortness of breath.  You faint or feel like you will faint.  You keep throwing up (vomiting).  You feel confused.   Viral Respiratory Infection A viral respiratory infection is an illness that affects parts of the body that are used for breathing. These include the lungs, nose, and throat. It is caused by a germ called a virus. Some examples of this kind of infection are:  A cold.  The flu (influenza).  A respiratory syncytial virus (RSV) infection. A person who gets this illness may have the following symptoms:  A stuffy or runny nose.  Yellow or green fluid in the nose.  A cough.  Sneezing.  Tiredness (fatigue).  Achy muscles.  A sore throat.  Sweating or chills.  A fever.  A headache. Follow these instructions at home: Managing pain and congestion  Take over-the-counter and prescription medicines only as told by your doctor.  If you have a sore throat, gargle with salt water. Do this 3-4 times per day or as needed. To make a salt-water mixture, dissolve -1 tsp of salt in 1 cup of warm water. Make sure that all the salt dissolves.  Use nose drops made from  salt water. This helps with stuffiness (congestion). It also helps soften the skin around your nose.  Drink enough fluid to keep your pee (urine) pale yellow. General instructions   Rest as much as possible.  Do not drink alcohol.  Do not use any products that have nicotine or tobacco, such as cigarettes and e-cigarettes. If you need help quitting, ask your doctor.  Keep all follow-up visits as told by your doctor. This is important. How is this prevented?   Get a flu shot every year. Ask your doctor when you should get your flu shot.  Do not let other people get your germs. If you are sick: ? Stay home from work or school. ? Wash your hands with soap and water often. Wash your hands after you cough or sneeze. If soap and water are not available, use hand sanitizer.  Avoid contact with people who are sick during cold and flu season. This is in fall and winter. Get help if:  Your symptoms last for 10 days or longer.  Your symptoms get worse over time.  You have a fever.  You have very bad pain in your face or forehead.  Parts of your jaw or neck become very swollen. Get help right away if:  You feel pain or pressure in your chest.  You have shortness  of breath.  You faint or feel like you will faint.  You keep throwing up (vomiting).  You feel confused. Summary  A viral respiratory infection is an illness that affects parts of the body that are used for breathing.  Examples of this illness include a cold, the flu, and respiratory syncytial virus (RSV) infection.  The infection can cause a runny nose, cough, sneezing, sore throat, and fever.  Follow what your doctor tells you about taking medicines, drinking lots of fluid, washing your hands, resting at home, and avoiding people who are sick. This information is not intended to replace advice given to you by your health care provider. Make sure you discuss any questions you have with your health care  provider. Document Revised: 03/09/2018 Document Reviewed: 04/11/2017 Elsevier Patient Education  2020 [employer].

## 2020-01-25 NOTE — Progress Notes (Signed)
Virtual Visit via Virtual  Note  This visit type was conducted due to national recommendations for restrictions regarding the COVID-19 pandemic (e.g. social distancing).  This format is felt to be most appropriate for this patient at this time.  All issues noted in this document were discussed and addressed.  No physical exam was performed (except for noted visual exam findings with Video Visits).   I connected with@ on 01/25/20 at 11:00 AM EST by a video enabled telemedicine application or telephone and verified that I am speaking with the correct person using two identifiers. Location patient: home Location provider: work or home office Persons participating in the virtual visit: patient, provider  I discussed the limitations, risks, security and privacy concerns of performing an evaluation and management service by telephone and the availability of in person appointments. I also discussed with the patient that there may be a patient responsible charge related to this service. The patient expressed understanding and agreed to proceed.   Reason for visit: 3-day onset of headache, dry cough and sinus pressure.    HPI: Tammy Frost is a 39 year old with history of vitamin B12 and D deficiency, anxiety, obesity, reports onset 01/21/2020 of nasal congestion and initially this was allergies.  She had a HA,  more congestion with clear  nasal drainage.  No cough, fevers or chills, or body aches. Loose stool x2 this am. She has postnasal drainage. She did take a Covid test that was negative on 01/21/2020.  She has had no Covid exposure.  She has been taking DayQuil and NyQuil with vitamin C and Tylenol.  Tylenol has helped the headaches. Patient reports she has not had the Covid vaccine.  She has not had the flu vaccine.    ROS: See pertinent positives and negatives per HPI.  Past Medical History:  Diagnosis Date  . Anxiety   . Chicken pox   . Gallstones   . UTI (urinary tract infection) 07-20-15    Past  Surgical History:  Procedure Laterality Date  . APPENDECTOMY    . CESAREAN SECTION  12/27/2011  . CESAREAN SECTION  12/31/2013  . CHOLECYSTECTOMY N/A 09/02/2015   Procedure: LAPAROSCOPIC CHOLECYSTECTOMY WITH INTRAOPERATIVE CHOLANGIOGRAM;  Surgeon: Gladis Riffle, MD;  Location: [facility];  Service: General;  Laterality: N/A;  . LAPAROSCOPIC APPENDECTOMY N/A 09/04/2017   Procedure: APPENDECTOMY LAPAROSCOPIC;  Surgeon: Leafy Ro, MD;  Location: [facility];  Service: General;  Laterality: N/A;    Family History  Problem Relation Age of Onset  . Cancer Maternal Grandmother        lung  . Emphysema Maternal Grandmother   . Depression Maternal Grandmother   . Heart disease Maternal Grandmother   . Hyperlipidemia Maternal Grandmother   . Hypertension Maternal Grandmother   . Stroke Maternal Grandmother   . Cancer Maternal Grandfather        pancreatic  . Hypertension Mother   . Depression Mother   . Hyperlipidemia Mother   . Asthma Son   . Cancer Paternal Grandmother   . Cancer Paternal Grandfather   . Heart disease Paternal Grandfather   . Alcohol abuse Neg Hx   . Arthritis Neg Hx   . Birth defects Neg Hx   . COPD Neg Hx   . Diabetes Neg Hx   . Drug abuse Neg Hx   . Early death Neg Hx   . Hearing loss Neg Hx   . Kidney disease Neg Hx   . Learning disabilities Neg Hx   . Mental  illness Neg Hx   . Mental retardation Neg Hx   . Miscarriages / Stillbirths Neg Hx   . Vision loss Neg Hx   . Varicose Veins Neg Hx     SOCIAL HX: Never smoked   Current Outpatient Medications:  .  cyanocobalamin (,VITAMIN B-12,) 1000 MCG/ML injection, 1000 mcg (1 mg) injection once per week for four weeks, followed by 1000 mcg injection once per month., Disp: 1 mL, Rfl: 15 .  Vitamin D, Ergocalciferol, (DRISDOL) 1.25 MG (50000 UNIT) CAPS capsule, Take 1 capsule (50,000 Units total) by mouth every 7 (seven) days. (Patient not taking: Reported on 12/25/2019), Disp: 8 capsule, Rfl:  0  EXAM:  VITALS per patient if applicable: Weight 201, blood pressure 117/74 pulse 79 SO2 97%  GENERAL: alert, oriented, appears well and in no acute distress  HEENT: atraumatic, conjunctiva clear, no obvious abnormalities on inspection of external nose and ears  NECK: normal movements of the head and neck  LUNGS: on inspection no signs of respiratory distress, breathing rate appears normal, no obvious gross SOB, gasping or wheezing  CV: no obvious cyanosis  MS: moves all visible extremities without noticeable abnormality  PSYCH/NEURO: pleasant and cooperative, no obvious depression or anxiety, speech and thought processing grossly intact  ASSESSMENT AND PLAN:  Discussed the following assessment and plan:  No diagnosis found.  Please get rest, drink plenty of fluids, and use tylenol or ibuprofen as needed for pain. Follow up if fever >101, if symptoms worsen or if symptoms are not improved in 3 days.   Purchase at the drug store: Mucinex 600 mg by mouth and take as directed to help this secretions.   Simply saline nasal spray - to spray into the nose and help as a decongestant.   Afrin only today as you have used for 3 days.   Use the Flonase nasal spray that you already have as directed.   Call back if not improved next week.   Get help if:  Your symptoms last for 10 days or longer.  Your symptoms get worse over time.  You have a fever.  You have very bad pain in your face or forehead.  Parts of your jaw or neck become very swollen. Get help right away if:  You feel pain or pressure in your chest.  You have shortness of breath.  You faint or feel like you will faint.  You keep throwing up (vomiting).  You feel confused.  I discussed the assessment and treatment plan with the patient. The patient was provided an opportunity to ask questions and all were answered. The patient agreed with the plan and demonstrated an understanding of the instructions.    The patient was advised to call back or seek an in-person evaluation if the symptoms worsen or if the condition fails to improve as anticipated.  Amedeo Kinsman, NP Adult Nurse Practitioner [facility] [employer] [phone]

## 2020-02-03 IMAGING — CR DG CHEST 2V
1 series · 2 of 2 positions shown · non-contrast
Comparison: 12/20/2017

CLINICAL DATA: Cough

EXAM:
CHEST - 2 VIEW

[Series 1: dg chest 2 view · 0.14mm/px · 2 of 2 slices shown]
[im 1/2]
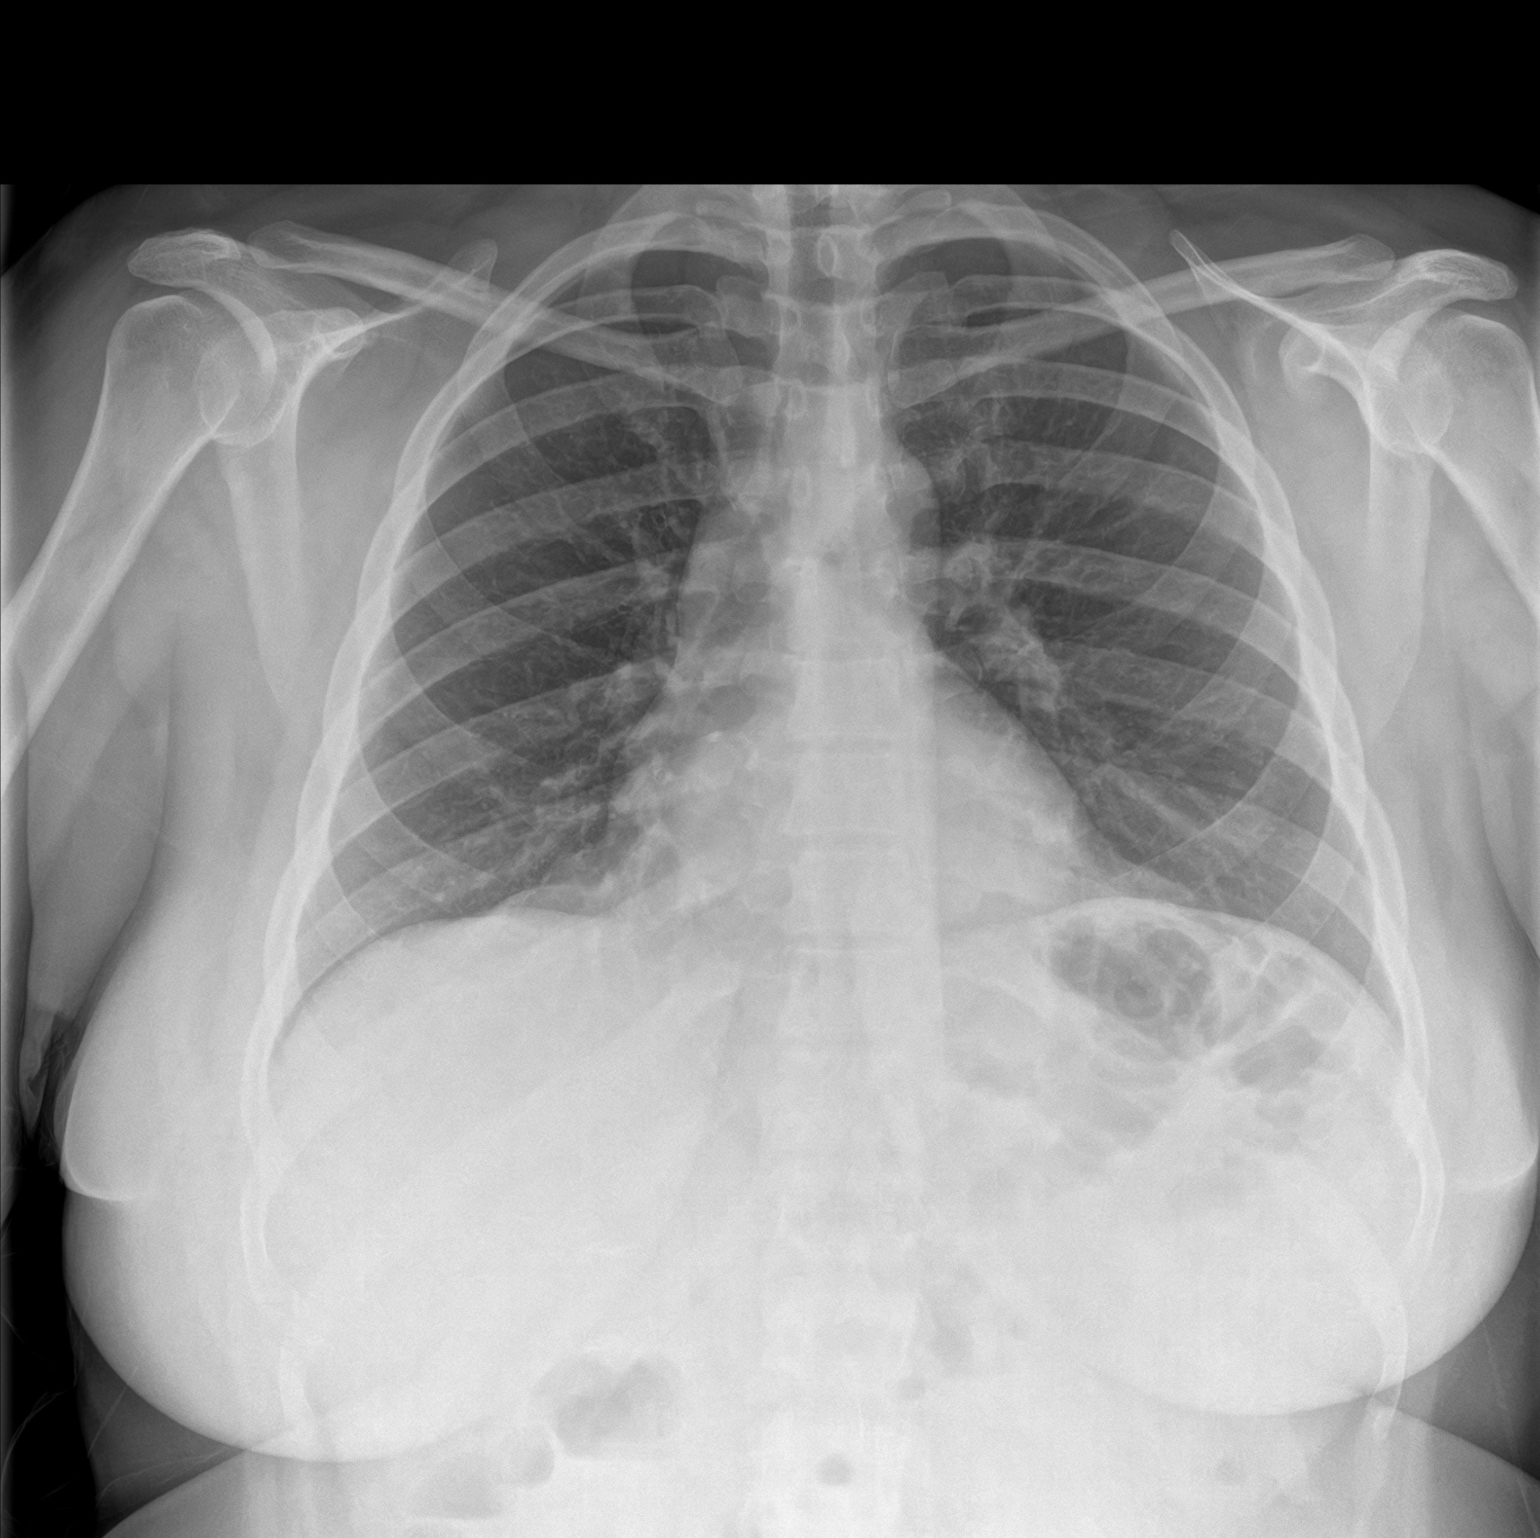
[im 2/2]
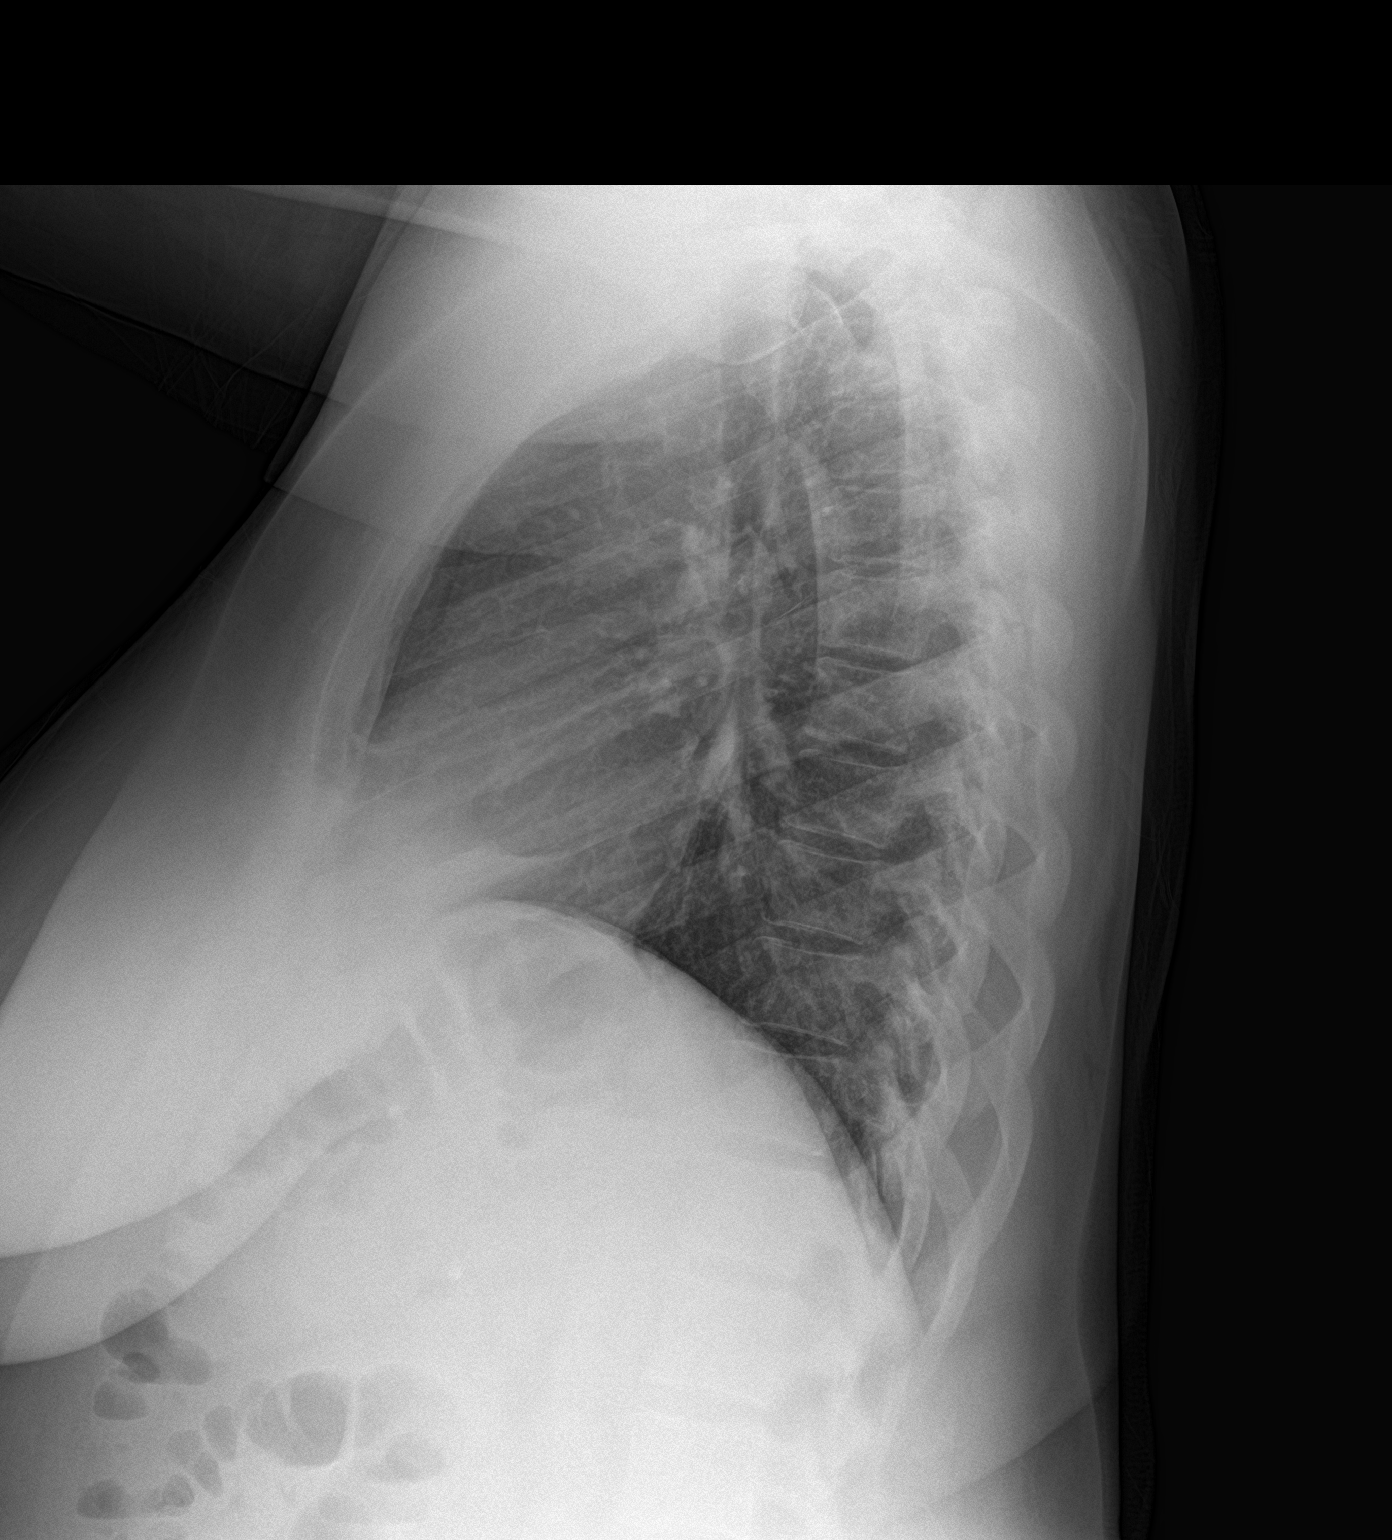

[2 of 2 positions shown; findings below may reference images not displayed]

FINDINGS: Heart and mediastinal contours are within normal limits. No focal
opacities or effusions. No acute bony abnormality.
IMPRESSION: No active cardiopulmonary disease.

## 2020-02-13 ENCOUNTER — Ambulatory Visit: Payer: Medicaid Other | Admitting: Nurse Practitioner

## 2020-04-01 DIAGNOSIS — Z20822 Contact with and (suspected) exposure to covid-19: Secondary | ICD-10-CM | POA: Diagnosis not present

## 2020-04-15 ENCOUNTER — Telehealth: Payer: Medicaid Other | Admitting: Physician Assistant

## 2020-04-15 DIAGNOSIS — J329 Chronic sinusitis, unspecified: Secondary | ICD-10-CM | POA: Diagnosis not present

## 2020-04-15 MED ORDER — AMOXICILLIN-POT CLAVULANATE 875-125 MG PO TABS: 1.0000 | ORAL_TABLET | Freq: Two times a day (BID) | ORAL | 0 refills | Status: DC

## 2020-04-15 MED ORDER — FLUTICASONE PROPIONATE 50 MCG/ACT NA SUSP: 2.0000 | Freq: Every day | NASAL | 0 refills | Status: DC

## 2020-04-15 NOTE — Progress Notes (Signed)
Erroneous encounter. Patient canceled via MyChart.

## 2020-04-15 NOTE — Progress Notes (Signed)
We are sorry that you are not feeling well.  Here is how we plan to help!  Based on what you have shared with me it looks like you have sinusitis.  Sinusitis is inflammation and infection in the sinus cavities of the head.  Based on your presentation I believe you most likely have Acute Viral Sinusitis.This is an infection most likely caused by a virus. There is not specific treatment for viral sinusitis other than to help you with the symptoms until the infection runs its course.  You may use an oral decongestant such as Mucinex D or if you have glaucoma or high blood pressure use plain Mucinex. Saline nasal spray help and can safely be used as often as needed for congestion, I have prescribed: Fluticasone nasal spray two sprays in each nostril once a day   Sometimes viral sinus infections can progress into bacterial sinus infections. Usually this only occurs after symptoms have been present for 1 week or if you develop a fever. If your symptoms persist past 7 days or if you develop a fever, I have prescribed an antibiotic, Augmentin, to take twice daily for your symptoms. Please monitor symptoms and do not fill the prescription unless your symptoms persist or progress.   Some authorities believe that zinc sprays or the use of Echinacea may shorten the course of your symptoms.  Sinus infections are not as easily transmitted as other respiratory infection, however we still recommend that you avoid close contact with loved ones, especially the very young and elderly.  Remember to wash your hands thoroughly throughout the day as this is the number one way to prevent the spread of infection!  Home Care: Only take medications as instructed by your medical team. Do not take these medications with alcohol. A steam or ultrasonic humidifier can help congestion.  You can place a towel over your head and breathe in the steam from hot water coming from a faucet. Avoid close contacts especially the very young and  the elderly. Cover your mouth when you cough or sneeze. Always remember to wash your hands.  Get Help Right Away If: You develop worsening fever or sinus pain. You develop a severe head ache or visual changes. Your symptoms persist after you have completed your treatment plan.  Make sure you Understand these instructions. Will watch your condition. Will get help right away if you are not doing well or get worse.  Your e-visit answers were reviewed by a board certified advanced clinical practitioner to complete your personal care plan.  Depending on the condition, your plan could have included both over the counter or prescription medications.  If there is a problem please reply  once you have received a response from your provider.  Your safety is important to us.  If you have drug allergies check your prescription carefully.    You can use MyChart to ask questions about today's visit, request a non-urgent call back, or ask for a work or school excuse for 24 hours related to this e-Visit. If it has been greater than 24 hours you will need to follow up with your provider, or enter a new e-Visit to address those concerns.  You will get an e-mail in the next two days asking about your experience.  I hope that your e-visit has been valuable and will speed your recovery. Thank you for using e-visits.  Approximately 5 minutes was spent documenting and reviewing patient's chart.  

## 2020-05-02 ENCOUNTER — Encounter: Payer: Self-pay | Admitting: Family Medicine

## 2020-05-02 ENCOUNTER — Telehealth: Payer: Medicaid Other | Admitting: Family Medicine

## 2020-05-02 DIAGNOSIS — K047 Periapical abscess without sinus: Secondary | ICD-10-CM | POA: Diagnosis not present

## 2020-05-02 DIAGNOSIS — K029 Dental caries, unspecified: Secondary | ICD-10-CM | POA: Diagnosis not present

## 2020-05-02 DIAGNOSIS — S025XXA Fracture of tooth (traumatic), initial encounter for closed fracture: Secondary | ICD-10-CM | POA: Diagnosis not present

## 2020-05-02 MED ORDER — CLINDAMYCIN HCL 300 MG PO CAPS: 300.0000 mg | ORAL_CAPSULE | Freq: Three times a day (TID) | ORAL | 0 refills | Status: AC
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
## 2020-05-02 NOTE — Patient Instructions (Signed)
Clindamycin capsules What is this medicine? CLINDAMYCIN (KLIN da MYE sin) is a lincosamide antibiotic. It is used to treat certain kinds of bacterial infections. It will not work for colds, flu, or other viral infections. This medicine may be used for other purposes; ask your health care provider or pharmacist if you have questions. COMMON BRAND NAME(S): Cleocin What should I tell my health care provider before I take this medicine? They need to know if you have any of these conditions:  kidney disease  liver disease  stomach problems like colitis  an unusual or allergic reaction to clindamycin, lincomycin, or other medicines, foods, dyes like tartrazine or preservatives  pregnant or trying to get pregnant  breast-feeding How should I use this medicine? Take this medicine by mouth with a full glass of water. Follow the directions on the prescription label. You can take this medicine with food or on an empty stomach. If the medicine upsets your stomach, take it with food. Take your medicine at regular intervals. Do not take your medicine more often than directed. Take all of your medicine as directed even if you think your are better. Do not skip doses or stop your medicine early. Talk to your pediatrician regarding the use of this medicine in children. Special care may be needed. Overdosage: If you think you have taken too much of this medicine contact a poison control center or emergency room at once. NOTE: This medicine is only for you. Do not share this medicine with others. What if I miss a dose? If you miss a dose, take it as soon as you can. If it is almost time for your next dose, take only that dose. Do not take double or extra doses. What may interact with this medicine?  birth control pills  erythromycin  medicines that relax muscles for surgery  rifampin This list may not describe all possible interactions. Give your health care provider a list of all the  medicines, herbs, non-prescription drugs, or dietary supplements you use. Also tell them if you smoke, drink alcohol, or use illegal drugs. Some items may interact with your medicine. What should I watch for while using this medicine? Tell your doctor or health care provider if your symptoms do not start to get better or if they get worse. This medicine may cause serious skin reactions. They can happen weeks to months after starting the medicine. Contact your health care provider right away if you notice fevers or flu-like symptoms with a rash. The rash may be red or purple and then turn into blisters or peeling of the skin. Or, you might notice a red rash with swelling of the face, lips or lymph nodes in your neck or under your arms. Do not treat diarrhea with over the counter products. Contact your doctor if you have diarrhea that lasts more than 2 days or if it is severe and watery. What side effects may I notice from receiving this medicine? Side effects that you should report to your doctor or health care professional as soon as possible:  allergic reactions like skin rash, itching or hives, swelling of the face, lips, or tongue  dark urine  pain on swallowing  rash, fever, and swollen lymph nodes  redness, blistering, peeling or loosening of the skin, including inside the mouth  unusual bleeding or bruising  unusually weak or tired  yellowing of eyes or skin Side effects that usually do not require medical attention (report to your doctor or health  care professional if they continue or are bothersome):  diarrhea  itching in the rectal or genital area  joint pain  nausea, vomiting  stomach pain This list may not describe all possible side effects. Call your doctor for medical advice about side effects. You may report side effects to FDA at 1-800-FDA-1088. Where should I keep my medicine? Keep out of the reach of children. Store at room temperature between 20 and 25 degrees C  (68 and 77 degrees F). Throw away any unused medicine after the expiration date. NOTE: This sheet is a summary. It may not cover all possible information. If you have questions about this medicine, talk to your doctor, pharmacist, or health care provider.  2021 Elsevier/Gold Standard (2018-06-01 12:02:12)    Dental Pain Dental pain is often a sign that something is wrong with your teeth or gums. You can also have pain after a dental treatment. If you have dental pain, it is important to contact your dentist, especially if the cause of the pain is not known. Dental pain may hurt a lot or a little and can be caused by many things, including:  Tooth decay (cavities or caries).  Infection.  The inner part of the tooth being filled with pus (an abscess).  Injury.  A crack in the tooth.  Gums that move back and expose the root of a tooth.  Gum disease.  Abnormal grinding or clenching of teeth.  Not taking good care of your teeth. Sometimes the cause of pain is not known. You may have pain all the time, or it may happen only when you are:  Chewing.  Exposed to hot or cold temperatures.  Eating or drinking foods or drinks that have a lot of sugar in them, such as soda or candy. Follow these instructions at home: Medicines  Take over-the-counter and prescription medicines only as told by your dentist.  If you were prescribed an antibiotic medicine, take it as told by your dentist. Do not stop taking it even if you start to feel better. Eating and drinking Do not eat foods or drinks that cause you pain. These include:  Very hot or very cold foods or drinks.  Sweet or sugary foods or drinks. Managing pain and swelling  If told, put ice on the painful area of your face. To do this: ? Put ice in a plastic bag. ? Place a towel between your skin and the bag. ? Leave the ice on for 20 minutes, 2-3 times a day. ? Take off the ice if your skin turns bright red. This is very  important. If you cannot feel pain, heat, or cold, you have a greater risk of damage to the area.   Brushing your teeth  Brush your teeth twice a day using a fluoride toothpaste.  Use a toothpaste made for sensitive teeth as told by your dentist.  Use a soft toothbrush. General instructions  Floss your teeth at least once a day.  Do not put heat on the outside of your face.  Rinse your mouth often with salt water. To make salt water, dissolve -1 tsp (3-6 g) of salt in 1 cup (237 mL) of warm water.  Watch your dental pain. Let your dentist know if there are any changes.  Keep all follow-up visits. Contact a dentist if:  You have dental pain and you do not know why.  Medicine does not help your pain.  Your symptoms get worse.  You have new symptoms. Get help  right away if:  You cannot open your mouth.  You are having trouble breathing or swallowing.  You have a fever.  Your face, neck, or jaw is swollen. These symptoms may be an emergency. Get help right away. Call your local emergency services (911 in the [place]  Do not wait to see if the symptoms will go away.  Do not drive yourself to the hospital. Summary  Dental pain may be caused by many things, including tooth decay, injury, or infection. In some cases, the cause is not known.  Dental pain may hurt a lot or very little. You may have pain all the time, or you may have it only when you eat or drink.  Take over-the-counter and prescription medicines only as told by your dentist.  Watch your dental pain for any changes. Let your dentist know if symptoms get worse. This information is not intended to replace advice given to you by your health care provider. Make sure you discuss any questions you have with your health care provider. Document Revised: 12/05/2019 Document Reviewed: 12/05/2019 Elsevier Patient Education  2021 [employer].

## 2020-05-22 ENCOUNTER — Encounter: Payer: Self-pay | Admitting: Physician Assistant

## 2020-05-22 ENCOUNTER — Other Ambulatory Visit: Payer: Self-pay | Admitting: Physician Assistant

## 2020-05-22 ENCOUNTER — Ambulatory Visit (INDEPENDENT_AMBULATORY_CARE_PROVIDER_SITE_OTHER): Payer: Medicaid Other | Admitting: Physician Assistant

## 2020-05-22 DIAGNOSIS — F411 Generalized anxiety disorder: Secondary | ICD-10-CM

## 2020-05-22 DIAGNOSIS — E538 Deficiency of other specified B group vitamins: Secondary | ICD-10-CM

## 2020-05-22 DIAGNOSIS — E6609 Other obesity due to excess calories: Secondary | ICD-10-CM

## 2020-05-22 DIAGNOSIS — Z6839 Body mass index (BMI) 39.0-39.9, adult: Secondary | ICD-10-CM | POA: Diagnosis not present

## 2020-05-22 DIAGNOSIS — E66812 Obesity, class 2: Secondary | ICD-10-CM

## 2020-05-22 DIAGNOSIS — E559 Vitamin D deficiency, unspecified: Secondary | ICD-10-CM

## 2020-05-22 DIAGNOSIS — Z7689 Persons encountering health services in other specified circumstances: Secondary | ICD-10-CM | POA: Diagnosis not present

## 2020-05-22 DIAGNOSIS — E282 Polycystic ovarian syndrome: Secondary | ICD-10-CM | POA: Diagnosis not present

## 2020-05-22 MED ORDER — ESCITALOPRAM OXALATE 10 MG PO TABS: 10.0000 mg | ORAL_TABLET | Freq: Every day | ORAL | 1 refills | Status: DC

## 2020-05-22 MED ORDER — CYANOCOBALAMIN 1000 MCG/ML IJ SOLN
1000.0000 ug | Freq: Once | INTRAMUSCULAR | Status: AC
  Administered 2020-05-22: 1000 ug via INTRAMUSCULAR

## 2020-05-22 MED ORDER — SERTRALINE HCL 25 MG PO TABS: 25.0000 mg | ORAL_TABLET | Freq: Every day | ORAL | 2 refills | Status: DC

## 2020-05-22 NOTE — Progress Notes (Unsigned)
[facility] [address] [place], [place] [id number]  Internal MEDICINE  Office Visit Note  Patient Name: Tammy Levester FreshM Frost  1982  [id number]  Date of Service: 05/24/2020   Complaints/HPI Pt is here for establishment of PCP. Chief Complaint  Patient presents with  . New Patient (Initial Visit)    Anxiety, weight  . Anxiety  . Quality Metric Gaps    pap   HPI Pt is here to establish care. She has hx of PCOS, with hx of very large ovarian cysts that come and go. -Pt has anxiety and will have panic attacks at times. Dx in 2011. Given xanax at that time which she reports knocked her out and she did not like it. Never been on a daily anxiety med. She works as Conservation officer, naturecashier at [employer]. Anxiety not as bad at work bc able to focus on tasks at hand.  -sleeps well, gets more than 7 hours. In bed by 10pm and wakes up at 6am. -Previously found to be low in B12 and was supposed to get B12 injections at prior PCP. She got 1 dose but then got covid and then her last provider was leaving and never got back in for injections. Had plan for weekly x 4 then monthly x3 before repeat blood work. -Also did Vit D-1 pill per week x4 weeks, no longer ron this. Needs to start OTC supplement. -Has appt with [facility] for further PCOS management. [facility] in August. -Vitals reviewed.  Current Medication: Outpatient Encounter Medications as of 05/22/2020  Medication Sig  . [DISCONTINUED] escitalopram (LEXAPRO) 10 MG tablet Take 1 tablet (10 mg total) by mouth daily.  . [DISCONTINUED] sertraline (ZOLOFT) 25 MG tablet Take 1 tablet (25 mg total) by mouth daily.  . cyanocobalamin (,VITAMIN B-12,) 1000 MCG/ML injection 1000 mcg (1 mg) injection once per week for four weeks, followed by 1000 mcg injection once per month.  . [DISCONTINUED] albuterol (PROVENTIL HFA;VENTOLIN HFA) 108 (90 Base) MCG/ACT inhaler Inhale 2 puffs into the lungs every 6 (six) hours as needed for wheezing or  shortness of breath. (Patient not taking: Reported on 07/10/2018)  . [DISCONTINUED] fluticasone (FLONASE) 50 MCG/ACT nasal spray Place 2 sprays into both nostrils daily.  . [DISCONTINUED] ipratropium (ATROVENT) 0.06 % nasal spray Place 2 sprays into both nostrils 4 (four) times daily. (Patient not taking: Reported on 07/10/2018)  . [DISCONTINUED] Vitamin D, Ergocalciferol, (DRISDOL) 1.25 MG (50000 UNIT) CAPS capsule Take 1 capsule (50,000 Units total) by mouth every 7 (seven) days. (Patient not taking: Reported on 12/25/2019)   Facility-Administered Encounter Medications as of 05/22/2020  Medication  . cyanocobalamin ((VITAMIN B-12)) injection 1,000 mcg    Surgical History: Past Surgical History:  Procedure Laterality Date  . APPENDECTOMY    . CESAREAN SECTION  12/27/2011  . CESAREAN SECTION  12/31/2013  . CHOLECYSTECTOMY N/A 09/02/2015   Procedure: LAPAROSCOPIC CHOLECYSTECTOMY WITH INTRAOPERATIVE CHOLANGIOGRAM;  Surgeon: Gladis Riffleatherine L Loflin, MD;  Location: [facility] ORS;  Service: General;  Laterality: N/A;  . CHOLECYSTECTOMY    . LAPAROSCOPIC APPENDECTOMY N/A 09/04/2017   Procedure: APPENDECTOMY LAPAROSCOPIC;  Surgeon: Leafy RoPabon, Diego F, MD;  Location: [facility] ORS;  Service: General;  Laterality: N/A;    Medical History: Past Medical History:  Diagnosis Date  . Abdominal gas pain 12/25/2019  . Anxiety   . Chicken pox   . Chronic cholecystitis with calculus   . Gallstones   . UTI (urinary tract infection) 07-20-15    Family History: Family History  Problem  Relation Age of Onset  . Cancer Maternal Grandmother        lung  . Emphysema Maternal Grandmother   . Depression Maternal Grandmother   . Heart disease Maternal Grandmother   . Hyperlipidemia Maternal Grandmother   . Hypertension Maternal Grandmother   . Stroke Maternal Grandmother   . Cancer Maternal Grandfather        pancreatic  . Hypertension Mother   . Depression Mother   . Hyperlipidemia Mother   . Asthma Son   . Cancer  Paternal Grandmother   . Cancer Paternal Grandfather   . Heart disease Paternal Grandfather   . Alcohol abuse Neg Hx   . Arthritis Neg Hx   . Birth defects Neg Hx   . COPD Neg Hx   . Diabetes Neg Hx   . Drug abuse Neg Hx   . Early death Neg Hx   . Hearing loss Neg Hx   . Kidney disease Neg Hx   . Learning disabilities Neg Hx   . Mental illness Neg Hx   . Mental retardation Neg Hx   . Miscarriages / Stillbirths Neg Hx   . Vision loss Neg Hx   . Varicose Veins Neg Hx     Social History   Socioeconomic History  . Marital status: Single    Spouse name: Not on file  . Number of children: Not on file  . Years of education: Not on file  . Highest education level: Not on file  Occupational History  . Occupation: unemployed  Tobacco Use  . Smoking status: Never Smoker  . Smokeless tobacco: Never Used  Vaping Use  . Vaping Use: Never used  Substance and Sexual Activity  . Alcohol use: No  . Drug use: No  . Sexual activity: Yes    Birth control/protection: None  Other Topics Concern  . Not on file  Social History Narrative   Single has  boyfriend and lives with mother Has 2 boys    Social Determinants of Health   Financial Resource Strain: Not on file  Food Insecurity: Not on file  Transportation Needs: Not on file  Physical Activity: Not on file  Stress: Not on file  Social Connections: Not on file  Intimate Partner Violence: Not on file     Review of Systems  Constitutional: Negative for chills, fatigue and unexpected weight change.  HENT: Negative for congestion, postnasal drip, rhinorrhea, sneezing and sore throat.   Eyes: Negative for redness.  Respiratory: Negative for cough, chest tightness and shortness of breath.   Cardiovascular: Negative for chest pain and palpitations.  Gastrointestinal: Negative for abdominal pain, diarrhea, nausea and vomiting.  Genitourinary: Negative for dysuria and frequency.  Musculoskeletal: Negative for arthralgias, back pain,  joint swelling and neck pain.  Skin: Negative for rash.  Neurological: Negative.  Negative for tremors and numbness.  Hematological: Negative for adenopathy. Does not bruise/bleed easily.  Psychiatric/Behavioral: Negative for behavioral problems (Depression), sleep disturbance and suicidal ideas. The patient is nervous/anxious.     Vital Signs: BP 118/76   Pulse 85   Temp (!) 97.1 F (36.2 C)   Resp 16   Ht 5\' 2"  (1.575 m)   Wt 218 lb (98.9 kg)   SpO2 99%   BMI 39.87 kg/m    Physical Exam Constitutional:      General: She is not in acute distress.    Appearance: She is well-developed. She is obese. She is not diaphoretic.  HENT:     Head:  Normocephalic and atraumatic.     Mouth/Throat:     Pharynx: No oropharyngeal exudate.  Eyes:     Pupils: Pupils are equal, round, and reactive to light.  Neck:     Thyroid: No thyromegaly.     Vascular: No JVD.     Trachea: No tracheal deviation.  Cardiovascular:     Rate and Rhythm: Normal rate and regular rhythm.     Heart sounds: Normal heart sounds. No murmur heard. No friction rub. No gallop.   Pulmonary:     Effort: Pulmonary effort is normal. No respiratory distress.     Breath sounds: No wheezing or rales.  Chest:     Chest wall: No tenderness.  Abdominal:     General: Bowel sounds are normal. There is no distension.     Palpations: Abdomen is soft.     Tenderness: There is no abdominal tenderness.  Musculoskeletal:        General: Normal range of motion.     Cervical back: Normal range of motion and neck supple.  Lymphadenopathy:     Cervical: No cervical adenopathy.  Skin:    General: Skin is warm and dry.  Neurological:     Mental Status: She is alert and oriented to person, place, and time.     Cranial Nerves: No cranial nerve deficit.  Psychiatric:        Behavior: Behavior normal.        Thought Content: Thought content normal.        Judgment: Judgment normal.       Assessment/Plan: 1. Encounter to  establish care with new doctor Pt had recent lab work and well woman exam in August with previous provider.   2. GAD (generalized anxiety disorder) Will start on Zoloft due to generalized anxiety with some increased panic-like stressors.  3. Vitamin B12 deficiency Star B12 injections today, and weekly for 2 more weeks, then switch to monthly for 3 months and recheck labs. - cyanocobalamin ((VITAMIN B-12)) injection 1,000 mcg  4. Vitamin D deficiency Daily supplement of 1000IU. Will recheck levels at next lab draw.  5. PCOS (polycystic ovarian syndrome) Appt to establish with new OBGYN for management.  6. Class 2 obesity due to excess calories without serious comorbidity with body mass index (BMI) of 39.0 to 39.9 in adult Obesity Counseling: Had a lengthy discussion regarding patients BMI and weight issues. Patient was instructed on portion control as well as increased activity. Also discussed caloric restrictions with trying to maintain intake less than 2000 Kcal. Discussions were made in accordance with the 5As of weight management. Simple actions such as not eating late and if able to, taking a walk is suggested.   General Counseling: Brittani verbalizes understanding of the findings of todays visit and agrees with plan of treatment. I have discussed any further diagnostic evaluation that may be needed or ordered today. We also reviewed her medications today. she has been encouraged to call the office with any questions or concerns that should arise related to todays visit.    Counseling:    No orders of the defined types were placed in this encounter.   Meds ordered this encounter  Medications  . DISCONTD: escitalopram (LEXAPRO) 10 MG tablet    Sig: Take 1 tablet (10 mg total) by mouth daily.    Dispense:  30 tablet    Refill:  1  . DISCONTD: sertraline (ZOLOFT) 25 MG tablet    Sig: Take 1 tablet (25 mg total) by  mouth daily.    Dispense:  30 tablet    Refill:  2  .  cyanocobalamin ((VITAMIN B-12)) injection 1,000 mcg     This patient was seen by Lynn Ito, PA-C in collaboration with Dr. Beverely Risen as a part of collaborative care agreement.   Time spent:30 Minutes

## 2020-05-28 ENCOUNTER — Other Ambulatory Visit: Payer: Self-pay

## 2020-05-28 ENCOUNTER — Encounter: Payer: Self-pay | Admitting: Physician Assistant

## 2020-05-28 ENCOUNTER — Ambulatory Visit: Payer: Medicaid Other

## 2020-05-28 DIAGNOSIS — E538 Deficiency of other specified B group vitamins: Secondary | ICD-10-CM

## 2020-05-28 MED ORDER — CYANOCOBALAMIN 1000 MCG/ML IJ SOLN
1000.0000 ug | Freq: Once | INTRAMUSCULAR | Status: AC
  Administered 2020-05-28: 1000 ug via INTRAMUSCULAR

## 2020-06-04 ENCOUNTER — Other Ambulatory Visit: Payer: Self-pay

## 2020-06-04 ENCOUNTER — Ambulatory Visit: Payer: Medicaid Other

## 2020-06-04 DIAGNOSIS — E538 Deficiency of other specified B group vitamins: Secondary | ICD-10-CM

## 2020-06-04 MED ORDER — CYANOCOBALAMIN 1000 MCG/ML IJ SOLN
1000.0000 ug | Freq: Once | INTRAMUSCULAR | Status: AC
  Administered 2020-06-04: 1000 ug via INTRAMUSCULAR

## 2020-06-19 ENCOUNTER — Encounter: Payer: Self-pay | Admitting: Physician Assistant

## 2020-06-19 ENCOUNTER — Ambulatory Visit: Payer: Medicaid Other | Admitting: Physician Assistant

## 2020-06-19 ENCOUNTER — Other Ambulatory Visit: Payer: Self-pay | Admitting: Physician Assistant

## 2020-06-19 DIAGNOSIS — E538 Deficiency of other specified B group vitamins: Secondary | ICD-10-CM | POA: Diagnosis not present

## 2020-06-19 DIAGNOSIS — E282 Polycystic ovarian syndrome: Secondary | ICD-10-CM

## 2020-06-19 DIAGNOSIS — F411 Generalized anxiety disorder: Secondary | ICD-10-CM

## 2020-06-19 DIAGNOSIS — E559 Vitamin D deficiency, unspecified: Secondary | ICD-10-CM

## 2020-06-19 DIAGNOSIS — R590 Localized enlarged lymph nodes: Secondary | ICD-10-CM

## 2020-06-19 NOTE — Progress Notes (Signed)
[facility] [address] [place], [place] [address]  Internal MEDICINE  Office Visit Note  Patient Name: Tammy Frost  [id number]  [id number]  Date of Service: 06/25/2020  Chief Complaint  Patient presents with  . Anxiety    4 week f-up  . Quality Metric Gaps    pap    HPI Pt is here for f/u.  -She was started on 25mg  Zoloft last visit due to anxiety with panic attacks. The medication is helping and she is having fewer panic attacks. She feels like the current dose is working well, but we discussed that she could increase to 50mg  if she felt her anxiety wasn't being controlled well enough on the 25mg  dose. -She has also been receiving B12 injections the past 3 weeks and will need to begin monthly injections. -Swollen place under R arm for the past month sometimes it gets bigger and smaller -had a tooth infection 3-4 weeks ago and was treated with ABX and resolved. No open wounds on rash noted in axillary region where bump noted. It is tender to touch. -Appt at OBGYN later this month to address PCOS -been to eye doctor and dentist since last visit  Current Medication: Outpatient Encounter Medications as of 06/19/2020  Medication Sig  . cyanocobalamin (,VITAMIN B-12,) 1000 MCG/ML injection 1000 mcg (1 mg) injection once per week for four weeks, followed by 1000 mcg injection once per month.  . sertraline (ZOLOFT) 25 MG tablet TAKE 1 TABLET (25 MG TOTAL) BY MOUTH DAILY.  . [DISCONTINUED] albuterol (PROVENTIL HFA;VENTOLIN HFA) 108 (90 Base) MCG/ACT inhaler Inhale 2 puffs into the lungs every 6 (six) hours as needed for wheezing or shortness of breath. (Patient not taking: Reported on 07/10/2018)  . [DISCONTINUED] ipratropium (ATROVENT) 0.06 % nasal spray Place 2 sprays into both nostrils 4 (four) times daily. (Patient not taking: Reported on 07/10/2018)   No facility-administered encounter medications on file as of 06/19/2020.    Surgical History: Past Surgical History:   Procedure Laterality Date  . APPENDECTOMY    . CESAREAN SECTION  12/27/2011  . CESAREAN SECTION  12/31/2013  . CHOLECYSTECTOMY N/A 09/02/2015   Procedure: LAPAROSCOPIC CHOLECYSTECTOMY WITH INTRAOPERATIVE CHOLANGIOGRAM;  Surgeon: 12/29/2011, MD;  Location: [facility] ORS;  Service: General;  Laterality: N/A;  . CHOLECYSTECTOMY    . LAPAROSCOPIC APPENDECTOMY N/A 09/04/2017   Procedure: APPENDECTOMY LAPAROSCOPIC;  Surgeon: 09/04/2015, MD;  Location: [facility] ORS;  Service: General;  Laterality: N/A;    Medical History: Past Medical History:  Diagnosis Date  . Abdominal gas pain 12/25/2019  . Anxiety   . Chicken pox   . Chronic cholecystitis with calculus   . Depression   . Gallstones   . UTI (urinary tract infection) 07-20-15    Family History: Family History  Problem Relation Age of Onset  . Cancer Maternal Grandmother        lung  . Emphysema Maternal Grandmother   . Depression Maternal Grandmother   . Heart disease Maternal Grandmother   . Hyperlipidemia Maternal Grandmother   . Hypertension Maternal Grandmother   . Stroke Maternal Grandmother   . Cancer Maternal Grandfather        pancreatic  . Hypertension Mother   . Depression Mother   . Hyperlipidemia Mother   . Asthma Son   . Cancer Paternal Grandmother   . Cancer Paternal Grandfather   . Heart disease Paternal Grandfather   . Alcohol abuse Neg Hx   . Arthritis Neg Hx   .  Birth defects Neg Hx   . COPD Neg Hx   . Diabetes Neg Hx   . Drug abuse Neg Hx   . Early death Neg Hx   . Hearing loss Neg Hx   . Kidney disease Neg Hx   . Learning disabilities Neg Hx   . Mental illness Neg Hx   . Mental retardation Neg Hx   . Miscarriages / Stillbirths Neg Hx   . Vision loss Neg Hx   . Varicose Veins Neg Hx     Social History   Socioeconomic History  . Marital status: Single    Spouse name: Not on file  . Number of children: Not on file  . Years of education: Not on file  . Highest education level: Not on  file  Occupational History  . Occupation: unemployed  Tobacco Use  . Smoking status: Never Smoker  . Smokeless tobacco: Never Used  Vaping Use  . Vaping Use: Never used  Substance and Sexual Activity  . Alcohol use: No  . Drug use: No  . Sexual activity: Yes    Birth control/protection: None  Other Topics Concern  . Not on file  Social History Narrative   Single has  boyfriend and lives with mother Has 2 boys    Social Determinants of Health   Financial Resource Strain: Not on file  Food Insecurity: Not on file  Transportation Needs: Not on file  Physical Activity: Not on file  Stress: Not on file  Social Connections: Not on file  Intimate Partner Violence: Not on file      Review of Systems  Constitutional: Negative for chills, fatigue and unexpected weight change.  HENT: Negative for congestion, postnasal drip, rhinorrhea, sneezing and sore throat.   Eyes: Negative for redness.  Respiratory: Negative for cough, chest tightness and shortness of breath.   Cardiovascular: Negative for chest pain and palpitations.  Gastrointestinal: Negative for abdominal pain, constipation, diarrhea, nausea and vomiting.  Genitourinary: Negative for dysuria and frequency.  Musculoskeletal: Negative for arthralgias, back pain, joint swelling and neck pain.  Skin: Negative for rash and wound.  Neurological: Negative.  Negative for tremors and numbness.  Hematological: Positive for adenopathy. Does not bruise/bleed easily.       Tender lump in R axilla  Psychiatric/Behavioral: Negative for behavioral problems (Depression), sleep disturbance and suicidal ideas. The patient is nervous/anxious.     Vital Signs: BP 112/76   Pulse 81   Temp 98.1 F (36.7 C)   Resp 16   Ht 5\' 2"  (1.575 m)   Wt 215 lb 3.2 oz (97.6 kg)   SpO2 99%   BMI 39.36 kg/m    Physical Exam Vitals and nursing note reviewed.  Constitutional:      General: She is not in acute distress.    Appearance: She is  well-developed. She is obese. She is not diaphoretic.  HENT:     Head: Normocephalic and atraumatic.     Mouth/Throat:     Pharynx: No oropharyngeal exudate.  Eyes:     Pupils: Pupils are equal, round, and reactive to light.  Neck:     Thyroid: No thyromegaly.     Vascular: No JVD.     Trachea: No tracheal deviation.  Cardiovascular:     Rate and Rhythm: Normal rate and regular rhythm.     Heart sounds: Normal heart sounds. No murmur heard. No friction rub. No gallop.   Pulmonary:     Effort: Pulmonary effort is normal. No  respiratory distress.     Breath sounds: No wheezing or rales.  Chest:  Breasts:     Right: Axillary adenopathy present.     Abdominal:     General: Bowel sounds are normal.     Palpations: Abdomen is soft.  Musculoskeletal:        General: Normal range of motion.     Cervical back: Normal range of motion and neck supple.  Lymphadenopathy:     Cervical: No cervical adenopathy.     Upper Body:     Right upper body: Axillary adenopathy present.  Skin:    General: Skin is warm and dry.  Neurological:     Mental Status: She is alert and oriented to person, place, and time.     Cranial Nerves: No cranial nerve deficit.  Psychiatric:        Behavior: Behavior normal.        Thought Content: Thought content normal.        Judgment: Judgment normal.        Assessment/Plan: 1. GAD (generalized anxiety disorder) Continue zoloft, may try increasing to 50mg  if anxiety not as well controlled otherwise may continue at 25mg .  2. Axillary adenopathy Will order mammogram for further diagnostics based on location of adenopathy. Will also go ahead and schedule of axilla if mammogram does not show anything and for further evaluation. - MM DIAG BREAST TOMO BILATERAL - AXILLA RIGHT; Future  3. PCOS (polycystic ovarian syndrome) Has upcoming appt with OBGYN for this  4. Vitamin D deficiency Continue vitamin D supplment  5. Vitamin B12 deficiency She  has done weekly B12 for the last 3 weeks and will be set up on monthly injections now  General Counseling: Krislyn verbalizes understanding of the findings of todays visit and agrees with plan of treatment. I have discussed any further diagnostic evaluation that may be needed or ordered today. We also reviewed her medications today. she has been encouraged to call the office with any questions or concerns that should arise related to todays visit.    Orders Placed This Encounter  Procedures  . MM DIAG BREAST TOMO BILATERAL  . [place] AXILLA RIGHT    No orders of the defined types were placed in this encounter.   This patient was seen by May, PA-C in collaboration with Dr. [place] as a part of collaborative care agreement.   Total time spent:30 Minutes Time spent includes review of chart, medications, test results, and follow up plan with the patient.      Dr Lynn Ito Internal medicine

## 2020-06-23 ENCOUNTER — Encounter: Payer: Self-pay | Admitting: Family Medicine

## 2020-06-24 ENCOUNTER — Telehealth: Payer: Self-pay

## 2020-06-24 NOTE — Telephone Encounter (Signed)
Faxed over Diagnostic mammogram order to [facility] imaging at [phone]. Copy placed in scan. Toni Amend

## 2020-06-25 ENCOUNTER — Encounter: Payer: Self-pay | Admitting: Internal Medicine

## 2020-06-25 NOTE — Telephone Encounter (Signed)
Please see

## 2020-06-30 DIAGNOSIS — H5213 Myopia, bilateral: Secondary | ICD-10-CM | POA: Diagnosis not present

## 2020-07-03 ENCOUNTER — Other Ambulatory Visit: Payer: Self-pay | Admitting: Physician Assistant

## 2020-07-03 ENCOUNTER — Ambulatory Visit: Payer: Medicaid Other

## 2020-07-15 ENCOUNTER — Encounter: Payer: Self-pay | Admitting: Internal Medicine

## 2020-07-15 NOTE — Telephone Encounter (Signed)
Can you please review and see what can be done, I know pt needs an appt but seems to be hard now to make appt.  Thank you

## 2020-07-17 ENCOUNTER — Telehealth: Payer: Medicaid Other | Admitting: Physician Assistant

## 2020-07-17 DIAGNOSIS — S025XXS Fracture of tooth (traumatic), sequela: Secondary | ICD-10-CM

## 2020-07-17 DIAGNOSIS — K047 Periapical abscess without sinus: Secondary | ICD-10-CM

## 2020-07-17 MED ORDER — AMOXICILLIN 500 MG PO CAPS: 500.0000 mg | ORAL_CAPSULE | Freq: Three times a day (TID) | ORAL | 0 refills | Status: AC

## 2020-07-17 NOTE — Progress Notes (Signed)
I have spent 5 minutes in review of e-visit questionnaire, review and updating patient chart, medical decision making and response to patient.   Khloee Garza Cody Poseidon Pam, PA-C    

## 2020-07-17 NOTE — Progress Notes (Signed)
E-Visit for Dental Pain  We are sorry that you are not feeling well.  Here is how we plan to help!  Based on what you have shared with me in the questionnaire, it sounds like you have possible abscess around the fractured/decayed tooth. I am glad you have an appointment with your dentist upcoming from extraction to prevent recurring infection and pain with this tooth.  Amoxicillin 500mg  3 times per day for 10 days  It is imperative that you see a dentist within 10 days of this eVisit to determine the cause of the dental pain and be sure it is adequately treated  A toothache or tooth pain is caused when the nerve in the root of a tooth or surrounding a tooth is irritated. Dental (tooth) infection, decay, injury, or loss of a tooth are the most common causes of dental pain. Pain may also occur after an extraction (tooth is pulled out). Pain sometimes originates from other areas and radiates to the jaw, thus appearing to be tooth pain.Bacteria growing inside your mouth can contribute to gum disease and dental decay, both of which can cause pain. A toothache occurs from inflammation of the central portion of the tooth called pulp. The pulp contains nerve endings that are very sensitive to pain. Inflammation to the pulp or pulpitis may be caused by dental cavities, trauma, and infection.    HOME CARE:   For toothaches: . Over-the-counter pain medications such as acetaminophen or ibuprofen may be used. Take these as directed on the package while you arrange for a dental appointment. . Avoid very cold or hot foods, because they may make the pain worse. . You may get relief from biting on a cotton ball soaked in oil of cloves. You can get oil of cloves at most drug stores.  For jaw pain: .  Aspirin may be helpful for problems in the joint of the jaw in adults. . If pain happens every time you open your mouth widely, the temporomandibular joint (TMJ) may be the source of the pain. Yawning or taking a  large bite of food may worsen the pain. An appointment with your doctor or dentist will help you find the cause.     GET HELP RIGHT AWAY IF:  . You have a high fever or chills . If you have had a recent head or face injury and develop headache, light headedness, nausea, vomiting, or other symptoms that concern you after an injury to your face or mouth, you could have a more serious injury in addition to your dental injury. . A facial rash associated with a toothache: This condition may improve with medication. Contact your doctor for them to decide what is appropriate. . Any jaw pain occurring with chest pain: Although jaw pain is most commonly caused by dental disease, it is sometimes referred pain from other areas. People with heart disease, especially people who have had stents placed, people with diabetes, or those who have had heart surgery may have jaw pain as a symptom of heart attack or angina. If your jaw or tooth pain is associated with lightheadedness, sweating, or shortness of breath, you should see a doctor as soon as possible. . Trouble swallowing or excessive pain or bleeding from gums: If you have a history of a weakened immune system, diabetes, or steroid use, you may be more susceptible to infections. Infections can often be more severe and extensive or caused by unusual organisms. Dental and gum infections in people with these conditions  may require more aggressive treatment. An abscess may need draining or IV antibiotics, for example.  MAKE SURE YOU    Understand these instructions.  Will watch your condition.  Will get help right away if you are not doing well or get worse.  Thank you for choosing an e-visit. Your e-visit answers were reviewed by a board certified advanced clinical practitioner to complete your personal care plan. Depending upon the condition, your plan could have included both over the counter or prescription medications. Please review your pharmacy  choice. Make sure the pharmacy is open so you can pick up prescription now. If there is a problem, you may contact your provider through [employer] and have the prescription routed to another pharmacy. Your safety is important to [place]. If you have drug allergies check your prescription carefully.  For the next 24 hours you can use MyChart to ask questions about today's visit, request a non-urgent call back, or ask for a work or school excuse. You will get an email in the next two days asking about your experience. I hope that your e-visit has been valuable and will speed your recovery.

## 2020-08-18 ENCOUNTER — Emergency Department
Admission: EM | Admit: 2020-08-18 | Discharge: 2020-08-18 | Disposition: A | Discharge status: Home / Self Care | Payer: Medicaid Other | Attending: Emergency Medicine | Admitting: Emergency Medicine

## 2020-08-18 ENCOUNTER — Emergency Department: Payer: Medicaid Other

## 2020-08-18 ENCOUNTER — Other Ambulatory Visit: Payer: Self-pay

## 2020-08-18 ENCOUNTER — Encounter: Payer: Self-pay | Admitting: Emergency Medicine

## 2020-08-18 DIAGNOSIS — R059 Cough, unspecified: Secondary | ICD-10-CM | POA: Diagnosis not present

## 2020-08-18 DIAGNOSIS — Z20822 Contact with and (suspected) exposure to covid-19: Secondary | ICD-10-CM | POA: Diagnosis not present

## 2020-08-18 DIAGNOSIS — B9789 Other viral agents as the cause of diseases classified elsewhere: Secondary | ICD-10-CM | POA: Diagnosis not present

## 2020-08-18 DIAGNOSIS — J069 Acute upper respiratory infection, unspecified: Secondary | ICD-10-CM | POA: Diagnosis not present

## 2020-08-18 LAB — RESP PANEL BY RT-PCR (FLU A&B, COVID) ARPGX2
Influenza A by PCR: NEGATIVE
Influenza B by PCR: NEGATIVE
SARS Coronavirus 2 by RT PCR: NEGATIVE

## 2020-08-18 MED ORDER — ONDANSETRON 4 MG PO TBDP: 4.0000 mg | ORAL_TABLET | Freq: Three times a day (TID) | ORAL | 0 refills | Status: DC | PRN

## 2020-08-18 MED ORDER — ONDANSETRON 4 MG PO TBDP
4.0000 mg | ORAL_TABLET | Freq: Once | ORAL | Status: AC
  Administered 2020-08-18: 4 mg via ORAL
  Filled 2020-08-18: qty 1

## 2020-08-18 NOTE — ED Provider Notes (Signed)
[facility] Emergency Department Provider Note  ____________________________________________  Time seen: Approximately 4:22 AM  I have reviewed the triage vital signs and the nursing notes.   HISTORY  Chief Complaint Cough and Nausea   HPI Tammy Frost is a 40 y.o. female presents for evaluation of cough.   Patient reports 2 days of cough, nasal congestion, nausea.  Both sons have had similar symptoms.  She denies any fever, chest pain or shortness of breath, no abdominal pain, no vomiting or diarrhea.  She took a home COVID test that was negative.  Past Medical History:  Diagnosis Date  . Abdominal gas pain 12/25/2019  . Anxiety   . Chicken pox   . Chronic cholecystitis with calculus   . Depression   . Gallstones   . UTI (urinary tract infection) 07-20-15    Patient Active Problem List   Diagnosis Date Noted  . Closed fracture of tooth 05/02/2020  . URI with cough and congestion 01/25/2020  . Upper respiratory tract infection 01/25/2020  . Body mass index (BMI) 36.0-36.9, adult 01/25/2020  . Low serum vitamin B12 01/25/2020  . Vitamin D deficiency 01/25/2020  . Anxiety and depression 11/08/2019  . Encounter for preventative adult health care examination 11/08/2019  . Situational anxiety 11/01/2019  . Obesity, morbid, BMI 40.0-49.9 (HCC) 11/01/2019    Past Surgical History:  Procedure Laterality Date  . APPENDECTOMY    . CESAREAN SECTION  12/27/2011  . CESAREAN SECTION  12/31/2013  . CHOLECYSTECTOMY N/A 09/02/2015   Procedure: LAPAROSCOPIC CHOLECYSTECTOMY WITH INTRAOPERATIVE CHOLANGIOGRAM;  Surgeon: Gladis Riffle, MD;  Location: [facility] ORS;  Service: General;  Laterality: N/A;  . CHOLECYSTECTOMY    . LAPAROSCOPIC APPENDECTOMY N/A 09/04/2017   Procedure: APPENDECTOMY LAPAROSCOPIC;  Surgeon: Leafy Ro, MD;  Location: [facility] ORS;  Service: General;  Laterality: N/A;    Prior to Admission medications   Medication Sig Start Date End Date  Taking? Authorizing Provider  ondansetron (ZOFRAN ODT) 4 MG disintegrating tablet Take 1 tablet (4 mg total) by mouth every 8 (eight) hours as needed. 08/18/20  Yes Don Perking, [place], MD  cyanocobalamin (,VITAMIN B-12,) 1000 MCG/ML injection 1000 mcg (1 mg) injection once per week for four weeks, followed by 1000 mcg injection once per month. 11/08/19   Theadore Nan, NP  sertraline (ZOLOFT) 25 MG tablet TAKE 1 TABLET (25 MG TOTAL) BY MOUTH DAILY. 05/22/20   Lyndon Code, MD  albuterol (PROVENTIL HFA;VENTOLIN HFA) 108 (90 Base) MCG/ACT inhaler Inhale 2 puffs into the lungs every 6 (six) hours as needed for wheezing or shortness of breath. Patient not taking: Reported on 07/10/2018 10/03/17 09/19/18  Nita Sickle, MD  ipratropium (ATROVENT) 0.06 % nasal spray Place 2 sprays into both nostrils 4 (four) times daily. Patient not taking: Reported on 07/10/2018 07/09/18 09/19/18  Ofilia Neas, PA-C    Allergies Sulfa antibiotics and Flagyl [metronidazole]  Family History  Problem Relation Age of Onset  . Cancer Maternal Grandmother        lung  . Emphysema Maternal Grandmother   . Depression Maternal Grandmother   . Heart disease Maternal Grandmother   . Hyperlipidemia Maternal Grandmother   . Hypertension Maternal Grandmother   . Stroke Maternal Grandmother   . Cancer Maternal Grandfather        pancreatic  . Hypertension Mother   . Depression Mother   . Hyperlipidemia Mother   . Asthma Son   . Cancer Paternal Grandmother   . Cancer Paternal  Grandfather   . Heart disease Paternal Grandfather   . Alcohol abuse Neg Hx   . Arthritis Neg Hx   . Birth defects Neg Hx   . COPD Neg Hx   . Diabetes Neg Hx   . Drug abuse Neg Hx   . Early death Neg Hx   . Hearing loss Neg Hx   . Kidney disease Neg Hx   . Learning disabilities Neg Hx   . Mental illness Neg Hx   . Mental retardation Neg Hx   . Miscarriages / Stillbirths Neg Hx   . Vision loss Neg Hx   . Varicose Veins Neg Hx      Social History Social History   Tobacco Use  . Smoking status: Never Smoker  . Smokeless tobacco: Never Used  Vaping Use  . Vaping Use: Never used  Substance Use Topics  . Alcohol use: No  . Drug use: No    Review of Systems  Constitutional: Negative for fever. Eyes: Negative for visual changes. ENT: Negative for sore throat. + congestion Neck: No neck pain  Cardiovascular: Negative for chest pain. Respiratory: Negative for shortness of breath. + cough Gastrointestinal: Negative for abdominal pain, vomiting or diarrhea. + nausea Genitourinary: Negative for dysuria. Musculoskeletal: Negative for back pain. Skin: Negative for rash. Neurological: Negative for headaches, weakness or numbness. Psych: No SI or HI  ____________________________________________   PHYSICAL EXAM:  VITAL SIGNS: ED Triage Vitals  Enc Vitals Group     BP 08/18/20 0032 (!) 132/93     Pulse Rate 08/18/20 0032 99     Resp 08/18/20 0032 18     Temp 08/18/20 0032 99.1 F (37.3 C)     Temp Source 08/18/20 0032 Oral     SpO2 08/18/20 0032 100 %     Weight 08/18/20 0033 197 lb (89.4 kg)     Height 08/18/20 0033 5\' 2"  (1.575 m)     Head Circumference --      Peak Flow --      Pain Score 08/18/20 0033 6     Pain Loc --      Pain Edu? --      Excl. in GC? --     Constitutional: Alert and oriented. Well appearing and in no apparent distress. HEENT:      Head: Normocephalic and atraumatic.         Eyes: Conjunctivae are normal. Sclera is non-icteric.       Mouth/Throat: Mucous membranes are moist.       Neck: Supple with no signs of meningismus. Cardiovascular: Regular rate and rhythm. No murmurs, gallops, or rubs. 2+ symmetrical distal pulses are present in all extremities. No JVD. Respiratory: Normal respiratory effort. Lungs are clear to auscultation bilaterally.  Gastrointestinal: Soft, non tender. Musculoskeletal:  No edema, cyanosis, or erythema of extremities. Neurologic: Normal  speech and language. Face is symmetric. Moving all extremities. No gross focal neurologic deficits are appreciated. Skin: Skin is warm, dry and intact. No rash noted. Psychiatric: Mood and affect are normal. Speech and behavior are normal.  ____________________________________________   LABS (all labs ordered are listed, but only abnormal results are displayed)  Labs Reviewed  RESP PANEL BY RT-PCR (FLU A&B, COVID) [id number]   ____________________________________________  EKG  none  ____________________________________________  RADIOLOGY  I have personally reviewed the images performed during this visit and I agree with the Radiologist's read.   Interpretation by Radiologist:  DG Chest 2 View  Result Date: 08/18/2020 CLINICAL DATA:  Cough EXAM: CHEST - 2 VIEW COMPARISON:  09/04/2019 FINDINGS: The heart size and mediastinal contours are within normal limits. Both lungs are clear. The visualized skeletal structures are unremarkable. IMPRESSION: No active cardiopulmonary disease. Electronically Signed   By: Deatra Robinson M.D.   On: 08/18/2020 01:08     ____________________________________________   PROCEDURES  Procedure(s) performed: None Procedures Critical Care performed:  None ____________________________________________   INITIAL IMPRESSION / ASSESSMENT AND PLAN / ED COURSE   40 y.o. female presents for evaluation of cough, congestion, and nausea.  Patient is household family members have had the same symptoms.  She is well-appearing in no distress with normal work of breathing, normal sats both at rest and ambulation, lungs are clear to auscultation.  COVID and flu negative.  Chest x-ray negative for pneumonia.  Presentation concerning with a viral URI.  Patient reports nausea therefore will give a prescription for Zofran.  Otherwise supportive care and follow-up with PCP.  Discussed my standard return precautions        _____________________________________________ Please note:  Patient was evaluated in Emergency Department today for the symptoms described in the history of present illness. Patient was evaluated in the context of the global COVID-19 pandemic, which necessitated consideration that the patient might be at risk for infection with the SARS-CoV-2 virus that causes COVID-19. Institutional protocols and algorithms that pertain to the evaluation of patients at risk for COVID-19 are in a state of rapid change based on information released by regulatory bodies including the CDC and federal and state organizations. These policies and algorithms were followed during the patient's care in the ED.  Some ED evaluations and interventions may be delayed as a result of limited staffing during the pandemic.   [place] Controlled Substance Database was reviewed by me. ____________________________________________   FINAL CLINICAL IMPRESSION(S) / ED DIAGNOSES   Final diagnoses:  Viral URI with cough      NEW MEDICATIONS STARTED DURING THIS VISIT:  ED Discharge Orders         Ordered    ondansetron (ZOFRAN ODT) 4 MG disintegrating tablet  Every 8 hours PRN        08/18/20 0422           Note:  This document was prepared using Dragon voice recognition software and may include unintentional dictation errors.    Don Perking, [place], MD 08/18/20 [phone]

## 2020-08-18 NOTE — ED Triage Notes (Signed)
Pt arrived via POV with reports of cough and nasal drainage x 2 days. Pt states the drainage is causing nausea.  Child also being seen today with cold sxs.  Pt took at home covid test that was negative.

## 2020-08-18 NOTE — ED Notes (Signed)
ED Provider at bedside. 

## 2020-08-26 ENCOUNTER — Encounter: Payer: Self-pay | Admitting: Physician Assistant

## 2020-08-26 NOTE — Telephone Encounter (Signed)
Can I see her today?

## 2020-08-29 ENCOUNTER — Telehealth: Payer: Medicaid Other | Admitting: Physician Assistant

## 2020-08-29 DIAGNOSIS — J019 Acute sinusitis, unspecified: Secondary | ICD-10-CM

## 2020-08-29 DIAGNOSIS — B9689 Other specified bacterial agents as the cause of diseases classified elsewhere: Secondary | ICD-10-CM | POA: Diagnosis not present

## 2020-08-29 MED ORDER — AMOXICILLIN-POT CLAVULANATE 875-125 MG PO TABS: 1.0000 | ORAL_TABLET | Freq: Two times a day (BID) | ORAL | 0 refills | Status: DC

## 2020-08-29 NOTE — Progress Notes (Signed)

## 2020-08-29 NOTE — Progress Notes (Signed)
I have spent 5 minutes in review of e-visit questionnaire, review and updating patient chart, medical decision making and response to patient.   Kinlie Janice Cody Seyon Strader, PA-C    

## 2020-09-02 ENCOUNTER — Telehealth: Payer: Self-pay

## 2020-09-02 MED ORDER — AMOXICILLIN 875 MG PO TABS: 875.0000 mg | ORAL_TABLET | Freq: Two times a day (BID) | ORAL | 0 refills | Status: AC

## 2020-09-02 NOTE — Addendum Note (Signed)
Addended by: Waldon Merl on: 09/02/2020 07:33 PM   Modules accepted: Orders

## 2020-09-02 NOTE — Telephone Encounter (Signed)
Tried contacting patient to be seen for most recent ed visit and no dial tone unable to reach. Tammy Frost

## 2020-09-18 ENCOUNTER — Ambulatory Visit: Payer: Medicaid Other | Admitting: Physician Assistant

## 2020-09-22 ENCOUNTER — Telehealth: Payer: Self-pay

## 2020-09-22 NOTE — Telephone Encounter (Signed)
Left vm to reschedule 09/18/20 missed appointment-Toni

## 2020-10-24 ENCOUNTER — Telehealth: Payer: Medicaid Other

## 2020-10-24 ENCOUNTER — Telehealth: Payer: Medicaid Other | Admitting: Nurse Practitioner

## 2020-10-24 DIAGNOSIS — K0889 Other specified disorders of teeth and supporting structures: Secondary | ICD-10-CM | POA: Diagnosis not present

## 2020-10-24 MED ORDER — NAPROXEN 500 MG PO TABS: 500.0000 mg | ORAL_TABLET | Freq: Two times a day (BID) | ORAL | 0 refills | Status: DC

## 2020-10-24 MED ORDER — AMOXICILLIN 500 MG PO CAPS: 500.0000 mg | ORAL_CAPSULE | Freq: Three times a day (TID) | ORAL | 0 refills | Status: AC

## 2020-10-24 NOTE — Progress Notes (Signed)
E-Visit for Dental Pain  We are sorry that you are not feeling well.  Here is how we plan to help!  Based on what you have shared with me in the questionnaire, it sounds like you have pain and infection from trauma to your tooth  naprosyn 500mg  2 times per day for 7 days for discomfort and Amoxicillin 500mg  3 times per day for 10 days  Do not use other ibuprofen, motrin, aleve or Advil while using Naprosyn Be sure you take your Naprosyn with food each time to protect your stomach    It is imperative that you see a dentist within 10 days of this eVisit to determine the cause of the dental pain and be sure it is adequately treated  A toothache or tooth pain is caused when the nerve in the root of a tooth or surrounding a tooth is irritated. Dental (tooth) infection, decay, injury, or loss of a tooth are the most common causes of dental pain. Pain may also occur after an extraction (tooth is pulled out). Pain sometimes originates from other areas and radiates to the jaw, thus appearing to be tooth pain.Bacteria growing inside your mouth can contribute to gum disease and dental decay, both of which can cause pain. A toothache occurs from inflammation of the central portion of the tooth called pulp. The pulp contains nerve endings that are very sensitive to pain. Inflammation to the pulp or pulpitis may be caused by dental cavities, trauma, and infection.    HOME CARE:   For toothaches: Over-the-counter pain medications such as acetaminophen or ibuprofen may be used. Take these as directed on the package while you arrange for a dental appointment. Avoid very cold or hot foods, because they may make the pain worse. You may get relief from biting on a cotton ball soaked in oil of cloves. You can get oil of cloves at most drug stores.  For jaw pain:  Aspirin may be helpful for problems in the joint of the jaw in adults. If pain happens every time you open your mouth widely, the temporomandibular  joint (TMJ) may be the source of the pain. Yawning or taking a large bite of food may worsen the pain. An appointment with your doctor or dentist will help you find the cause.     GET HELP RIGHT AWAY IF:  You have a high fever or chills If you have had a recent head or face injury and develop headache, light headedness, nausea, vomiting, or other symptoms that concern you after an injury to your face or mouth, you could have a more serious injury in addition to your dental injury. A facial rash associated with a toothache: This condition may improve with medication. Contact your doctor for them to decide what is appropriate. Any jaw pain occurring with chest pain: Although jaw pain is most commonly caused by dental disease, it is sometimes referred pain from other areas. People with heart disease, especially people who have had stents placed, people with diabetes, or those who have had heart surgery may have jaw pain as a symptom of heart attack or angina. If your jaw or tooth pain is associated with lightheadedness, sweating, or shortness of breath, you should see a doctor as soon as possible. Trouble swallowing or excessive pain or bleeding from gums: If you have a history of a weakened immune system, diabetes, or steroid use, you may be more susceptible to infections. Infections can often be more severe and extensive or caused by  unusual organisms. Dental and gum infections in people with these conditions may require more aggressive treatment. An abscess may need draining or IV antibiotics, for example.  MAKE SURE YOU   Understand these instructions. Will watch your condition. Will get help right away if you are not doing well or get worse.  Thank you for choosing an e-visit.  Your e-visit answers were reviewed by a board certified advanced clinical practitioner to complete your personal care plan. Depending upon the condition, your plan could have included both over the counter or  prescription medications.  Please review your pharmacy choice. Make sure the pharmacy is open so you can pick up prescription now. If there is a problem, you may contact your provider through [employer] and have the prescription routed to another pharmacy.  Your safety is important to [place]. If you have drug allergies check your prescription carefully.   For the next 24 hours you can use MyChart to ask questions about today's visit, request a non-urgent call back, or ask for a work or school excuse. You will get an email in the next two days asking about your experience. I hope that your e-visit has been valuable and will speed your recovery.   Meds ordered this encounter  Medications   naproxen (NAPROSYN) 500 MG tablet    Sig: Take 1 tablet (500 mg total) by mouth 2 (two) times daily with a meal.    Dispense:  30 tablet    Refill:  0   amoxicillin (AMOXIL) 500 MG capsule    Sig: Take 1 capsule (500 mg total) by mouth 3 (three) times daily for 10 days.    Dispense:  30 capsule    Refill:  0    I spent approximately 10 minutes reviewing the patient's history, current symptoms and coordinating their care today.

## 2020-10-30 DIAGNOSIS — Z20822 Contact with and (suspected) exposure to covid-19: Secondary | ICD-10-CM | POA: Diagnosis not present

## 2020-12-20 ENCOUNTER — Telehealth: Payer: Medicaid Other | Admitting: Nurse Practitioner

## 2020-12-20 DIAGNOSIS — K0889 Other specified disorders of teeth and supporting structures: Secondary | ICD-10-CM

## 2020-12-20 MED ORDER — NAPROXEN 500 MG PO TABS: 500.0000 mg | ORAL_TABLET | Freq: Two times a day (BID) | ORAL | 0 refills | Status: DC

## 2020-12-20 MED ORDER — CHLORHEXIDINE GLUCONATE 0.12 % MT SOLN: 15.0000 mL | Freq: Two times a day (BID) | OROMUCOSAL | 0 refills | Status: DC

## 2020-12-20 NOTE — Progress Notes (Signed)
I have spent 5 minutes in review of e-visit questionnaire, review and updating patient chart, medical decision making and response to patient.  ° °Analya Louissaint W Aarian Cleaver, NP ° °  °

## 2020-12-20 NOTE — Progress Notes (Signed)
E-Visit for Dental Pain  We are sorry that you are not feeling well.  Here is how we plan to help!  Based on what you have shared with me in the questionnaire, it sounds like you have dental pain from a fractured tooth. As you recently received antibiotics for this 45 days ago you will need to follow up with the dentist as soon as possible to evaluate your teeth. I have sent pain medication and an antibacterial mouth rinse to help reduce any bacteria in your mouth.   naprosyn 500mg  2 times per day for 7 days for discomfort and peridex mouth rinse  It is imperative that you see a dentist within 10 days of this eVisit to determine the cause of the dental pain and be sure it is adequately treated  A toothache or tooth pain is caused when the nerve in the root of a tooth or surrounding a tooth is irritated. Dental (tooth) infection, decay, injury, or loss of a tooth are the most common causes of dental pain. Pain may also occur after an extraction (tooth is pulled out). Pain sometimes originates from other areas and radiates to the jaw, thus appearing to be tooth pain.Bacteria growing inside your mouth can contribute to gum disease and dental decay, both of which can cause pain. A toothache occurs from inflammation of the central portion of the tooth called pulp. The pulp contains nerve endings that are very sensitive to pain. Inflammation to the pulp or pulpitis may be caused by dental cavities, trauma, and infection.    HOME CARE:   For toothaches: Over-the-counter pain medications such as acetaminophen or ibuprofen may be used. Take these as directed on the package while you arrange for a dental appointment. Avoid very cold or hot foods, because they may make the pain worse. You may get relief from biting on a cotton ball soaked in oil of cloves. You can get oil of cloves at most drug stores.  For jaw pain:  Aspirin may be helpful for problems in the joint of the jaw in adults. If pain happens  every time you open your mouth widely, the temporomandibular joint (TMJ) may be the source of the pain. Yawning or taking a large bite of food may worsen the pain. An appointment with your doctor or dentist will help you find the cause.     GET HELP RIGHT AWAY IF:  You have a high fever or chills If you have had a recent head or face injury and develop headache, light headedness, nausea, vomiting, or other symptoms that concern you after an injury to your face or mouth, you could have a more serious injury in addition to your dental injury. A facial rash associated with a toothache: This condition may improve with medication. Contact your doctor for them to decide what is appropriate. Any jaw pain occurring with chest pain: Although jaw pain is most commonly caused by dental disease, it is sometimes referred pain from other areas. People with heart disease, especially people who have had stents placed, people with diabetes, or those who have had heart surgery may have jaw pain as a symptom of heart attack or angina. If your jaw or tooth pain is associated with lightheadedness, sweating, or shortness of breath, you should see a doctor as soon as possible. Trouble swallowing or excessive pain or bleeding from gums: If you have a history of a weakened immune system, diabetes, or steroid use, you may be more susceptible to infections. Infections can  often be more severe and extensive or caused by unusual organisms. Dental and gum infections in people with these conditions may require more aggressive treatment. An abscess may need draining or IV antibiotics, for example.  MAKE SURE YOU   Understand these instructions. Will watch your condition. Will get help right away if you are not doing well or get worse.  Thank you for choosing an e-visit.  Your e-visit answers were reviewed by a board certified advanced clinical practitioner to complete your personal care plan. Depending upon the condition, your  plan could have included both over the counter or prescription medications.  Please review your pharmacy choice. Make sure the pharmacy is open so you can pick up prescription now. If there is a problem, you may contact your provider through [employer] and have the prescription routed to another pharmacy.  Your safety is important to [place]. If you have drug allergies check your prescription carefully.   For the next 24 hours you can use MyChart to ask questions about today's visit, request a non-urgent call back, or ask for a work or school excuse. You will get an email in the next two days asking about your experience. I hope that your e-visit has been valuable and will speed your recovery.

## 2021-02-06 DIAGNOSIS — R059 Cough, unspecified: Secondary | ICD-10-CM | POA: Diagnosis not present

## 2021-02-06 DIAGNOSIS — J069 Acute upper respiratory infection, unspecified: Secondary | ICD-10-CM | POA: Diagnosis not present

## 2021-02-06 DIAGNOSIS — R079 Chest pain, unspecified: Secondary | ICD-10-CM | POA: Diagnosis not present

## 2021-02-06 DIAGNOSIS — J3489 Other specified disorders of nose and nasal sinuses: Secondary | ICD-10-CM | POA: Diagnosis not present

## 2021-02-06 DIAGNOSIS — Z20822 Contact with and (suspected) exposure to covid-19: Secondary | ICD-10-CM | POA: Insufficient documentation

## 2021-02-07 ENCOUNTER — Emergency Department: Payer: Medicaid Other

## 2021-02-07 ENCOUNTER — Other Ambulatory Visit: Payer: Self-pay

## 2021-02-07 ENCOUNTER — Emergency Department
Admission: EM | Admit: 2021-02-07 | Discharge: 2021-02-07 | Disposition: A | Discharge status: Home / Self Care | Payer: Medicaid Other | Attending: Emergency Medicine | Admitting: Emergency Medicine

## 2021-02-07 DIAGNOSIS — R059 Cough, unspecified: Secondary | ICD-10-CM | POA: Diagnosis not present

## 2021-02-07 DIAGNOSIS — J069 Acute upper respiratory infection, unspecified: Secondary | ICD-10-CM

## 2021-02-07 LAB — RESP PANEL BY RT-PCR (FLU A&B, COVID) ARPGX2
Influenza A by PCR: NEGATIVE
Influenza B by PCR: NEGATIVE
SARS Coronavirus 2 by RT PCR: NEGATIVE

## 2021-02-07 MED ORDER — PREDNISONE 10 MG (21) PO TBPK: ORAL_TABLET | ORAL | 0 refills | Status: DC

## 2021-02-07 MED ORDER — BENZONATATE 100 MG PO CAPS: 100.0000 mg | ORAL_CAPSULE | Freq: Three times a day (TID) | ORAL | 0 refills | Status: DC | PRN

## 2021-02-07 MED ORDER — AZITHROMYCIN 250 MG PO TABS: ORAL_TABLET | ORAL | 0 refills | Status: AC

## 2021-02-07 NOTE — ED Triage Notes (Signed)
Pt c/o cough, congestion, and runny nose that has been lasting a month. Per pt, chest and back feels tight. Pt denies fevers.

## 2021-02-07 NOTE — ED Provider Notes (Signed)
[facility] Emergency Department Provider Note  ____________________________________________   Event Date/Time   First MD Initiated Contact with Patient 02/07/21 0500     (approximate)  I have reviewed the triage vital signs and the nursing notes.   HISTORY  Chief Complaint URI    HPI Tammy Frost is a 40 y.o. female with no significant past medical history who presents to the emergency department with 1 month of intermittent sinus pressure, nasal congestion, productive cough.  No recent fevers.  States she has had some chest discomfort with coughing.  No vomiting or diarrhea.  Has had multiple sick contacts.  States she has not been seen by anyone for this.  She has not been on any medications for this.        Past Medical History:  Diagnosis Date   Abdominal gas pain 12/25/2019   Anxiety    Chicken pox    Chronic cholecystitis with calculus    Depression    Gallstones    UTI (urinary tract infection) 07-20-15    Patient Active Problem List   Diagnosis Date Noted   Closed fracture of tooth 05/02/2020   URI with cough and congestion 01/25/2020   Upper respiratory tract infection 01/25/2020   Body mass index (BMI) 36.0-36.9, adult 01/25/2020   Low serum vitamin B12 01/25/2020   Vitamin D deficiency 01/25/2020   Anxiety and depression 11/08/2019   Encounter for preventative adult health care examination 11/08/2019   Situational anxiety 11/01/2019   Obesity, morbid, BMI 40.0-49.9 (HCC) 11/01/2019    Past Surgical History:  Procedure Laterality Date   APPENDECTOMY     CESAREAN SECTION  12/27/2011   CESAREAN SECTION  12/31/2013   CHOLECYSTECTOMY N/A 09/02/2015   Procedure: LAPAROSCOPIC CHOLECYSTECTOMY WITH INTRAOPERATIVE CHOLANGIOGRAM;  Surgeon: Gladis Riffle, MD;  Location: [facility] ORS;  Service: General;  Laterality: N/A;   CHOLECYSTECTOMY     LAPAROSCOPIC APPENDECTOMY N/A 09/04/2017   Procedure: APPENDECTOMY LAPAROSCOPIC;  Surgeon:  Leafy Ro, MD;  Location: [facility] ORS;  Service: General;  Laterality: N/A;    Prior to Admission medications   Medication Sig Start Date End Date Taking? Authorizing Provider  azithromycin (ZITHROMAX Z-PAK) 250 MG tablet Take 2 tablets (500 mg) on  Day 1,  followed by 1 tablet (250 mg) once daily on Days 2 through 5. 02/07/21 02/12/21 Yes Keili Hasten, Layla Maw, DO  benzonatate (TESSALON PERLES) 100 MG capsule Take 1 capsule (100 mg total) by mouth 3 (three) times daily as needed for cough. 02/07/21 02/07/22 Yes Eldrige Pitkin, Layla Maw, DO  predniSONE (STERAPRED UNI-PAK 21 TAB) 10 MG (21) TBPK tablet Take as directed 02/07/21  Yes Malary Aylesworth, Baxter Hire N, DO  chlorhexidine (PERIDEX) 0.12 % solution Use as directed 15 mLs in the mouth or throat 2 (two) times daily. 12/20/20   Claiborne Rigg, NP  cyanocobalamin (,VITAMIN B-12,) 1000 MCG/ML injection 1000 mcg (1 mg) injection once per week for four weeks, followed by 1000 mcg injection once per month. 11/08/19   Theadore Nan, NP  naproxen (NAPROSYN) 500 MG tablet Take 1 tablet (500 mg total) by mouth 2 (two) times daily with a meal. 12/20/20   Claiborne Rigg, NP  ondansetron (ZOFRAN ODT) 4 MG disintegrating tablet Take 1 tablet (4 mg total) by mouth every 8 (eight) hours as needed. 08/18/20   Nita Sickle, MD  sertraline (ZOLOFT) 25 MG tablet TAKE 1 TABLET (25 MG TOTAL) BY MOUTH DAILY. 05/22/20   Lyndon Code, MD  albuterol (  PROVENTIL HFA;VENTOLIN HFA) 108 (90 Base) MCG/ACT inhaler Inhale 2 puffs into the lungs every 6 (six) hours as needed for wheezing or shortness of breath. Patient not taking: Reported on 07/10/2018 10/03/17 09/19/18  Nita Sickle, MD  ipratropium (ATROVENT) 0.06 % nasal spray Place 2 sprays into both nostrils 4 (four) times daily. Patient not taking: Reported on 07/10/2018 07/09/18 09/19/18  Ofilia Neas, PA-C    Allergies Sulfa antibiotics and Flagyl [metronidazole]  Family History  Problem Relation Age of Onset   Cancer Maternal  Grandmother        lung   Emphysema Maternal Grandmother    Depression Maternal Grandmother    Heart disease Maternal Grandmother    Hyperlipidemia Maternal Grandmother    Hypertension Maternal Grandmother    Stroke Maternal Grandmother    Cancer Maternal Grandfather        pancreatic   Hypertension Mother    Depression Mother    Hyperlipidemia Mother    Asthma Son    Cancer Paternal Grandmother    Cancer Paternal Grandfather    Heart disease Paternal Grandfather    Alcohol abuse Neg Hx    Arthritis Neg Hx    Birth defects Neg Hx    COPD Neg Hx    Diabetes Neg Hx    Drug abuse Neg Hx    Early death Neg Hx    Hearing loss Neg Hx    Kidney disease Neg Hx    Learning disabilities Neg Hx    Mental illness Neg Hx    Mental retardation Neg Hx    Miscarriages / Stillbirths Neg Hx    Vision loss Neg Hx    Varicose Veins Neg Hx     Social History Social History   Tobacco Use   Smoking status: Never   Smokeless tobacco: Never  Vaping Use   Vaping Use: Never used  Substance Use Topics   Alcohol use: No   Drug use: No    Review of Systems Constitutional: No fever. Eyes: No visual changes. ENT: No sore throat. Cardiovascular: + chest pain. Respiratory: Denies shortness of breath. Gastrointestinal: No nausea, vomiting, diarrhea. Genitourinary: Negative for dysuria. Musculoskeletal: Negative for back pain. Skin: Negative for rash. Neurological: Negative for focal weakness or numbness.  ____________________________________________   PHYSICAL EXAM:  VITAL SIGNS: ED Triage Vitals  Enc Vitals Group     BP 02/07/21 0001 (!) 155/80     Pulse Rate 02/07/21 0001 (!) 110     Resp 02/07/21 0001 20     Temp 02/07/21 0001 97.9 F (36.6 C)     Temp Source 02/07/21 0001 Oral     SpO2 02/07/21 0001 100 %     Weight 02/07/21 0004 193 lb (87.5 kg)     Height 02/07/21 0004 5\' 2"  (1.575 m)     Head Circumference --      Peak Flow --      Pain Score --      Pain Loc --       Pain Edu? --      Excl. in GC? --    CONSTITUTIONAL: Alert and oriented and responds appropriately to questions. Well-appearing; well-nourished HEAD: Normocephalic EYES: Conjunctivae clear, pupils appear equal, EOM appear intact ENT: normal nose; moist mucous membranes; No pharyngeal erythema or petechiae, no tonsillar hypertrophy or exudate, no uvular deviation, no unilateral swelling, no trismus or drooling, no muffled voice, normal phonation, no stridor, poor dentition with multiple dental caries, no drainable dental abscess noted, no  Ludwig's angina, tongue sits flat in the bottom of the mouth, no angioedema, no facial erythema or warmth, no facial swelling; no pain with movement of the neck, no cervical LAD. NECK: Supple, normal ROM CARD: RRR; S1 and S2 appreciated; no murmurs, no clicks, no rubs, no gallops RESP: Normal chest excursion without splinting or tachypnea; breath sounds clear and equal bilaterally; no wheezes, no rhonchi, no rales, no hypoxia or respiratory distress, speaking full sentences ABD/GI: Normal bowel sounds; non-distended; soft, non-tender, no rebound, no guarding, no peritoneal signs, no hepatosplenomegaly BACK: The back appears normal EXT: Normal ROM in all joints; no deformity noted, no edema; no cyanosis SKIN: Normal color for age and race; warm; no rash on exposed skin NEURO: Moves all extremities equally PSYCH: The patient's mood and manner are appropriate.  ____________________________________________   LABS (all labs ordered are listed, but only abnormal results are displayed)  Labs Reviewed  RESP PANEL BY RT-PCR (FLU A&B, COVID) [id number]   ____________________________________________  EKG   Date: 02/07/2021 00:12  Rate: 89  Rhythm: normal sinus rhythm  QRS Axis: normal  Intervals: normal  ST/T Wave abnormalities: normal  Conduction Disutrbances: none  Narrative Interpretation:  unremarkable    ____________________________________________  RADIOLOGY I, Reeva Davern, personally viewed and evaluated these images (plain radiographs) as part of my medical decision making, as well as reviewing the written report by the radiologist.  ED MD interpretation: Chest x-ray clear.  Official radiology report(s): DG Chest 2 View  Result Date: 02/07/2021 CLINICAL DATA:  Cough, congestion and runny nose. EXAM: CHEST - 2 VIEW COMPARISON:  August 18, 2020 FINDINGS: The heart size and mediastinal contours are within normal limits. Both lungs are clear. Radiopaque surgical clips are seen within the right upper quadrant. The visualized skeletal structures are unremarkable. IMPRESSION: No active cardiopulmonary disease. Electronically Signed   By: Aram Candela M.D.   On: 02/07/2021 01:01    ____________________________________________   PROCEDURES  Procedure(s) performed (including Critical Care):  Procedures   ____________________________________________   INITIAL IMPRESSION / ASSESSMENT AND PLAN / ED COURSE  As part of my medical decision making, I reviewed the following data within the electronic MEDICAL RECORD NUMBER Nursing notes reviewed and incorporated, Labs reviewed , EKG interpreted , Old EKG reviewed, Old chart reviewed, Radiograph reviewed , and Notes from prior ED visits         Patient here with complaints of flulike symptoms for the past month.  Has had productive cough and sinus pressure.  I suspect that this is viral in nature but given how prolonged her symptoms have been we will trial a course of antibiotics to cover for possible atypical pneumonia.  Chest x-ray today is clear and shows no pneumothorax, edema.  Her EKG is nonischemic.  Low suspicion for ACS, PE or dissection.  We will also discharge her on a steroid taper which may help with her symptoms as well as Tessalon Perles.  Discussed return precautions and supportive care instructions.  She has a PCP  for follow-up.  At this time, I do not feel there is any life-threatening condition present. I have reviewed, interpreted and discussed all results (EKG, imaging, lab, urine as appropriate) and exam findings with patient/family. I have reviewed nursing notes and appropriate previous records.  I feel the patient is safe to be discharged home without further emergent workup and can continue workup as an outpatient as needed. Discussed usual and customary return precautions. Patient/family verbalize understanding and are comfortable with this plan.  Outpatient  follow-up has been provided as needed. All questions have been answered.  ____________________________________________   FINAL CLINICAL IMPRESSION(S) / ED DIAGNOSES  Final diagnoses:  Upper respiratory tract infection, unspecified type     ED Discharge Orders          Ordered    azithromycin (ZITHROMAX Z-PAK) 250 MG tablet        02/07/21 0502    predniSONE (STERAPRED UNI-PAK 21 TAB) 10 MG (21) TBPK tablet        02/07/21 0502    benzonatate (TESSALON PERLES) 100 MG capsule  3 times daily PRN        02/07/21 0502            *Please note:  Tammy Frost was evaluated in Emergency Department on 02/07/2021 for the symptoms described in the history of present illness. She was evaluated in the context of the global COVID-19 pandemic, which necessitated consideration that the patient might be at risk for infection with the SARS-CoV-2 virus that causes COVID-19. Institutional protocols and algorithms that pertain to the evaluation of patients at risk for COVID-19 are in a state of rapid change based on information released by regulatory bodies including the CDC and federal and state organizations. These policies and algorithms were followed during the patient's care in the ED.  Some ED evaluations and interventions may be delayed as a result of limited staffing during and the pandemic.*   Note:  This document was prepared using Dragon  voice recognition software and may include unintentional dictation errors.    Arryanna Holquin, Layla Maw, DO 02/07/21 [phone]

## 2021-05-27 ENCOUNTER — Telehealth: Payer: Medicaid Other | Admitting: Nurse Practitioner

## 2021-05-27 DIAGNOSIS — K047 Periapical abscess without sinus: Secondary | ICD-10-CM

## 2021-05-27 MED ORDER — CLINDAMYCIN HCL 300 MG PO CAPS: 300.0000 mg | ORAL_CAPSULE | Freq: Four times a day (QID) | ORAL | 0 refills | Status: DC

## 2021-05-27 MED ORDER — NAPROXEN 500 MG PO TABS: 500.0000 mg | ORAL_TABLET | Freq: Two times a day (BID) | ORAL | 0 refills | Status: DC

## 2021-05-27 NOTE — Progress Notes (Signed)
E-Visit for Dental Pain ? ?We are sorry that you are not feeling well.  Here is how we plan to help! ? ?Based on what you have shared with me in the questionnaire, it sounds like you have fractured tooth with abscess ? ?naprosyn 500mg  2 times per day for 7 days for discomfort and Clindamycin 300mg  4 times per day for days ? ?It is imperative that you see a dentist within 10 days of this eVisit to determine the cause of the dental pain and be sure it is adequately treated ? ?A toothache or tooth pain is caused when the nerve in the root of a tooth or surrounding a tooth is irritated. Dental (tooth) infection, decay, injury, or loss of a tooth are the most common causes of dental pain. Pain may also occur after an extraction (tooth is pulled out). Pain sometimes originates from other areas and radiates to the jaw, thus appearing to be tooth pain.Bacteria growing inside your mouth can contribute to gum disease and dental decay, both of which can cause pain. A toothache occurs from inflammation of the central portion of the tooth called pulp. The pulp contains nerve endings that are very sensitive to pain. Inflammation to the pulp or pulpitis may be caused by dental cavities, trauma, and infection.  ? ? ?HOME CARE:  ? ?For toothaches: ?Over-the-counter pain medications such as acetaminophen or ibuprofen may be used. Take these as directed on the package while you arrange for a dental appointment. ?Avoid very cold or hot foods, because they may make the pain worse. ?You may get relief from biting on a cotton ball soaked in oil of cloves. You can get oil of cloves at most drug stores. ? ?For jaw pain: ? Aspirin may be helpful for problems in the joint of the jaw in adults. ?If pain happens every time you open your mouth widely, the temporomandibular joint (TMJ) may be the source of the pain. Yawning or taking a large bite of food may worsen the pain. An appointment with your doctor or dentist will help you find the  cause. ?  ? ? ?GET HELP RIGHT AWAY IF: ? ?You have a high fever or chills ?If you have had a recent head or face injury and develop headache, light headedness, nausea, vomiting, or other symptoms that concern you after an injury to your face or mouth, you could have a more serious injury in addition to your dental injury. ?A facial rash associated with a toothache: This condition may improve with medication. Contact your doctor for them to decide what is appropriate. ?Any jaw pain occurring with chest pain: Although jaw pain is most commonly caused by dental disease, it is sometimes referred pain from other areas. People with heart disease, especially people who have had stents placed, people with diabetes, or those who have had heart surgery may have jaw pain as a symptom of heart attack or angina. If your jaw or tooth pain is associated with lightheadedness, sweating, or shortness of breath, you should see a doctor as soon as possible. ?Trouble swallowing or excessive pain or bleeding from gums: If you have a history of a weakened immune system, diabetes, or steroid use, you may be more susceptible to infections. Infections can often be more severe and extensive or caused by unusual organisms. Dental and gum infections in people with these conditions may require more aggressive treatment. An abscess may need draining or IV antibiotics, for example. ? ?MAKE SURE YOU  ? ?Understand these  instructions. ?Will watch your condition. ?Will get help right away if you are not doing well or get worse. ? ?Thank you for choosing an e-visit. ? ?Your e-visit answers were reviewed by a board certified advanced clinical practitioner to complete your personal care plan. Depending upon the condition, your plan could have included both over the counter or prescription medications. ? ?Please review your pharmacy choice. Make sure the pharmacy is open so you can pick up prescription now. If there is a problem, you may contact your  provider through [employer] and have the prescription routed to another pharmacy.  Your safety is important to [place]. If you have drug allergies check your prescription carefully.  ? ?For the next 24 hours you can use MyChart to ask questions about today's visit, request a non-urgent call back, or ask for a work or school excuse. ?You will get an email in the next two days asking about your experience. I hope that your e-visit has been valuable and will speed your recovery. ? ? ?5-10 minutes spent reviewing and documenting in chart. ? ?

## 2021-08-18 ENCOUNTER — Other Ambulatory Visit: Payer: Self-pay

## 2021-08-18 ENCOUNTER — Ambulatory Visit (HOSPITAL_COMMUNITY)
Admission: EM | Admit: 2021-08-18 | Discharge: 2021-08-18 | Disposition: A | Discharge status: Home / Self Care | Payer: Medicaid Other | Attending: Family Medicine | Admitting: Family Medicine

## 2021-08-18 ENCOUNTER — Encounter (HOSPITAL_COMMUNITY): Payer: Self-pay | Admitting: *Deleted

## 2021-08-18 DIAGNOSIS — H60503 Unspecified acute noninfective otitis externa, bilateral: Secondary | ICD-10-CM | POA: Diagnosis not present

## 2021-08-18 MED ORDER — ONDANSETRON 4 MG PO TBDP: 4.0000 mg | ORAL_TABLET | Freq: Three times a day (TID) | ORAL | 0 refills | Status: DC | PRN

## 2021-08-18 MED ORDER — AMOXICILLIN 500 MG PO CAPS: 500.0000 mg | ORAL_CAPSULE | Freq: Three times a day (TID) | ORAL | 0 refills | Status: AC

## 2021-08-18 MED ORDER — NEOMYCIN-POLYMYXIN-HC 3.5-10000-1 OT SOLN: 4.0000 [drp] | Freq: Four times a day (QID) | OTIC | 0 refills | Status: AC

## 2021-08-18 MED ORDER — IBUPROFEN 800 MG PO TABS: 800.0000 mg | ORAL_TABLET | Freq: Three times a day (TID) | ORAL | 0 refills | Status: DC | PRN

## 2021-08-18 NOTE — ED Provider Notes (Signed)
[facility]    CSN: [id number] Arrival date & time: 08/18/21  1643      History   Chief Complaint Chief Complaint  Patient presents with   Sore Throat   Otalgia    HPI Tammy Frost is a 41 y.o. female.    Sore Throat  Otalgia Here for a 1 week history of nasal congestion and postnasal drainage.  Some mild sore throat and some cough.  And she has had bilateral ear pain right more than left.  No fever noted  No vomiting or diarrhea, but she has had some nausea  Past Medical History:  Diagnosis Date   Abdominal gas pain 12/25/2019   Anxiety    Chicken pox    Chronic cholecystitis with calculus    Depression    Gallstones    UTI (urinary tract infection) 07-20-15    Patient Active Problem List   Diagnosis Date Noted   Closed fracture of tooth 05/02/2020   URI with cough and congestion 01/25/2020   Upper respiratory tract infection 01/25/2020   Body mass index (BMI) 36.0-36.9, adult 01/25/2020   Low serum vitamin B12 01/25/2020   Vitamin D deficiency 01/25/2020   Anxiety and depression 11/08/2019   Encounter for preventative adult health care examination 11/08/2019   Situational anxiety 11/01/2019   Obesity, morbid, BMI 40.0-49.9 (HCC) 11/01/2019    Past Surgical History:  Procedure Laterality Date   APPENDECTOMY     CESAREAN SECTION  12/27/2011   CESAREAN SECTION  12/31/2013   CHOLECYSTECTOMY N/A 09/02/2015   Procedure: LAPAROSCOPIC CHOLECYSTECTOMY WITH INTRAOPERATIVE CHOLANGIOGRAM;  Surgeon: Gladis Riffle, MD;  Location: [facility] ORS;  Service: General;  Laterality: N/A;   CHOLECYSTECTOMY     LAPAROSCOPIC APPENDECTOMY N/A 09/04/2017   Procedure: APPENDECTOMY LAPAROSCOPIC;  Surgeon: Leafy Ro, MD;  Location: [facility] ORS;  Service: General;  Laterality: N/A;    OB History     Gravida  1   Para      Term      Preterm      AB      Living         SAB      IAB      Ectopic      Multiple      Live Births                Home Medications    Prior to Admission medications   Medication Sig Start Date End Date Taking? Authorizing Provider  amoxicillin (AMOXIL) 500 MG capsule Take 1 capsule (500 mg total) by mouth 3 (three) times daily for 7 days. 08/18/21 08/25/21 Yes Kahli Mayon, Janace Aris, MD  ibuprofen (ADVIL) 800 MG tablet Take 1 tablet (800 mg total) by mouth every 8 (eight) hours as needed (pain). 08/18/21  Yes Lydiann Bonifas, Janace Aris, MD  neomycin-polymyxin-hydrocortisone (CORTISPORIN) OTIC solution Place 4 drops into the right ear 4 (four) times daily for 7 days. 08/18/21 08/25/21 Yes Cruz Bong, Janace Aris, MD  chlorhexidine (PERIDEX) 0.12 % solution Use as directed 15 mLs in the mouth or throat 2 (two) times daily. 12/20/20   Claiborne Rigg, NP  cyanocobalamin (,VITAMIN B-12,) 1000 MCG/ML injection 1000 mcg (1 mg) injection once per week for four weeks, followed by 1000 mcg injection once per month. 11/08/19   Theadore Nan, NP  ondansetron (ZOFRAN ODT) 4 MG disintegrating tablet Take 1 tablet (4 mg total) by mouth every 8 (eight) hours as needed for nausea or vomiting. 08/18/21  Zenia Resides, MD  sertraline (ZOLOFT) 25 MG tablet TAKE 1 TABLET (25 MG TOTAL) BY MOUTH DAILY. 05/22/20   Lyndon Code, MD  albuterol (PROVENTIL HFA;VENTOLIN HFA) 108 (90 Base) MCG/ACT inhaler Inhale 2 puffs into the lungs every 6 (six) hours as needed for wheezing or shortness of breath. Patient not taking: Reported on 07/10/2018 10/03/17 09/19/18  Nita Sickle, MD  ipratropium (ATROVENT) 0.06 % nasal spray Place 2 sprays into both nostrils 4 (four) times daily. Patient not taking: Reported on 07/10/2018 07/09/18 09/19/18  Ofilia Neas, PA-C    Family History Family History  Problem Relation Age of Onset   Cancer Maternal Grandmother        lung   Emphysema Maternal Grandmother    Depression Maternal Grandmother    Heart disease Maternal Grandmother    Hyperlipidemia Maternal Grandmother    Hypertension Maternal Grandmother     Stroke Maternal Grandmother    Cancer Maternal Grandfather        pancreatic   Hypertension Mother    Depression Mother    Hyperlipidemia Mother    Asthma Son    Cancer Paternal Grandmother    Cancer Paternal Grandfather    Heart disease Paternal Grandfather    Alcohol abuse Neg Hx    Arthritis Neg Hx    Birth defects Neg Hx    COPD Neg Hx    Diabetes Neg Hx    Drug abuse Neg Hx    Early death Neg Hx    Hearing loss Neg Hx    Kidney disease Neg Hx    Learning disabilities Neg Hx    Mental illness Neg Hx    Mental retardation Neg Hx    Miscarriages / Stillbirths Neg Hx    Vision loss Neg Hx    Varicose Veins Neg Hx     Social History Social History   Tobacco Use   Smoking status: Never   Smokeless tobacco: Never  Vaping Use   Vaping Use: Never used  Substance Use Topics   Alcohol use: No   Drug use: No     Allergies   Sulfa antibiotics and Flagyl [metronidazole]   Review of Systems Review of Systems  HENT:  Positive for ear pain.     Physical Exam Triage Vital Signs ED Triage Vitals [08/18/21 1736]  Enc Vitals Group     BP 123/85     Pulse Rate 89     Resp 18     Temp 98.5 F (36.9 C)     Temp src      SpO2 98 %     Weight      Height      Head Circumference      Peak Flow      Pain Score 7     Pain Loc      Pain Edu?      Excl. in GC?    No data found.  Updated Vital Signs BP 123/85   Pulse 89   Temp 98.5 F (36.9 C)   Resp 18   LMP 08/13/2021   SpO2 98%   Visual Acuity Right Eye Distance:   Left Eye Distance:   Bilateral Distance:    Right Eye Near:   Left Eye Near:    Bilateral Near:     Physical Exam Vitals reviewed.  Constitutional:      General: She is not in acute distress.    Appearance: She is not toxic-appearing.  HENT:  Right Ear: Tympanic membrane normal.     Left Ear: Tympanic membrane normal.     Ears:     Comments: Tympanic membrane's are bilaterally gray and shiny.  There is some mild swelling of  the ear canals bilaterally.  She is also very tender of her tragus and pinna    Nose: Nose normal.     Mouth/Throat:     Mouth: Mucous membranes are moist.     Pharynx: No oropharyngeal exudate or posterior oropharyngeal erythema.     Comments: No erythema of the oropharynx.  Dentition is poor Eyes:     Extraocular Movements: Extraocular movements intact.     Conjunctiva/sclera: Conjunctivae normal.     Pupils: Pupils are equal, round, and reactive to light.  Cardiovascular:     Rate and Rhythm: Normal rate and regular rhythm.     Heart sounds: No murmur heard. Pulmonary:     Effort: Pulmonary effort is normal. No respiratory distress.     Breath sounds: No wheezing, rhonchi or rales.  Musculoskeletal:     Cervical back: Neck supple.  Lymphadenopathy:     Cervical: No cervical adenopathy.  Skin:    Capillary Refill: Capillary refill takes less than 2 seconds.     Coloration: Skin is not jaundiced or pale.  Neurological:     General: No focal deficit present.     Mental Status: She is alert and oriented to person, place, and time.  Psychiatric:        Behavior: Behavior normal.     UC Treatments / Results  Labs (all labs ordered are listed, but only abnormal results are displayed) Labs Reviewed - No data to display  EKG   Radiology No results found.  Procedures Procedures (including critical care time)  Medications Ordered in UC Medications - No data to display  Initial Impression / Assessment and Plan / UC Course  I have reviewed the triage vital signs and the nursing notes.  Pertinent labs & imaging results that were available during my care of the patient were reviewed by me and considered in my medical decision making (see chart for details).     I will treat for otitis externa and possible sinusitis Final Clinical Impressions(s) / UC Diagnoses   Final diagnoses:  Acute otitis externa of both ears, unspecified type     Discharge Instructions       Take amoxicillin 500 mg--1 tab3 times daily for 7 days  Take ibuprofen 800 mg--1 tab every 8 hours as needed for pain.   Put Cortisporin drops in your ears 4 times daily for 7 days  Ondansetron dissolved in the mouth every 8 hours as needed for nausea or vomiting.        ED Prescriptions     Medication Sig Dispense Auth. Provider   ondansetron (ZOFRAN ODT) 4 MG disintegrating tablet Take 1 tablet (4 mg total) by mouth every 8 (eight) hours as needed for nausea or vomiting. 10 tablet Zenia ResidesBanister, Venesa Semidey K, MD   amoxicillin (AMOXIL) 500 MG capsule Take 1 capsule (500 mg total) by mouth 3 (three) times daily for 7 days. 21 capsule Simon Aaberg, Janace ArisPamela K, MD   neomycin-polymyxin-hydrocortisone (CORTISPORIN) OTIC solution Place 4 drops into the right ear 4 (four) times daily for 7 days. 10 mL Zenia ResidesBanister, Mohamadou Maciver K, MD   ibuprofen (ADVIL) 800 MG tablet Take 1 tablet (800 mg total) by mouth every 8 (eight) hours as needed (pain). 21 tablet Marlinda MikeBanister, Janace ArisPamela K, MD  PDMP not reviewed this encounter.   Zenia Resides, MD 08/18/21 [phone]

## 2021-08-18 NOTE — ED Triage Notes (Signed)
T reports she has Rt ear pain and a sore throat.

## 2021-08-18 NOTE — Discharge Instructions (Addendum)
Take amoxicillin 500 mg--1 tab3 times daily for 7 days  Take ibuprofen 800 mg--1 tab every 8 hours as needed for pain.   Put Cortisporin drops in your ears 4 times daily for 7 days  Ondansetron dissolved in the mouth every 8 hours as needed for nausea or vomiting.

## 2021-09-05 ENCOUNTER — Encounter: Payer: Self-pay | Admitting: Emergency Medicine

## 2021-09-05 ENCOUNTER — Other Ambulatory Visit: Payer: Self-pay

## 2021-09-05 ENCOUNTER — Emergency Department
Admission: EM | Admit: 2021-09-05 | Discharge: 2021-09-05 | Disposition: A | Discharge status: Home / Self Care | Payer: Medicaid Other | Attending: Student in an Organized Health Care Education/Training Program | Admitting: Student in an Organized Health Care Education/Training Program

## 2021-09-05 DIAGNOSIS — R1013 Epigastric pain: Secondary | ICD-10-CM | POA: Insufficient documentation

## 2021-09-05 DIAGNOSIS — R112 Nausea with vomiting, unspecified: Secondary | ICD-10-CM | POA: Insufficient documentation

## 2021-09-05 DIAGNOSIS — R197 Diarrhea, unspecified: Secondary | ICD-10-CM | POA: Insufficient documentation

## 2021-09-05 LAB — COMPREHENSIVE METABOLIC PANEL
ALT: 21 U/L (ref 0–44)
AST: 23 U/L (ref 15–41)
Albumin: 4.1 g/dL (ref 3.5–5.0)
Alkaline Phosphatase: 49 U/L (ref 38–126)
Anion gap: 8 (ref 5–15)
BUN: 12 mg/dL (ref 6–20)
CO2: 20 mmol/L — ABNORMAL LOW (ref 22–32)
Calcium: 9.4 mg/dL (ref 8.9–10.3)
Chloride: 107 mmol/L (ref 98–111)
Creatinine, Ser: 0.92 mg/dL (ref 0.44–1.00)
GFR, Estimated: >60 mL/min (ref >60)
Glucose, Bld: 110 mg/dL — ABNORMAL HIGH (ref 70–99)
Potassium: 3.2 mmol/L — ABNORMAL LOW (ref 3.5–5.1)
Sodium: 135 mmol/L (ref 135–145)
Total Bilirubin: 0.5 mg/dL (ref 0.3–1.2)
Total Protein: 8.3 g/dL — ABNORMAL HIGH (ref 6.5–8.1)

## 2021-09-05 LAB — URINALYSIS, ROUTINE W REFLEX MICROSCOPIC
Bilirubin Urine: NEGATIVE
Glucose, UA: NEGATIVE mg/dL
Ketones, ur: 5 mg/dL — AB
Leukocytes,Ua: NEGATIVE
Nitrite: NEGATIVE
Protein, ur: 30 mg/dL — AB
Specific Gravity, Urine: 1.028 (ref 1.005–1.030)
pH: 5 (ref 5.0–8.0)

## 2021-09-05 LAB — CBC
HCT: 42.1 % (ref 36.0–46.0)
Hemoglobin: 13.5 g/dL (ref 12.0–15.0)
MCH: 26.7 pg (ref 26.0–34.0)
MCHC: 32.1 g/dL (ref 30.0–36.0)
MCV: 83.4 fL (ref 80.0–100.0)
Platelets: 203 10*3/uL (ref 150–400)
RBC: 5.05 MIL/uL (ref 3.87–5.11)
RDW: 12.6 % (ref 11.5–15.5)
WBC: 4 10*3/uL (ref 4.0–10.5)
nRBC: 0 % (ref 0.0–0.2)

## 2021-09-05 LAB — POC URINE PREG, ED: Preg Test, Ur: NEGATIVE

## 2021-09-05 LAB — LIPASE, BLOOD: Lipase: 24 U/L (ref 11–51)

## 2021-09-05 IMAGING — CR DG CHEST 2V
1 series · 2 of 2 positions shown · non-contrast
Comparison: Chest x-ray 05/27/2018.

CLINICAL DATA: 38-year-old female with history of chest pain
intermittently for the past 2 days.

EXAM:
CHEST - 2 VIEW

[Series 1: dg chest 2 view · 0.14mm/px · 2 of 2 slices shown]
[im 1/2]
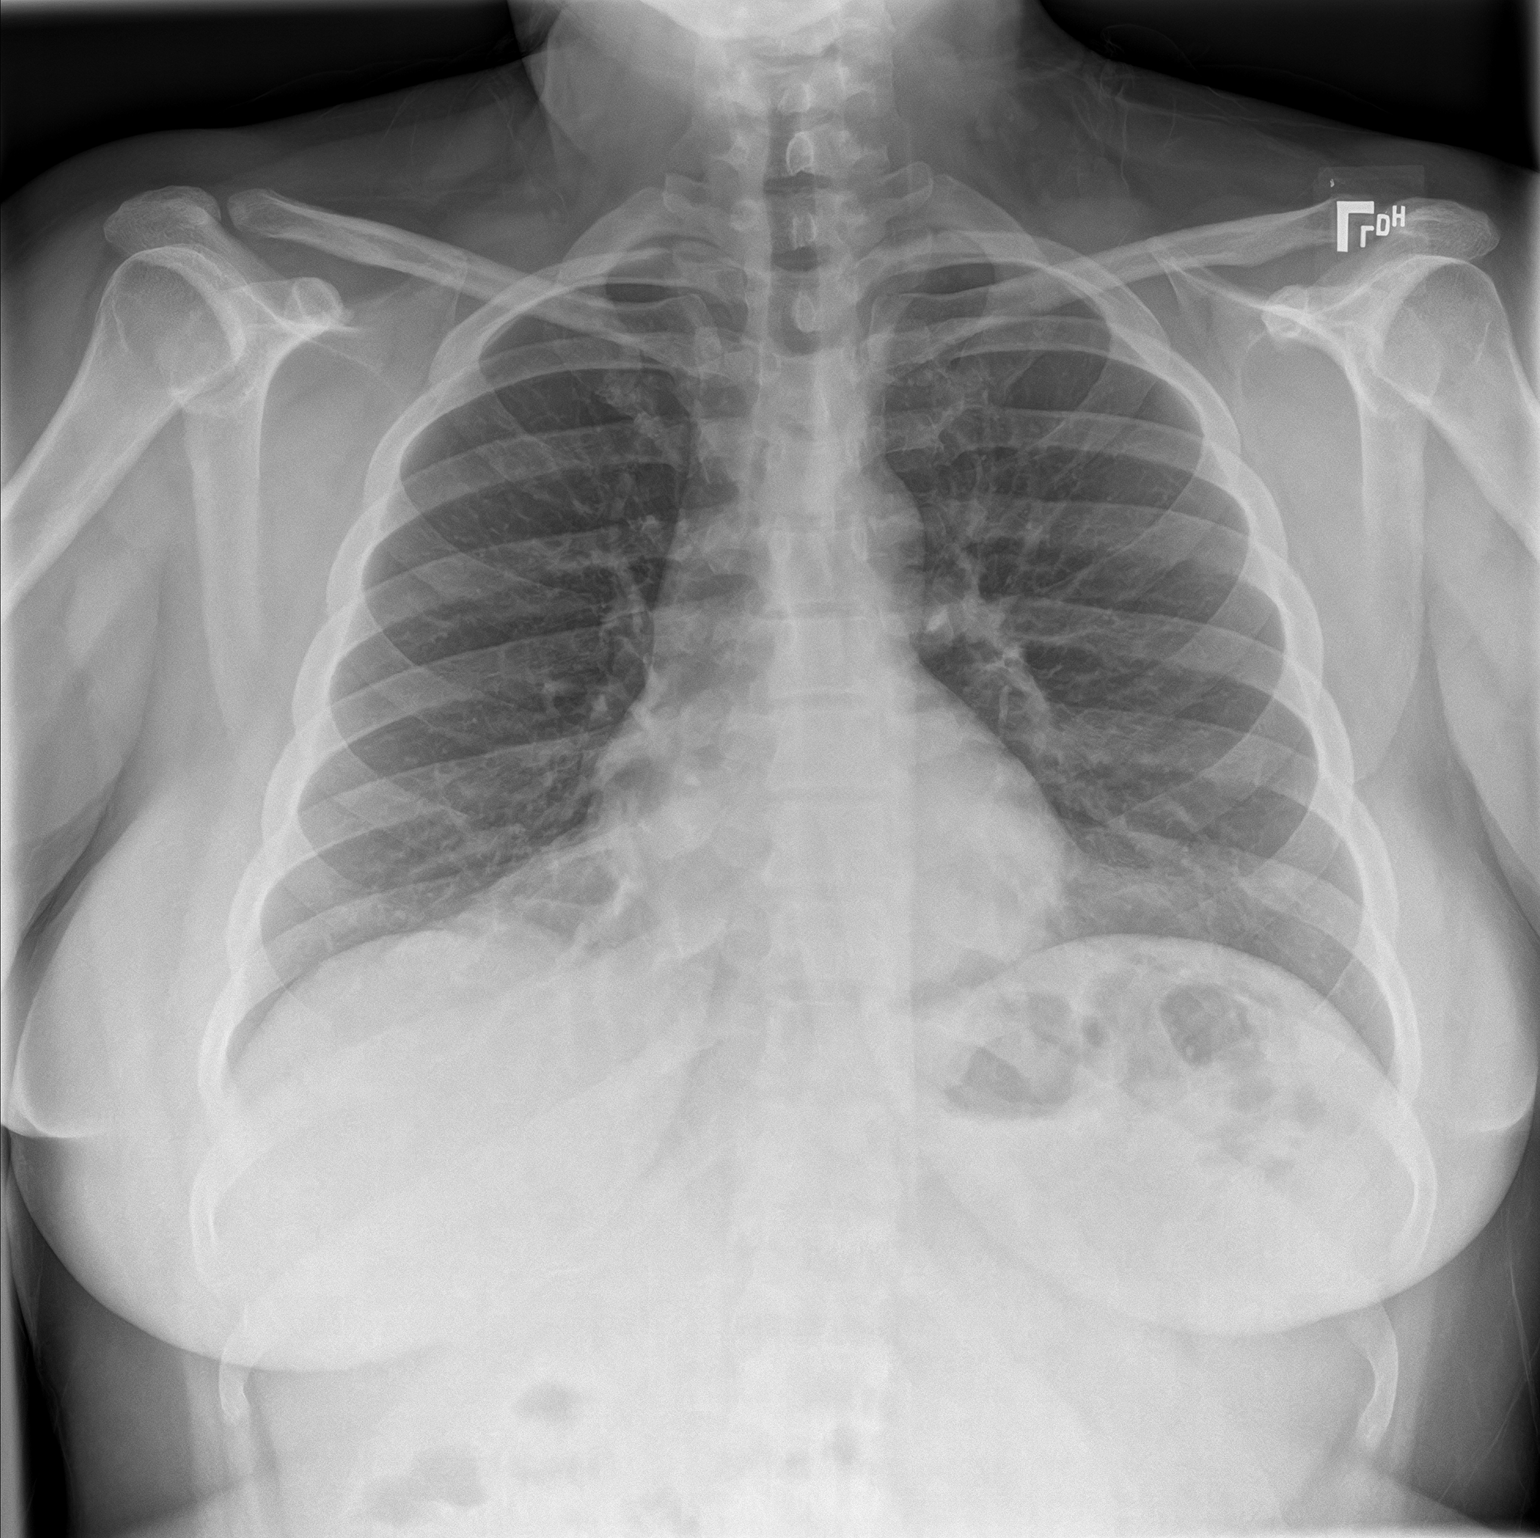
[im 2/2]
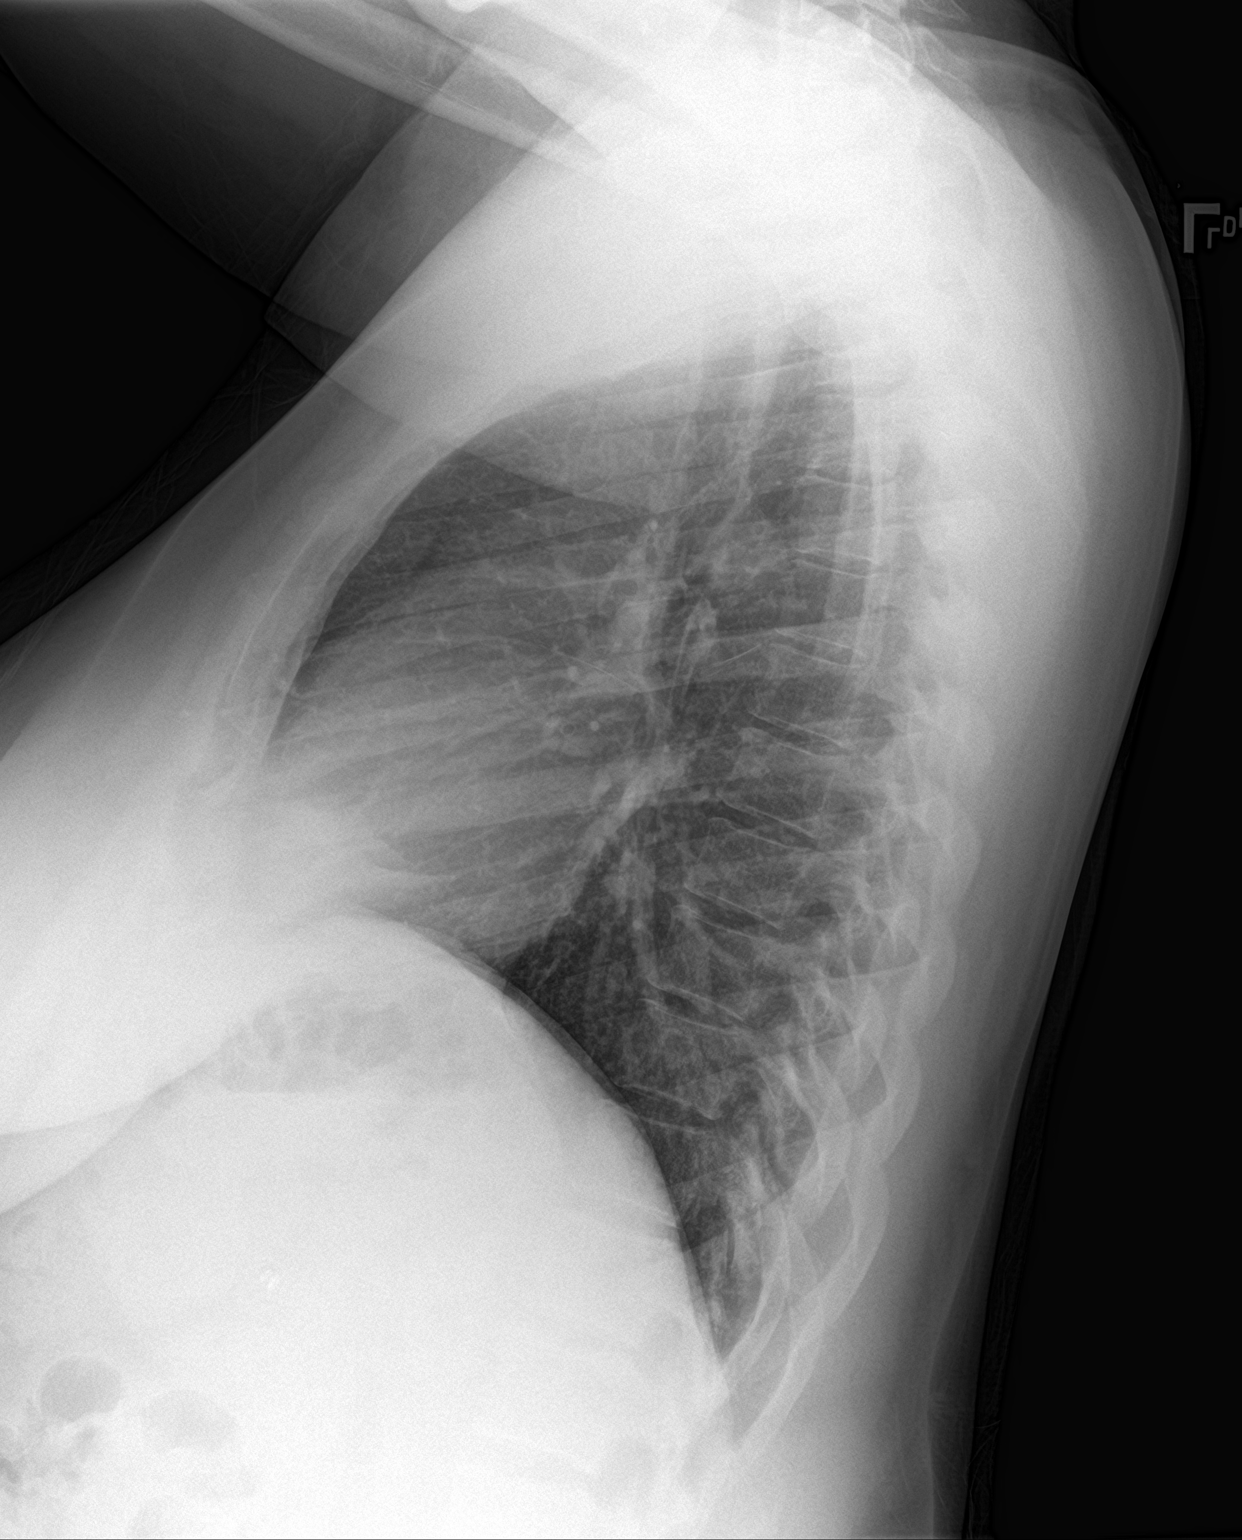

[2 of 2 positions shown; findings below may reference images not displayed]

FINDINGS: Lung volumes are normal. No consolidative airspace disease. No
pleural effusions. No pneumothorax. No pulmonary nodule or mass
noted. Pulmonary vasculature and the cardiomediastinal silhouette
are within normal limits.
IMPRESSION: No radiographic evidence of acute cardiopulmonary disease.

## 2021-09-05 MED ORDER — ONDANSETRON 4 MG PO TBDP: 4.0000 mg | ORAL_TABLET | Freq: Three times a day (TID) | ORAL | 0 refills | Status: DC | PRN

## 2021-09-05 MED ORDER — ONDANSETRON 4 MG PO TBDP
4.0000 mg | ORAL_TABLET | Freq: Once | ORAL | Status: AC
  Administered 2021-09-05: 4 mg via ORAL
  Filled 2021-09-05: qty 1

## 2021-09-05 MED ORDER — POTASSIUM CHLORIDE CRYS ER 20 MEQ PO TBCR
40.0000 meq | EXTENDED_RELEASE_TABLET | Freq: Once | ORAL | Status: AC
  Administered 2021-09-05: 40 meq via ORAL
  Filled 2021-09-05: qty 2

## 2021-09-07 ENCOUNTER — Telehealth: Payer: Self-pay

## 2021-09-11 ENCOUNTER — Encounter: Payer: Self-pay | Admitting: Physician Assistant

## 2021-09-11 ENCOUNTER — Ambulatory Visit: Payer: Medicaid Other | Admitting: Physician Assistant

## 2021-09-11 VITALS — BP 122/84 | HR 73 | Temp 98.0°F | Resp 16 | Ht 62.0 in | Wt 208.0 lb

## 2021-09-11 DIAGNOSIS — R7989 Other specified abnormal findings of blood chemistry: Secondary | ICD-10-CM | POA: Diagnosis not present

## 2021-09-11 DIAGNOSIS — E559 Vitamin D deficiency, unspecified: Secondary | ICD-10-CM

## 2021-09-11 DIAGNOSIS — E538 Deficiency of other specified B group vitamins: Secondary | ICD-10-CM | POA: Diagnosis not present

## 2021-09-11 DIAGNOSIS — R5383 Other fatigue: Secondary | ICD-10-CM | POA: Diagnosis not present

## 2021-09-11 DIAGNOSIS — E876 Hypokalemia: Secondary | ICD-10-CM

## 2021-09-11 NOTE — Progress Notes (Signed)
[facility] [address] [place], [place] [address]  Internal MEDICINE  Office Visit Note  Patient Name: Tammy Frost  [id number]  [id number]  Date of Service: 09/11/2021  Chief Complaint  Patient presents with   Follow-up    ED ,potassium recheck   Depression   Anxiety    HPI Pt is here for ED follow up -Went to ED for vomiting and diarrhea on 09/05/21 -She was on day 3 of her symptoms when she went to ED and was given nausea med and then some crackers to see if she could tolerate. She was able to tolerate liquids and was not started on IV fluids.  -Labs were performed and she was found to have low potassium of 3.2 and was given oral tab in ED and told to have labs rechecked -She was discharged with zofran to take as needed. -She is now able to eat without nausea medication and is feeling much better -She is overdue for CPE with routine labs and will add these to be done with potassium recheck  Current Medication: Outpatient Encounter Medications as of 09/11/2021  Medication Sig   chlorhexidine (PERIDEX) 0.12 % solution Use as directed 15 mLs in the mouth or throat 2 (two) times daily.   cyanocobalamin (,VITAMIN B-12,) 1000 MCG/ML injection 1000 mcg (1 mg) injection once per week for four weeks, followed by 1000 mcg injection once per month.   ibuprofen (ADVIL) 800 MG tablet Take 1 tablet (800 mg total) by mouth every 8 (eight) hours as needed (pain).   ondansetron (ZOFRAN ODT) 4 MG disintegrating tablet Take 1 tablet (4 mg total) by mouth every 8 (eight) hours as needed for nausea or vomiting.   sertraline (ZOLOFT) 25 MG tablet TAKE 1 TABLET (25 MG TOTAL) BY MOUTH DAILY.   [DISCONTINUED] albuterol (PROVENTIL HFA;VENTOLIN HFA) 108 (90 Base) MCG/ACT inhaler Inhale 2 puffs into the lungs every 6 (six) hours as needed for wheezing or shortness of breath. (Patient not taking: Reported on 07/10/2018)   [DISCONTINUED] ipratropium (ATROVENT) 0.06 % nasal spray Place 2 sprays  into both nostrils 4 (four) times daily. (Patient not taking: Reported on 07/10/2018)   No facility-administered encounter medications on file as of 09/11/2021.    Surgical History: Past Surgical History:  Procedure Laterality Date   APPENDECTOMY     CESAREAN SECTION  12/27/2011   CESAREAN SECTION  12/31/2013   CHOLECYSTECTOMY N/A 09/02/2015   Procedure: LAPAROSCOPIC CHOLECYSTECTOMY WITH INTRAOPERATIVE CHOLANGIOGRAM;  Surgeon: Gladis Riffle, MD;  Location: [facility] ORS;  Service: General;  Laterality: N/A;   CHOLECYSTECTOMY     LAPAROSCOPIC APPENDECTOMY N/A 09/04/2017   Procedure: APPENDECTOMY LAPAROSCOPIC;  Surgeon: Leafy Ro, MD;  Location: [facility] ORS;  Service: General;  Laterality: N/A;    Medical History: Past Medical History:  Diagnosis Date   Abdominal gas pain 12/25/2019   Anxiety    Chicken pox    Chronic cholecystitis with calculus    Depression    Gallstones    UTI (urinary tract infection) 07-20-15    Family History: Family History  Problem Relation Age of Onset   Cancer Maternal Grandmother        lung   Emphysema Maternal Grandmother    Depression Maternal Grandmother    Heart disease Maternal Grandmother    Hyperlipidemia Maternal Grandmother    Hypertension Maternal Grandmother    Stroke Maternal Grandmother    Cancer Maternal Grandfather        pancreatic   Hypertension Mother  Depression Mother    Hyperlipidemia Mother    Asthma Son    Cancer Paternal Grandmother    Cancer Paternal Grandfather    Heart disease Paternal Grandfather    Alcohol abuse Neg Hx    Arthritis Neg Hx    Birth defects Neg Hx    COPD Neg Hx    Diabetes Neg Hx    Drug abuse Neg Hx    Early death Neg Hx    Hearing loss Neg Hx    Kidney disease Neg Hx    Learning disabilities Neg Hx    Mental illness Neg Hx    Mental retardation Neg Hx    Miscarriages / Stillbirths Neg Hx    Vision loss Neg Hx    Varicose Veins Neg Hx     Social History   Socioeconomic History    Marital status: Single    Spouse name: Not on file   Number of children: Not on file   Years of education: Not on file   Highest education level: Not on file  Occupational History   Occupation: unemployed  Tobacco Use   Smoking status: Never   Smokeless tobacco: Never  Vaping Use   Vaping Use: Never used  Substance and Sexual Activity   Alcohol use: No   Drug use: No   Sexual activity: Yes    Birth control/protection: None  Other Topics Concern   Not on file  Social History Narrative   Single has  boyfriend and lives with mother Has 2 boys    Social Determinants of Health   Financial Resource Strain: Not on file  Food Insecurity: Not on file  Transportation Needs: Not on file  Physical Activity: Not on file  Stress: Not on file  Social Connections: Not on file  Intimate Partner Violence: Not on file      Review of Systems  Constitutional:  Negative for chills, fatigue and unexpected weight change.  HENT:  Negative for congestion, postnasal drip, rhinorrhea, sneezing and sore throat.   Eyes:  Negative for redness.  Respiratory:  Negative for cough, chest tightness and shortness of breath.   Cardiovascular:  Negative for chest pain and palpitations.  Gastrointestinal:  Negative for abdominal pain, constipation, diarrhea, nausea and vomiting.  Genitourinary:  Negative for dysuria and frequency.  Musculoskeletal:  Negative for arthralgias, back pain, joint swelling and neck pain.  Skin:  Negative for rash.  Neurological: Negative.  Negative for tremors and numbness.  Hematological:  Negative for adenopathy. Does not bruise/bleed easily.  Psychiatric/Behavioral:  Negative for behavioral problems (Depression), sleep disturbance and suicidal ideas. The patient is not nervous/anxious.     Vital Signs: BP 122/84   Pulse 73   Temp 98 F (36.7 C)   Resp 16   Ht 5\' 2"  (1.575 m)   Wt 208 lb (94.3 kg)   LMP 08/31/2021   SpO2 99%   BMI 38.04 kg/m    Physical  Exam Vitals and nursing note reviewed.  Constitutional:      General: She is not in acute distress.    Appearance: She is well-developed. She is not diaphoretic.  HENT:     Head: Normocephalic and atraumatic.     Mouth/Throat:     Pharynx: No oropharyngeal exudate.  Eyes:     Pupils: Pupils are equal, round, and reactive to light.  Neck:     Thyroid: No thyromegaly.     Vascular: No JVD.     Trachea: No tracheal deviation.  Cardiovascular:     Rate and Rhythm: Normal rate and regular rhythm.     Heart sounds: Normal heart sounds. No murmur heard.    No friction rub. No gallop.  Pulmonary:     Effort: Pulmonary effort is normal. No respiratory distress.     Breath sounds: No wheezing or rales.  Chest:     Chest wall: No tenderness.  Abdominal:     General: Bowel sounds are normal.     Palpations: Abdomen is soft.  Musculoskeletal:        General: Normal range of motion.     Cervical back: Normal range of motion and neck supple.  Lymphadenopathy:     Cervical: No cervical adenopathy.  Skin:    General: Skin is warm and dry.  Neurological:     Mental Status: She is alert and oriented to person, place, and time.     Cranial Nerves: No cranial nerve deficit.  Psychiatric:        Behavior: Behavior normal.        Thought Content: Thought content normal.        Judgment: Judgment normal.        Assessment/Plan: 1. Low serum potassium Will recheck labs - Comprehensive metabolic panel  2. B12 deficiency - B12 and Folate Panel  3. Vitamin D deficiency - VITAMIN D 25 Hydroxy (Vit-D Deficiency, Fractures)  4. Abnormal thyroid blood test - TSH + free T4  5. Other fatigue - Comprehensive metabolic panel - G50 and Folate Panel - VITAMIN D 25 Hydroxy (Vit-D Deficiency, Fractures) - Lipid Panel With LDL/HDL Ratio - TSH + free T4   General Counseling: Adler verbalizes understanding of the findings of todays visit and agrees with plan of treatment. I have discussed  any further diagnostic evaluation that may be needed or ordered today. We also reviewed her medications today. she has been encouraged to call the office with any questions or concerns that should arise related to todays visit.    Orders Placed This Encounter  Procedures   Comprehensive metabolic panel   I37 and Folate Panel   VITAMIN D 25 Hydroxy (Vit-D Deficiency, Fractures)   Lipid Panel With LDL/HDL Ratio   TSH + free T4    No orders of the defined types were placed in this encounter.   This patient was seen by Lynn Ito, PA-C in collaboration with Dr. Beverely Risen as a part of collaborative care agreement.   Total time spent:30 Minutes Time spent includes review of chart, medications, test results, and follow up plan with the patient.      Dr Lyndon Code Internal medicine

## 2021-09-12 LAB — COMPREHENSIVE METABOLIC PANEL
ALT: 14 IU/L (ref 0–32)
AST: 11 IU/L (ref 0–40)
Albumin/Globulin Ratio: 1.5 (ref 1.2–2.2)
Albumin: 4 g/dL (ref 3.8–4.8)
Alkaline Phosphatase: 46 IU/L (ref 44–121)
BUN/Creatinine Ratio: 9 (ref 9–23)
BUN: 9 mg/dL (ref 6–24)
Bilirubin Total: 0.3 mg/dL (ref 0.0–1.2)
CO2: 20 mmol/L (ref 20–29)
Calcium: 8.9 mg/dL (ref 8.7–10.2)
Chloride: 105 mmol/L (ref 96–106)
Creatinine, Ser: 0.95 mg/dL (ref 0.57–1.00)
Globulin, Total: 2.7 g/dL (ref 1.5–4.5)
Glucose: 97 mg/dL (ref 70–99)
Potassium: 4.2 mmol/L (ref 3.5–5.2)
Sodium: 140 mmol/L (ref 134–144)
Total Protein: 6.7 g/dL (ref 6.0–8.5)
eGFR: 78 mL/min/{1.73_m2} (ref >59)

## 2021-09-12 LAB — B12 AND FOLATE PANEL
Folate: 4 ng/mL (ref >3.0)
Vitamin B-12: 257 pg/mL (ref 232–1245)

## 2021-09-12 LAB — TSH+FREE T4
Free T4: 1.2 ng/dL (ref 0.82–1.77)
TSH: 1.34 u[IU]/mL (ref 0.450–4.500)

## 2021-09-12 LAB — LIPID PANEL WITH LDL/HDL RATIO
Cholesterol, Total: 139 mg/dL (ref 100–199)
HDL: 31 mg/dL — ABNORMAL LOW (ref >39)
LDL Chol Calc (NIH): 86 mg/dL (ref 0–99)
LDL/HDL Ratio: 2.8 ratio (ref 0.0–3.2)
Triglycerides: 121 mg/dL (ref 0–149)
VLDL Cholesterol Cal: 22 mg/dL (ref 5–40)

## 2021-09-12 LAB — VITAMIN D 25 HYDROXY (VIT D DEFICIENCY, FRACTURES): Vit D, 25-Hydroxy: 16.6 ng/mL — ABNORMAL LOW (ref 30.0–100.0)

## 2021-09-18 ENCOUNTER — Telehealth: Payer: Self-pay

## 2021-09-18 NOTE — Telephone Encounter (Signed)
LMOM for pt to return call for lab results

## 2021-09-18 NOTE — Telephone Encounter (Signed)
-----   Message from Lauren K McDonough, PA-C sent at 09/16/2021  3:06 PM EDT ----- Please let her know that her Vit D is very low--can send drisdol, b12 also low and may start B12 injections weekly x3 then monthly. Her potassium is back to normal 

## 2021-09-21 ENCOUNTER — Telehealth: Payer: Self-pay

## 2021-09-21 MED ORDER — ERGOCALCIFEROL 1.25 MG (50000 UT) PO CAPS: 50000.0000 [IU] | ORAL_CAPSULE | ORAL | 1 refills | Status: DC

## 2021-09-21 NOTE — Telephone Encounter (Signed)
LMOM for pt to return call to discuss lab results 

## 2021-09-21 NOTE — Telephone Encounter (Signed)
-----   Message from Lauren K McDonough, PA-C sent at 09/16/2021  3:06 PM EDT ----- Please let her know that her Vit D is very low--can send drisdol, b12 also low and may start B12 injections weekly x3 then monthly. Her potassium is back to normal 

## 2021-09-21 NOTE — Telephone Encounter (Signed)
Pt called back and I informed her of lab results and that we will send the drisdol to the pharmacy and also pt was transferred to appt desk to schedule b12 nurse visit for once a week for 3 weeks.

## 2021-09-21 NOTE — Telephone Encounter (Signed)
-----   Message from Carlean Jews, PA-C sent at 09/16/2021  3:06 PM EDT ----- Please let her know that her Vit D is very low--can send drisdol, b12 also low and may start B12 injections weekly x3 then monthly. Her potassium is back to normal

## 2021-09-22 ENCOUNTER — Ambulatory Visit (INDEPENDENT_AMBULATORY_CARE_PROVIDER_SITE_OTHER): Payer: Medicaid Other

## 2021-09-22 DIAGNOSIS — E538 Deficiency of other specified B group vitamins: Secondary | ICD-10-CM

## 2021-09-22 MED ORDER — CYANOCOBALAMIN 1000 MCG/ML IJ SOLN
1000.0000 ug | Freq: Once | INTRAMUSCULAR | Status: AC
  Administered 2021-09-22: 1000 ug via INTRAMUSCULAR

## 2021-09-29 ENCOUNTER — Ambulatory Visit: Payer: Medicaid Other

## 2021-10-01 ENCOUNTER — Ambulatory Visit: Payer: Medicaid Other

## 2021-10-06 ENCOUNTER — Ambulatory Visit: Payer: Medicaid Other

## 2021-10-21 ENCOUNTER — Encounter (INDEPENDENT_AMBULATORY_CARE_PROVIDER_SITE_OTHER): Payer: Self-pay

## 2021-11-03 ENCOUNTER — Ambulatory Visit: Payer: Medicaid Other

## 2021-11-09 ENCOUNTER — Telehealth: Payer: Medicaid Other | Admitting: Nurse Practitioner

## 2021-11-09 DIAGNOSIS — J069 Acute upper respiratory infection, unspecified: Secondary | ICD-10-CM

## 2021-11-09 MED ORDER — FLUTICASONE PROPIONATE 50 MCG/ACT NA SUSP: 2.0000 | Freq: Every day | NASAL | 6 refills | Status: DC

## 2021-11-09 NOTE — Progress Notes (Signed)
E-Visit for Sinus Problems  We are sorry that you are not feeling well.  Here is how we plan to help!  Prior to considering antibiotics it is suggested that you try over the counter decongestants to help with the ear pain that is likely from sinus congestion.   You should also consider being tested for COVID since you have been exposed.   Based on what you have shared with me it looks like you have sinusitis.  Sinusitis is inflammation and infection in the sinus cavities of the head.  Based on your presentation I believe you most likely have Acute Viral Sinusitis.This is an infection most likely caused by a virus. There is not specific treatment for viral sinusitis other than to help you with the symptoms until the infection runs its course.  You may use an oral decongestant such as Mucinex D or if you have glaucoma or high blood pressure use plain Mucinex. Saline nasal spray help and can safely be used as often as needed for congestion, I have prescribed: Fluticasone nasal spray two sprays in each nostril once a day  Some authorities believe that zinc sprays or the use of Echinacea may shorten the course of your symptoms.  Sinus infections are not as easily transmitted as other respiratory infection, however we still recommend that you avoid close contact with loved ones, especially the very young and elderly.  Remember to wash your hands thoroughly throughout the day as this is the number one way to prevent the spread of infection!  Home Care: Only take medications as instructed by your medical team. Do not take these medications with alcohol. A steam or ultrasonic humidifier can help congestion.  You can place a towel over your head and breathe in the steam from hot water coming from a faucet. Avoid close contacts especially the very young and the elderly. Cover your mouth when you cough or sneeze. Always remember to wash your hands.  Get Help Right Away If: You develop worsening fever or  sinus pain. You develop a severe head ache or visual changes. Your symptoms persist after you have completed your treatment plan.  Make sure you Understand these instructions. Will watch your condition. Will get help right away if you are not doing well or get worse.   Thank you for choosing an e-visit.  Your e-visit answers were reviewed by a board certified advanced clinical practitioner to complete your personal care plan. Depending upon the condition, your plan could have included both over the counter or prescription medications.  Please review your pharmacy choice. Make sure the pharmacy is open so you can pick up prescription now. If there is a problem, you may contact your provider through [employer] and have the prescription routed to another pharmacy.  Your safety is important to [place]. If you have drug allergies check your prescription carefully.   For the next 24 hours you can use MyChart to ask questions about today's visit, request a non-urgent call back, or ask for a work or school excuse. You will get an email in the next two days asking about your experience. I hope that your e-visit has been valuable and will speed your recovery.  I spent approximately 5 minutes reviewing the patient's history, current symptoms and coordinating their plan of care today.    Meds ordered this encounter  Medications   fluticasone (FLONASE) 50 MCG/ACT nasal spray    Sig: Place 2 sprays into both nostrils daily.    Dispense:  16 g    Refill:  6

## 2021-11-11 ENCOUNTER — Telehealth: Payer: Medicaid Other | Admitting: Family Medicine

## 2021-11-11 DIAGNOSIS — B9789 Other viral agents as the cause of diseases classified elsewhere: Secondary | ICD-10-CM

## 2021-11-11 DIAGNOSIS — H9209 Otalgia, unspecified ear: Secondary | ICD-10-CM

## 2021-11-11 NOTE — Progress Notes (Signed)
E Visit for Sinus Pressure and Ear pain  We are sorry that you are not feeling well. Here is how we plan to help!  Ear pain starts when sinuses are getting blocked up. Flonase takes 3-5 days to start helping, sometimes 7. Continue the use of that. It is not at the time for an antibiotic to be started. Flonase should help your sinuses drain with continued use to help relive that pressure.   If you have a fever 102 and up and significantly worsening symptoms, this could indicate a more serious infection moving to the middle/inner and needs face to face evaluation in an office by a provider.  Your symptoms should improve over the next 3 days and should resolve in about 7 days.  HOME CARE:  Wash your hands frequently. Do not place the tip of the bottle on your ear or touch it with your fingers. You can take Acetominophen 650 mg every 4-6 hours as needed for pain.  If pain is severe or moderate, you can apply a heating pad (set on low) or hot water bottle (wrapped in a towel) to outer ear for 20 minutes.  This will also increase drainage. Avoid ear plugs Do not use Q-tips After showers, help the water run out by tilting your head to one side.  GET HELP RIGHT AWAY IF:  Fever is over 102.2 degrees. You develop progressive ear pain or hearing loss. Ear symptoms persist longer than 3 days after treatment.  MAKE SURE YOU:  Understand these instructions. Will watch your condition. Will get help right away if you are not doing well or get worse.   Thank you for choosing an e-visit.  Your e-visit answers were reviewed by a board certified advanced clinical practitioner to complete your personal care plan. Depending upon the condition, your plan could have included both over the counter or prescription medications.  Please review your pharmacy choice. Make sure the pharmacy is open so you can pick up prescription now. If there is a problem, you may contact your provider through [employer] and have the prescription routed to another pharmacy.  Your safety is important to [place]. If you have drug allergies check your prescription carefully.   For the next 24 hours you can use MyChart to ask questions about today's visit, request a non-urgent call back, or ask for a work or school excuse. You will get an email in the next two days asking about your experience. I hope that your e-visit has been valuable and will speed your recovery.   I provided 5 minutes of non face-to-face time during this encounter for chart review, medication and order placement, as well as and documentation.

## 2021-11-19 ENCOUNTER — Other Ambulatory Visit: Payer: Self-pay

## 2021-11-19 ENCOUNTER — Ambulatory Visit (HOSPITAL_COMMUNITY)
Admission: EM | Admit: 2021-11-19 | Discharge: 2021-11-19 | Disposition: A | Discharge status: Home / Self Care | Payer: Medicaid Other | Attending: Physician Assistant | Admitting: Physician Assistant

## 2021-11-19 ENCOUNTER — Encounter (HOSPITAL_COMMUNITY): Payer: Self-pay | Admitting: Emergency Medicine

## 2021-11-19 ENCOUNTER — Telehealth: Payer: Self-pay | Admitting: Physician Assistant

## 2021-11-19 DIAGNOSIS — H9201 Otalgia, right ear: Secondary | ICD-10-CM

## 2021-11-19 DIAGNOSIS — R0981 Nasal congestion: Secondary | ICD-10-CM

## 2021-11-19 DIAGNOSIS — J069 Acute upper respiratory infection, unspecified: Secondary | ICD-10-CM | POA: Diagnosis not present

## 2021-11-19 MED ORDER — NEOMYCIN-POLYMYXIN-HC 3.5-10000-1 OT SUSP
4.0000 [drp] | Freq: Three times a day (TID) | OTIC | 0 refills | Status: DC
Start: 2021-11-19 — End: 2022-05-21

## 2021-11-19 NOTE — Discharge Instructions (Signed)
Advised to continue using ibuprofen for pain relief and Flonase nasal spray to help open up the sinuses and the drainage passageways. Advised to use Cortisporin eardrops several times a day to help decrease the ear canal pain. Advised follow-up PCP or return to urgent care if symptoms fail to improve.

## 2021-11-19 NOTE — ED Provider Notes (Signed)
[facility]    CSN: [id number] Arrival date & time: 11/19/21  1000      History   Chief Complaint Chief Complaint  Patient presents with   Otalgia    HPI Tammy Frost is a 41 y.o. female.   41 year old female presents with right ear pain.  Patient indicates that last week she had sinus congestion with infection, she relates that she has been taking ibuprofen for the pain, and using Flonase nasal spray for the congestion.  She indicates that for the past several days she has been having right ear pain and pressure, she indicates that the ear hurts when she pushes on the tragus.  Denies any drainage from the ear.  She relates she has not have any fever or chills, but still has some nasal congestion with clear to yellow drainage.  She is tolerating fluids well   Otalgia   Past Medical History:  Diagnosis Date   Abdominal gas pain 12/25/2019   Anxiety    Chicken pox    Chronic cholecystitis with calculus    Depression    Gallstones    UTI (urinary tract infection) 07-20-15    Patient Active Problem List   Diagnosis Date Noted   Closed fracture of tooth 05/02/2020   URI with cough and congestion 01/25/2020   Upper respiratory tract infection 01/25/2020   Body mass index (BMI) 36.0-36.9, adult 01/25/2020   Low serum vitamin B12 01/25/2020   Vitamin D deficiency 01/25/2020   Anxiety and depression 11/08/2019   Encounter for preventative adult health care examination 11/08/2019   Situational anxiety 11/01/2019   Obesity, morbid, BMI 40.0-49.9 (HCC) 11/01/2019    Past Surgical History:  Procedure Laterality Date   APPENDECTOMY     CESAREAN SECTION  12/27/2011   CESAREAN SECTION  12/31/2013   CHOLECYSTECTOMY N/A 09/02/2015   Procedure: LAPAROSCOPIC CHOLECYSTECTOMY WITH INTRAOPERATIVE CHOLANGIOGRAM;  Surgeon: Gladis Riffle, MD;  Location: [facility];  Service: General;  Laterality: N/A;   CHOLECYSTECTOMY     LAPAROSCOPIC APPENDECTOMY N/A 09/04/2017    Procedure: APPENDECTOMY LAPAROSCOPIC;  Surgeon: Leafy Ro, MD;  Location: [facility];  Service: General;  Laterality: N/A;    OB History     Gravida  1   Para      Term      Preterm      AB      Living         SAB      IAB      Ectopic      Multiple      Live Births               Home Medications    Prior to Admission medications   Medication Sig Start Date End Date Taking? Authorizing Provider  neomycin-polymyxin-hydrocortisone (CORTISPORIN) 3.5-10000-1 OTIC suspension Place 4 drops into the right ear 3 (three) times daily. 11/19/21  Yes Ellsworth Lennox, PA-C  chlorhexidine (PERIDEX) 0.12 % solution Use as directed 15 mLs in the mouth or throat 2 (two) times daily. Patient not taking: Reported on 11/19/2021 12/20/20   Claiborne Rigg, NP  cyanocobalamin (,VITAMIN B-12,) 1000 MCG/ML injection 1000 mcg (1 mg) injection once per week for four weeks, followed by 1000 mcg injection once per month. 11/08/19   Theadore Nan, NP  ergocalciferol (VITAMIN D2) 1.25 MG (50000 UT) capsule Take 1 capsule (50,000 Units total) by mouth once a week. 09/21/21   McDonough, Lauren K, PA-C  fluticasone (FLONASE) 50  MCG/ACT nasal spray Place 2 sprays into both nostrils daily. 11/09/21   Viviano Simas, FNP  ibuprofen (ADVIL) 800 MG tablet Take 1 tablet (800 mg total) by mouth every 8 (eight) hours as needed (pain). 08/18/21   Zenia Resides, MD  ondansetron (ZOFRAN ODT) 4 MG disintegrating tablet Take 1 tablet (4 mg total) by mouth every 8 (eight) hours as needed for nausea or vomiting. Patient not taking: Reported on 11/19/2021 09/05/21   Willy Eddy, MD  sertraline (ZOLOFT) 25 MG tablet TAKE 1 TABLET (25 MG TOTAL) BY MOUTH DAILY. Patient not taking: Reported on 11/19/2021 05/22/20   Lyndon Code, MD  albuterol (PROVENTIL HFA;VENTOLIN HFA) [phone] Base) MCG/ACT inhaler Inhale 2 puffs into the lungs every 6 (six) hours as needed for wheezing or shortness of breath. Patient not taking:  Reported on 07/10/2018 10/03/17 09/19/18  Nita Sickle, MD  ipratropium (ATROVENT) 0.06 % nasal spray Place 2 sprays into both nostrils 4 (four) times daily. Patient not taking: Reported on 07/10/2018 07/09/18 09/19/18  Ofilia Neas, PA-C    Family History Family History  Problem Relation Age of Onset   Cancer Maternal Grandmother        lung   Emphysema Maternal Grandmother    Depression Maternal Grandmother    Heart disease Maternal Grandmother    Hyperlipidemia Maternal Grandmother    Hypertension Maternal Grandmother    Stroke Maternal Grandmother    Cancer Maternal Grandfather        pancreatic   Hypertension Mother    Depression Mother    Hyperlipidemia Mother    Asthma Son    Cancer Paternal Grandmother    Cancer Paternal Grandfather    Heart disease Paternal Grandfather    Alcohol abuse Neg Hx    Arthritis Neg Hx    Birth defects Neg Hx    COPD Neg Hx    Diabetes Neg Hx    Drug abuse Neg Hx    Early death Neg Hx    Hearing loss Neg Hx    Kidney disease Neg Hx    Learning disabilities Neg Hx    Mental illness Neg Hx    Mental retardation Neg Hx    Miscarriages / Stillbirths Neg Hx    Vision loss Neg Hx    Varicose Veins Neg Hx     Social History Social History   Tobacco Use   Smoking status: Never   Smokeless tobacco: Never  Vaping Use   Vaping Use: Never used  Substance Use Topics   Alcohol use: No   Drug use: No     Allergies   Sulfa antibiotics and Flagyl [metronidazole]   Review of Systems Review of Systems  HENT:  Positive for ear pain (right).      Physical Exam Triage Vital Signs ED Triage Vitals  Enc Vitals Group     BP 11/19/21 1106 123/81     Pulse Rate 11/19/21 1106 83     Resp 11/19/21 1106 18     Temp 11/19/21 1106 98.6 F (37 C)     Temp Source 11/19/21 1106 Oral     SpO2 11/19/21 1106 98 %     Weight --      Height --      Head Circumference --      Peak Flow --      Pain Score 11/19/21 1103 6     Pain Loc --       Pain Edu? --  Excl. in GC? --    No data found.  Updated Vital Signs BP 123/81 (BP Location: Left Arm) Comment (BP Location): large cuff  Pulse 83   Temp 98.6 F (37 C) (Oral)   Resp 18   LMP 11/02/2021   SpO2 98%   Visual Acuity Right Eye Distance:   Left Eye Distance:   Bilateral Distance:    Right Eye Near:   Left Eye Near:    Bilateral Near:     Physical Exam Constitutional:      Appearance: Normal appearance.  HENT:     Right Ear: Tympanic membrane is injected.     Left Ear: Tympanic membrane is injected.     Ears:     Comments: Ears: Right ear canal with mild tenderness on palpation of the tragus, mild redness and swelling the just early entryway into the ear canal with a possible small acne bump formation present.    Mouth/Throat:     Mouth: Mucous membranes are moist.     Pharynx: Oropharynx is clear.  Cardiovascular:     Rate and Rhythm: Normal rate and regular rhythm.     Heart sounds: Normal heart sounds.  Pulmonary:     Effort: Pulmonary effort is normal.     Breath sounds: Normal breath sounds and air entry. No wheezing, rhonchi or rales.  Lymphadenopathy:     Cervical: No cervical adenopathy.  Neurological:     Mental Status: She is alert.      UC Treatments / Results  Labs (all labs ordered are listed, but only abnormal results are displayed) Labs Reviewed - No data to display  EKG   Radiology No results found.  Procedures Procedures (including critical care time)  Medications Ordered in UC Medications - No data to display  Initial Impression / Assessment and Plan / UC Course  I have reviewed the triage vital signs and the nursing notes.  Pertinent labs & imaging results that were available during my care of the patient were reviewed by me and considered in my medical decision making (see chart for details).    Plan: 1.  Advised to take ibuprofen for pain relief. 2.  Advised to continue Flonase nasal spray to help  control the sinus congestion and encourage drainage. 3.  Advised use Cortisporin drops, 3 to 4 drops in the right ear 2-3 times a day to help decrease pain and swelling. 4.  Advised to follow-up PCP or return to urgent care if symptoms fail to improve. Final Clinical Impressions(s) / UC Diagnoses   Final diagnoses:  Right ear pain  Viral upper respiratory tract infection  Sinus congestion     Discharge Instructions      Advised to continue using ibuprofen for pain relief and Flonase nasal spray to help open up the sinuses and the drainage passageways. Advised to use Cortisporin eardrops several times a day to help decrease the ear canal pain. Advised follow-up PCP or return to urgent care if symptoms fail to improve.    ED Prescriptions     Medication Sig Dispense Auth. Provider   neomycin-polymyxin-hydrocortisone (CORTISPORIN) 3.5-10000-1 OTIC suspension Place 4 drops into the right ear 3 (three) times daily. 10 mL Ellsworth Lennox, PA-C      PDMP not reviewed this encounter.   Ellsworth Lennox, PA-C 11/19/21 1126

## 2021-11-19 NOTE — Telephone Encounter (Signed)
..   Medicaid Managed Care   Unsuccessful Outreach Note  11/19/2021 Name: Tammy Frost MRN: [id number] DOB: 07/16/1980  Referred by: Carlean Jews, PA-C Reason for referral : High Risk Managed Medicaid (I called the patient today to get her scheduled with the MM Team. I left my name and number on her VM.)   An unsuccessful telephone outreach was attempted today. The patient was referred to the case management team for assistance with care management and care coordination.   Follow Up Plan: The care management team will reach out to the patient again over the next 14 days.    Weston Settle Care Guide, High Risk Medicaid Managed Care Embedded Care Coordination [facility]  [facility]

## 2021-11-19 NOTE — ED Triage Notes (Signed)
Right ear pain x 1 1/2 weeks .  Also complains of runny nose.  Patient has used tylenol, ibuprofen and fluticasone.

## 2021-11-21 ENCOUNTER — Telehealth: Payer: Medicaid Other | Admitting: Nurse Practitioner

## 2021-11-21 DIAGNOSIS — K0889 Other specified disorders of teeth and supporting structures: Secondary | ICD-10-CM

## 2021-11-21 MED ORDER — AMOXICILLIN 500 MG PO CAPS: 500.0000 mg | ORAL_CAPSULE | Freq: Three times a day (TID) | ORAL | 0 refills | Status: AC

## 2021-11-21 NOTE — Progress Notes (Signed)

## 2021-11-26 ENCOUNTER — Ambulatory Visit (INDEPENDENT_AMBULATORY_CARE_PROVIDER_SITE_OTHER): Payer: Medicaid Other | Admitting: Physician Assistant

## 2021-11-26 ENCOUNTER — Encounter: Payer: Self-pay | Admitting: Physician Assistant

## 2021-11-26 DIAGNOSIS — Z01419 Encounter for gynecological examination (general) (routine) without abnormal findings: Secondary | ICD-10-CM | POA: Diagnosis not present

## 2021-11-26 DIAGNOSIS — F411 Generalized anxiety disorder: Secondary | ICD-10-CM | POA: Diagnosis not present

## 2021-11-26 DIAGNOSIS — E538 Deficiency of other specified B group vitamins: Secondary | ICD-10-CM | POA: Diagnosis not present

## 2021-11-26 DIAGNOSIS — Z1231 Encounter for screening mammogram for malignant neoplasm of breast: Secondary | ICD-10-CM

## 2021-11-26 DIAGNOSIS — R300 Dysuria: Secondary | ICD-10-CM

## 2021-11-26 DIAGNOSIS — Z0001 Encounter for general adult medical examination with abnormal findings: Secondary | ICD-10-CM | POA: Diagnosis not present

## 2021-11-26 DIAGNOSIS — E559 Vitamin D deficiency, unspecified: Secondary | ICD-10-CM

## 2021-11-26 MED ORDER — HYDROXYZINE HCL 10 MG PO TABS: 10.0000 mg | ORAL_TABLET | Freq: Three times a day (TID) | ORAL | 0 refills | Status: DC | PRN

## 2021-11-26 NOTE — Progress Notes (Unsigned)
[facility] [place], [place] [address]  Internal MEDICINE  Office Visit Note  Patient Name: Tammy Frost  [id number]  [id number]  Date of Service: 12/02/2021  Chief Complaint  Patient presents with   Annual Exam   Depression   Anxiety     HPI Pt is here for routine health maintenance examination -She is on amoxicillin for sinus and ear infection, she did a covid test after exposures at work and tested negative. Feeling better on ABX -Doing B12 injections and vit D weekly and is feeling better -zoloft started to make her sleepy and stopped, has been managing anxiety for the most part by stepping away from situations as needed and deep breathing. Discussed trying hydroxyzine on as needed basis, but due to hx of sleepiness on anxiety meds will start with 77m dose instead of 277mand may need to titrate up in future. She is aware of potential grogginess from this still. 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 Will have pap with their office. -Due for mammogram  Current Medication: Outpatient Encounter Medications as of 11/26/2021  Medication Sig   [EXPIRED] amoxicillin (AMOXIL) 500 MG capsule Take 1 capsule (500 mg total) by mouth 3 (three) times daily for 10 days.   chlorhexidine (PERIDEX) 0.12 % solution Use as directed 15 mLs in the mouth or throat 2 (two) times daily.   cyanocobalamin (,VITAMIN B-12,) 1000 MCG/ML injection 1000 mcg (1 mg) injection once per week for four weeks, followed by 1000 mcg injection once per month.   ergocalciferol (VITAMIN D2) 1.25 MG (50000 UT) capsule Take 1 capsule (50,000 Units total) by mouth once a week.   fluticasone (FLONASE) 50 MCG/ACT nasal spray Place 2 sprays into both nostrils daily.   hydrOXYzine (ATARAX) 10 MG tablet  Take 1 tablet (10 mg total) by mouth 3 (three) times daily as needed.   ibuprofen (ADVIL) 800 MG tablet Take 1 tablet (800 mg total) by mouth every 8 (eight) hours as needed (pain).   neomycin-polymyxin-hydrocortisone (CORTISPORIN) 3.5-10000-1 OTIC suspension Place 4 drops into the right ear 3 (three) times daily.   ondansetron (ZOFRAN ODT) 4 MG disintegrating tablet Take 1 tablet (4 mg total) by mouth every 8 (eight) hours as needed for nausea or vomiting.   [DISCONTINUED] sertraline (ZOLOFT) 25 MG tablet TAKE 1 TABLET (25 MG TOTAL) BY MOUTH DAILY.   [DISCONTINUED] albuterol (PROVENTIL HFA;VENTOLIN HFA) 108 (90 Base) MCG/ACT inhaler Inhale 2 puffs into the lungs every 6 (six) hours as needed for wheezing or shortness of breath. (Patient not taking: Reported on 07/10/2018)   [DISCONTINUED] ipratropium (ATROVENT) 0.06 % nasal spray Place 2 sprays into both nostrils 4 (four) times daily. (Patient not taking: Reported on 07/10/2018)   No facility-administered encounter medications on file as of 11/26/2021.    Surgical History: Past Surgical History:  Procedure Laterality Date   APPENDECTOMY     CESAREAN SECTION  12/27/2011   CESAREAN SECTION  12/31/2013   CHOLECYSTECTOMY N/A 09/02/2015   Procedure: LAPAROSCOPIC CHOLECYSTECTOMY WITH INTRAOPERATIVE CHOLANGIOGRAM;  Surgeon: CaHubbard RobinsonMD;  Location: [facility] ORS;  Service: General;  Laterality: N/A;   CHOLECYSTECTOMY     LAPAROSCOPIC APPENDECTOMY N/A 09/04/2017   Procedure: APPENDECTOMY LAPAROSCOPIC;  Surgeon: PaJules HusbandsMD;  Location: [facility] ORS;  Service: General;  Laterality: N/A;    Medical History: Past Medical History:  Diagnosis Date   Abdominal gas pain 12/25/2019   Anxiety    Chicken pox    Chronic cholecystitis with calculus    Depression    Gallstones    UTI (urinary tract infection) 07-20-15    Family History: Family History  Problem Relation Age of Onset   Cancer Maternal Grandmother        lung   Emphysema Maternal  Grandmother    Depression Maternal Grandmother    Heart disease Maternal Grandmother    Hyperlipidemia Maternal Grandmother    Hypertension Maternal Grandmother    Stroke Maternal Grandmother    Cancer Maternal Grandfather        pancreatic   Hypertension Mother    Depression Mother    Hyperlipidemia Mother    Asthma Son    Cancer Paternal Grandmother    Cancer Paternal Grandfather    Heart disease Paternal Grandfather    Alcohol abuse Neg Hx    Arthritis Neg Hx    Birth defects Neg Hx    COPD Neg Hx    Diabetes Neg Hx    Drug abuse Neg Hx    Early death Neg Hx    Hearing loss Neg Hx    Kidney disease Neg Hx    Learning disabilities Neg Hx    Mental illness Neg Hx    Mental retardation Neg Hx    Miscarriages / Stillbirths Neg Hx    Vision loss Neg Hx    Varicose Veins Neg Hx       Review of Systems  Constitutional:  Negative for chills, fatigue and unexpected weight change.  HENT:  Negative for congestion, postnasal drip, rhinorrhea, sneezing and sore throat.   Eyes:  Negative for redness.  Respiratory:  Negative for cough, chest tightness and shortness of breath.   Cardiovascular:  Negative for chest pain and palpitations.  Gastrointestinal:  Negative for abdominal pain, constipation, diarrhea, nausea and vomiting.  Genitourinary:  Negative for dysuria and frequency.  Musculoskeletal:  Negative for arthralgias, back pain, joint swelling and neck pain.  Skin:  Negative for rash.  Neurological: Negative.  Negative for tremors and numbness.  Hematological:  Negative for adenopathy. Does not bruise/bleed easily.  Psychiatric/Behavioral:  Negative for behavioral problems (Depression), sleep disturbance and suicidal ideas. The patient is nervous/anxious.      Vital Signs: BP 116/84   Pulse 69   Temp 97.7 F (36.5 C)   Resp 16   Ht '5\' 2"'  (1.575 m)   Wt 208 lb 12.8 oz (94.7 kg)   LMP 11/02/2021   SpO2 100%   BMI 38.19 kg/m    Physical Exam Vitals and nursing  note reviewed.  Constitutional:      General: She is not in acute distress.    Appearance: Normal appearance. She is well-developed. She is obese. She is not diaphoretic.  HENT:     Head: Normocephalic and atraumatic.     Right Ear: Tympanic membrane normal.     Left Ear: Tympanic membrane normal.     Mouth/Throat:     Pharynx: No oropharyngeal exudate.  Eyes:     Pupils: Pupils are equal, round, and reactive to light.  Neck:     Thyroid: No thyromegaly.     Vascular: No JVD.     Trachea: No tracheal deviation.  Cardiovascular:     Rate and Rhythm: Normal rate and regular rhythm.  Heart sounds: Normal heart sounds. No murmur heard.    No friction rub. No gallop.  Pulmonary:     Effort: Pulmonary effort is normal. No respiratory distress.     Breath sounds: No wheezing or rales.  Chest:     Chest wall: No tenderness.  Breasts:    Right: Normal. No mass.     Left: Normal. No mass.  Abdominal:     General: Bowel sounds are normal.     Palpations: Abdomen is soft.     Tenderness: There is no abdominal tenderness.  Musculoskeletal:        General: Normal range of motion.     Cervical back: Normal range of motion and neck supple.  Lymphadenopathy:     Cervical: No cervical adenopathy.  Skin:    General: Skin is warm and dry.  Neurological:     Mental Status: She is alert and oriented to person, place, and time.     Cranial Nerves: No cranial nerve deficit.  Psychiatric:        Behavior: Behavior normal.        Thought Content: Thought content normal.        Judgment: Judgment normal.      LABS: Recent Results (from the past 2160 hour(s))  Lipase, blood     Status: None   Collection Time: 09/05/21  2:52 PM  Result Value Ref Range   Lipase 24 11 - 51 U/L    Comment: Performed at [facility], [address] [place], [place] [address]  Comprehensive metabolic panel     Status: Abnormal   Collection Time: 09/05/21  2:52 PM  Result Value Ref Range    Sodium 135 135 - 145 mmol/L   Potassium 3.2 (L) 3.5 - 5.1 mmol/L   Chloride 107 98 - 111 mmol/L   CO2 20 (L) 22 - 32 mmol/L   Glucose, Bld 110 (H) 70 - 99 mg/dL    Comment: Glucose reference range applies only to samples taken after fasting for at least 8 hours.   BUN 12 6 - 20 mg/dL   Creatinine, Ser 0.92 0.44 - 1.00 mg/dL   Calcium 9.4 8.9 - 10.3 mg/dL   Total Protein 8.3 (H) 6.5 - 8.1 g/dL   Albumin 4.1 3.5 - 5.0 g/dL   AST 23 15 - 41 U/L   ALT 21 0 - 44 U/L   Alkaline Phosphatase 49 38 - 126 U/L   Total Bilirubin 0.5 0.3 - 1.2 mg/dL   GFR, Estimated >60 >60 mL/min    Comment: (NOTE) Calculated using the CKD-EPI Creatinine Equation (2021)    Anion gap 8 5 - 15    Comment: Performed at [facility], [address] [place], [place] [address]  CBC     Status: None   Collection Time: 09/05/21  2:52 PM  Result Value Ref Range   WBC 4.0 4.0 - 10.5 K/uL   RBC 5.05 3.87 - 5.11 MIL/uL   Hemoglobin 13.5 12.0 - 15.0 g/dL   HCT 42.1 36.0 - 46.0 %   MCV 83.4 80.0 - 100.0 fL   MCH 26.7 26.0 - 34.0 pg   MCHC 32.1 30.0 - 36.0 g/dL   RDW 12.6 11.5 - 15.5 %   Platelets 203 150 - 400 K/uL   nRBC 0.0 0.0 - 0.2 %    Comment: Performed at [facility], [address] [place],  [address]  Urinalysis, Routine w reflex microscopic Urine, Clean Catch  Status: Abnormal   Collection Time: 09/05/21  3:27 PM  Result Value Ref Range   Color, Urine YELLOW (A) YELLOW   APPearance CLOUDY (A) CLEAR   Specific Gravity, Urine 1.028 1.005 - 1.030   pH 5.0 5.0 - 8.0   Glucose, UA NEGATIVE NEGATIVE mg/dL   Hgb urine dipstick MODERATE (A) NEGATIVE   Bilirubin Urine NEGATIVE NEGATIVE   Ketones, ur 5 (A) NEGATIVE mg/dL   Protein, ur 30 (A) NEGATIVE mg/dL   Nitrite NEGATIVE NEGATIVE   Leukocytes,Ua NEGATIVE NEGATIVE   RBC / HPF 0-5 0 - 5 RBC/hpf   WBC, UA 6-10 0 - 5 WBC/hpf   Bacteria, UA RARE (A) NONE SEEN   Squamous Epithelial / LPF 11-20 0 - 5   Mucus PRESENT      Comment: Performed at [facility], [address] [place], [place] [address]  POC urine preg, ED     Status: None   Collection Time: 09/05/21  3:29 PM  Result Value Ref Range   Preg Test, Ur NEGATIVE NEGATIVE    Comment:        THE SENSITIVITY OF THIS METHODOLOGY IS >24 mIU/mL   Comprehensive metabolic panel     Status: None   Collection Time: 09/11/21 11:00 AM  Result Value Ref Range   Glucose 97 70 - 99 mg/dL   BUN 9 6 - 24 mg/dL   Creatinine, Ser 0.95 0.57 - 1.00 mg/dL   eGFR 78 >59 mL/min/1.73   BUN/Creatinine Ratio 9 9 - 23   Sodium 140 134 - 144 mmol/L   Potassium 4.2 3.5 - 5.2 mmol/L   Chloride 105 96 - 106 mmol/L   CO2 20 20 - 29 mmol/L   Calcium 8.9 8.7 - 10.2 mg/dL   Total Protein 6.7 6.0 - 8.5 g/dL   Albumin 4.0 3.8 - 4.8 g/dL    Comment:                **Effective September 21, 2021 Albumin reference interval**                  will be changing to:                             Age                  Female          Female                            0 -   7 days       3.6 - 4.9      3.6 - 4.9                            8 -  30 days       3.5 - 4.6      3.5 - 4.6                            1 -   6 months     3.7 - 4.8      3.7 - 4.8                     7 months -   2 years  4.0 - 5.0      4.0 - 5.0                            3 -   5 years      4.1 - 5.0      4.1 - 5.0                            6 -  12 years      4.2 - 5.0      4.2 - 5.0                           13 -  30 years      4.3 - 5.2      4.0 - 5.0                           31 -  50 years      4.1 - 5.1      3.9 - 4.9                           51 -  60 years      3.8 - 4.9      3.8 - 4.9                           61 -  70 years      3.9 - 4.9      3.9 - 4.9                           71 -  80 years      3.8 - 4.8      3.8 - 4.'8                           8 1 ' -  89 years      3.7 - 4.7      3.7 - 4.7                           90 - 199 years      3.6 - 4.6      3.6 - 4.6    Globulin, Total 2.7 1.5 - 4.5  g/dL   Albumin/Globulin Ratio 1.5 1.2 - 2.2   Bilirubin Total 0.3 0.0 - 1.2 mg/dL   Alkaline Phosphatase 46 44 - 121 IU/L   AST 11 0 - 40 IU/L   ALT 14 0 - 32 IU/L  B12 and Folate Panel     Status: None   Collection Time: 09/11/21 11:00 AM  Result Value Ref Range   Vitamin B-12 257 232 - 1,245 pg/mL   Folate 4.0 >3.0 ng/mL    Comment: A serum folate concentration of less than 3.1 ng/mL is considered to represent clinical deficiency.   VITAMIN D 25 Hydroxy (Vit-D Deficiency, Fractures)     Status: Abnormal   Collection Time: 09/11/21 11:00 AM  Result Value Ref Range   Vit D, 25-Hydroxy 16.6 (L) 30.0 - 100.0 ng/mL    Comment: Vitamin D deficiency has been defined by the Institute of Medicine  and an Endocrine Society practice guideline as a level of serum 25-OH vitamin D less than 20 ng/mL (1,2). The Endocrine Society went on to further define vitamin D insufficiency as a level between 21 and 29 ng/mL (2). 1. IOM (Institute of Medicine). 2010. Dietary reference    intakes for calcium and D. [place]: The    [employer]. 2. Holick MF, Binkley [place], Bischoff-Ferrari HA, et al.    Evaluation, treatment, and prevention of vitamin D    deficiency: an Endocrine Society clinical practice    guideline. JCEM. 2011 Jul; 96(7):1911-30.   Lipid Panel With LDL/HDL Ratio     Status: Abnormal   Collection Time: 09/11/21 11:00 AM  Result Value Ref Range   Cholesterol, Total 139 100 - 199 mg/dL   Triglycerides 121 0 - 149 mg/dL   HDL 31 (L) >39 mg/dL   VLDL Cholesterol Cal 22 5 - 40 mg/dL   LDL Chol Calc (NIH) 86 0 - 99 mg/dL   LDL/HDL Ratio 2.8 0.0 - 3.2 ratio    Comment:                                     LDL/HDL Ratio                                             Men  Women                               1/2 Avg.Risk  1.0    1.5                                   Avg.Risk  3.6    3.2                                2X Avg.Risk  6.2    5.0                                3X  Avg.Risk  8.0    6.1   TSH + free T4     Status: None   Collection Time: 09/11/21 11:00 AM  Result Value Ref Range   TSH 1.340 0.450 - 4.500 uIU/mL   Free T4 1.20 0.82 - 1.77 ng/dL  UA/M w/rflx Culture, Routine     Status: None   Collection Time: 11/26/21  2:04 PM   Specimen: Urine   Urine  Result Value Ref Range   Specific Gravity, UA 1.015 1.005 - 1.030   pH, UA 6.0 5.0 - 7.5   Color, UA Yellow Yellow   Appearance Ur Clear Clear   Leukocytes,UA Negative Negative   Protein,UA Negative Negative/Trace   Glucose, UA Negative Negative   Ketones, UA Negative Negative   RBC, UA Negative Negative   Bilirubin, UA Negative Negative   Urobilinogen, Ur 0.2 0.2 - 1.0 mg/dL   Nitrite, UA Negative Negative   Microscopic Examination Comment     Comment: Microscopic follows if indicated.   Microscopic Examination See below:  Comment: Microscopic was indicated and was performed.   Urinalysis Reflex Comment     Comment: This specimen will not reflex to a Urine Culture.  Microscopic Examination     Status: None   Collection Time: 11/26/21  2:04 PM   Urine  Result Value Ref Range   WBC, UA None seen 0 - 5 /hpf   RBC, Urine None seen 0 - 2 /hpf   Epithelial Cells (non renal) 0-10 0 - 10 /hpf   Casts None seen None seen /lpf   Bacteria, UA None seen None seen/Few        Assessment/Plan: 1. Encounter for general adult medical examination with abnormal findings Cpe performed, labs previously reviewed, due for mammogram, will have pap with OBGYN  2. GAD (generalized anxiety disorder) May use hydroxyzine as needed for anxiety, may call if higher dose or alternative needed  - hydrOXYzine (ATARAX) 10 MG tablet; Take 1 tablet (10 mg total) by mouth 3 (three) times daily as needed.  Dispense: 30 tablet; Refill: 0  3. Vitamin D deficiency Continue vit D supplement  4. B12 deficiency Continue b12 injections  5. Visit for screening mammogram - MM 3D SCREEN BREAST BILATERAL;  Future  6. Visit for gynecologic examination Breast exam performed  7. Dysuria - UA/M w/rflx Culture, Routine - Microscopic Examination   General Counseling: Lizette verbalizes understanding of the findings of todays visit and agrees with plan of treatment. I have discussed any further diagnostic evaluation that may be needed or ordered today. We also reviewed her medications today. she has been encouraged to call the office with any questions or concerns that should arise related to todays visit.    Counseling:    Orders Placed This Encounter  Procedures   Microscopic Examination   MM 3D SCREEN BREAST BILATERAL   UA/M w/rflx Culture, Routine    Meds ordered this encounter  Medications   hydrOXYzine (ATARAX) 10 MG tablet    Sig: Take 1 tablet (10 mg total) by mouth 3 (three) times daily as needed.    Dispense:  30 tablet    Refill:  0    This patient was seen by Drema Dallas, PA-C in collaboration with Dr. Clayborn Bigness as a part of collaborative care agreement.  Total time spent:35 Minutes  Time spent includes review of chart, medications, test results, and follow up plan with the patient.     Lavera Guise, MD  Internal Medicine

## 2021-11-27 LAB — UA/M W/RFLX CULTURE, ROUTINE
Bilirubin, UA: NEGATIVE
Glucose, UA: NEGATIVE
Ketones, UA: NEGATIVE
Leukocytes,UA: NEGATIVE
Nitrite, UA: NEGATIVE
Protein,UA: NEGATIVE
RBC, UA: NEGATIVE
Specific Gravity, UA: 1.015 (ref 1.005–1.030)
Urobilinogen, Ur: 0.2 mg/dL (ref 0.2–1.0)
pH, UA: 6 (ref 5.0–7.5)

## 2021-11-27 LAB — MICROSCOPIC EXAMINATION
Bacteria, UA: NONE SEEN
Casts: NONE SEEN /lpf
RBC, Urine: NONE SEEN /hpf (ref 0–2)
WBC, UA: NONE SEEN /hpf (ref 0–5)

## 2021-12-08 ENCOUNTER — Ambulatory Visit (INDEPENDENT_AMBULATORY_CARE_PROVIDER_SITE_OTHER): Payer: Medicaid Other

## 2021-12-08 DIAGNOSIS — E538 Deficiency of other specified B group vitamins: Secondary | ICD-10-CM

## 2021-12-08 MED ORDER — CYANOCOBALAMIN 1000 MCG/ML IJ SOLN
1000.0000 ug | Freq: Once | INTRAMUSCULAR | Status: AC
  Administered 2021-12-08: 1000 ug via INTRAMUSCULAR

## 2021-12-28 ENCOUNTER — Telehealth: Payer: Self-pay | Admitting: Physician Assistant

## 2021-12-28 NOTE — Telephone Encounter (Signed)
..   Medicaid Managed Care   Unsuccessful Outreach Note  12/28/2021 Name: Tammy Frost MRN: [id number] DOB: 06-Mar-1981  Referred by: Mylinda Latina, PA-C Reason for referral : High Risk Managed Medicaid (I called the patient today to get her scheduled with the MM team. I left my name and number on her VM.)   A second unsuccessful telephone outreach was attempted today. The patient was referred to the case management team for assistance with care management and care coordination.   Follow Up Plan: The care management team will reach out to the patient again over the next 14 days.    [place]

## 2022-01-05 ENCOUNTER — Ambulatory Visit: Payer: Medicaid Other

## 2022-02-09 ENCOUNTER — Ambulatory Visit: Payer: Medicaid Other

## 2022-02-21 ENCOUNTER — Telehealth: Payer: Medicaid Other | Admitting: Physician Assistant

## 2022-02-21 DIAGNOSIS — S025XXA Fracture of tooth (traumatic), initial encounter for closed fracture: Secondary | ICD-10-CM

## 2022-02-21 MED ORDER — AMOXICILLIN 500 MG PO CAPS
500.0000 mg | ORAL_CAPSULE | Freq: Three times a day (TID) | ORAL | 0 refills | Status: AC
Start: 2022-02-21 — End: 2022-03-03

## 2022-02-21 MED ORDER — NAPROXEN 500 MG PO TABS: 500.0000 mg | ORAL_TABLET | Freq: Two times a day (BID) | ORAL | 0 refills | Status: DC

## 2022-02-21 NOTE — Progress Notes (Signed)

## 2022-02-21 NOTE — Addendum Note (Signed)
Addended by: Haynes Bast on: 02/21/2022 12:32 PM   Modules accepted: Orders

## 2022-03-07 ENCOUNTER — Ambulatory Visit (HOSPITAL_COMMUNITY)
Admission: EM | Admit: 2022-03-07 | Discharge: 2022-03-07 | Disposition: A | Discharge status: Home / Self Care | Payer: Medicaid Other | Attending: Internal Medicine | Admitting: Internal Medicine

## 2022-03-07 ENCOUNTER — Encounter (HOSPITAL_COMMUNITY): Payer: Self-pay | Admitting: Emergency Medicine

## 2022-03-07 DIAGNOSIS — J069 Acute upper respiratory infection, unspecified: Secondary | ICD-10-CM | POA: Diagnosis not present

## 2022-03-07 MED ORDER — PROMETHAZINE-DM 6.25-15 MG/5ML PO SYRP: 5.0000 mL | ORAL_SOLUTION | Freq: Every evening | ORAL | 0 refills | Status: DC | PRN

## 2022-03-07 MED ORDER — BENZONATATE 100 MG PO CAPS: 100.0000 mg | ORAL_CAPSULE | Freq: Three times a day (TID) | ORAL | 0 refills | Status: DC

## 2022-03-07 NOTE — ED Provider Notes (Signed)
[facility]    CSN: [id number] Arrival date & time: 03/07/22  1322      History   Chief Complaint Chief Complaint  Patient presents with   Cough   Fever    HPI Tammy Frost is a 41 y.o. female.   Patient presents urgent care for evaluation of rhinorrhea, nasal congestion, generalized headache, cough, sore throat, and fever/chills that started 3 days ago.  Son is sick with similar symptoms.  Denies history of asthma/chronic respiratory problems.  She is not a smoker and denies drug use.  She is not vaccinated against COVID-19 but has received her influenza vaccine this year.  She has been taking ibuprofen and Robitussin without very much relief of symptoms.  Denies nausea, vomiting, diarrhea, abdominal pain, flank pain, body aches, dizziness, and blurry vision.   Cough Associated symptoms: fever   Fever Associated symptoms: cough     Past Medical History:  Diagnosis Date   Abdominal gas pain 12/25/2019   Anxiety    Chicken pox    Chronic cholecystitis with calculus    Depression    Gallstones    UTI (urinary tract infection) 07-20-15    Patient Active Problem List   Diagnosis Date Noted   Closed fracture of tooth 05/02/2020   URI with cough and congestion 01/25/2020   Upper respiratory tract infection 01/25/2020   Body mass index (BMI) 36.0-36.9, adult 01/25/2020   Low serum vitamin B12 01/25/2020   Vitamin D deficiency 01/25/2020   Anxiety and depression 11/08/2019   Encounter for preventative adult health care examination 11/08/2019   Situational anxiety 11/01/2019   Obesity, morbid, BMI 40.0-49.9 (HCC) 11/01/2019    Past Surgical History:  Procedure Laterality Date   APPENDECTOMY     CESAREAN SECTION  12/27/2011   CESAREAN SECTION  12/31/2013   CHOLECYSTECTOMY N/A 09/02/2015   Procedure: LAPAROSCOPIC CHOLECYSTECTOMY WITH INTRAOPERATIVE CHOLANGIOGRAM;  Surgeon: Gladis Riffle, MD;  Location: [facility];  Service: General;  Laterality: N/A;    CHOLECYSTECTOMY     LAPAROSCOPIC APPENDECTOMY N/A 09/04/2017   Procedure: APPENDECTOMY LAPAROSCOPIC;  Surgeon: Leafy Ro, MD;  Location: [facility];  Service: General;  Laterality: N/A;    OB History     Gravida  1   Para      Term      Preterm      AB      Living         SAB      IAB      Ectopic      Multiple      Live Births               Home Medications    Prior to Admission medications   Medication Sig Start Date End Date Taking? Authorizing Provider  benzonatate (TESSALON) 100 MG capsule Take 1 capsule (100 mg total) by mouth every 8 (eight) hours. 03/07/22  Yes Carlisle Beers, FNP  promethazine-dextromethorphan (PROMETHAZINE-DM) 6.25-15 MG/5ML syrup Take 5 mLs by mouth at bedtime as needed for cough. 03/07/22  Yes Carlisle Beers, FNP  chlorhexidine (PERIDEX) 0.12 % solution Use as directed 15 mLs in the mouth or throat 2 (two) times daily. 12/20/20   Claiborne Rigg, NP  cyanocobalamin (,VITAMIN B-12,) 1000 MCG/ML injection 1000 mcg (1 mg) injection once per week for four weeks, followed by 1000 mcg injection once per month. 11/08/19   Theadore Nan, NP  ergocalciferol (VITAMIN D2) 1.25 MG (50000 UT) capsule Take  1 capsule (50,000 Units total) by mouth once a week. 09/21/21   McDonough, Lauren K, PA-C  fluticasone (FLONASE) 50 MCG/ACT nasal spray Place 2 sprays into both nostrils daily. 11/09/21   Viviano Simas, FNP  hydrOXYzine (ATARAX) 10 MG tablet Take 1 tablet (10 mg total) by mouth 3 (three) times daily as needed. 11/26/21   McDonough, Salomon Fick, PA-C  ibuprofen (ADVIL) 800 MG tablet Take 1 tablet (800 mg total) by mouth every 8 (eight) hours as needed (pain). 08/18/21   Zenia Resides, MD  ipratropium (ATROVENT) 0.03 % nasal spray Place 2 sprays into both nostrils every 12 (twelve) hours. 03/10/22   Margaretann Loveless, PA-C  naproxen (NAPROSYN) 500 MG tablet Take 1 tablet (500 mg total) by mouth 2 (two) times daily with a meal.  03/10/22   Burnette, Alessandra Bevels, PA-C  neomycin-polymyxin-hydrocortisone (CORTISPORIN) 3.5-10000-1 OTIC suspension Place 4 drops into the right ear 3 (three) times daily. 11/19/21   Ellsworth Lennox, PA-C  ondansetron (ZOFRAN ODT) 4 MG disintegrating tablet Take 1 tablet (4 mg total) by mouth every 8 (eight) hours as needed for nausea or vomiting. 09/05/21   Willy Eddy, MD  albuterol (PROVENTIL HFA;VENTOLIN HFA) 108 (90 Base) MCG/ACT inhaler Inhale 2 puffs into the lungs every 6 (six) hours as needed for wheezing or shortness of breath. Patient not taking: Reported on 07/10/2018 10/03/17 09/19/18  Nita Sickle, MD    Family History Family History  Problem Relation Age of Onset   Cancer Maternal Grandmother        lung   Emphysema Maternal Grandmother    Depression Maternal Grandmother    Heart disease Maternal Grandmother    Hyperlipidemia Maternal Grandmother    Hypertension Maternal Grandmother    Stroke Maternal Grandmother    Cancer Maternal Grandfather        pancreatic   Hypertension Mother    Depression Mother    Hyperlipidemia Mother    Asthma Son    Cancer Paternal Grandmother    Cancer Paternal Grandfather    Heart disease Paternal Grandfather    Alcohol abuse Neg Hx    Arthritis Neg Hx    Birth defects Neg Hx    COPD Neg Hx    Diabetes Neg Hx    Drug abuse Neg Hx    Early death Neg Hx    Hearing loss Neg Hx    Kidney disease Neg Hx    Learning disabilities Neg Hx    Mental illness Neg Hx    Mental retardation Neg Hx    Miscarriages / Stillbirths Neg Hx    Vision loss Neg Hx    Varicose Veins Neg Hx     Social History Social History   Tobacco Use   Smoking status: Never   Smokeless tobacco: Never  Vaping Use   Vaping Use: Never used  Substance Use Topics   Alcohol use: No   Drug use: No     Allergies   Sulfa antibiotics and Flagyl [metronidazole]   Review of Systems Review of Systems  Constitutional:  Positive for fever.  Respiratory:   Positive for cough.   Per HPI   Physical Exam Triage Vital Signs ED Triage Vitals  Enc Vitals Group     BP 03/07/22 1418 122/82     Pulse Rate 03/07/22 1418 76     Resp 03/07/22 1418 16     Temp 03/07/22 1418 97.9 F (36.6 C)     Temp Source 03/07/22 1418 Oral  SpO2 03/07/22 1418 98 %     Weight --      Height --      Head Circumference --      Peak Flow --      Pain Score 03/07/22 1421 0     Pain Loc --      Pain Edu? --      Excl. in GC? --    No data found.  Updated Vital Signs BP 122/82 (BP Location: Left Arm)   Pulse 76   Temp 97.9 F (36.6 C) (Oral)   Resp 16   SpO2 98%   Visual Acuity Right Eye Distance:   Left Eye Distance:   Bilateral Distance:    Right Eye Near:   Left Eye Near:    Bilateral Near:     Physical Exam Vitals and nursing note reviewed.  Constitutional:      Appearance: Normal appearance. She is not ill-appearing or toxic-appearing.  HENT:     Head: Normocephalic and atraumatic.     Right Ear: Hearing, tympanic membrane, ear canal and external ear normal.     Left Ear: Hearing, tympanic membrane, ear canal and external ear normal.     Nose: Congestion present.     Mouth/Throat:     Lips: Pink.     Mouth: Mucous membranes are moist.     Pharynx: No posterior oropharyngeal erythema.  Eyes:     General: Lids are normal. Vision grossly intact. Gaze aligned appropriately.        Right eye: No discharge.        Left eye: No discharge.     Extraocular Movements: Extraocular movements intact.     Conjunctiva/sclera: Conjunctivae normal.  Cardiovascular:     Rate and Rhythm: Normal rate and regular rhythm.     Heart sounds: Normal heart sounds, S1 normal and S2 normal.  Pulmonary:     Effort: Pulmonary effort is normal. No respiratory distress.     Breath sounds: Normal breath sounds and air entry.  Musculoskeletal:     Cervical back: Neck supple.     Right lower leg: No edema.     Left lower leg: No edema.  Lymphadenopathy:      Cervical: No cervical adenopathy.  Skin:    General: Skin is warm and dry.     Capillary Refill: Capillary refill takes less than 2 seconds.     Findings: No rash.  Neurological:     General: No focal deficit present.     Mental Status: She is alert and oriented to person, place, and time. Mental status is at baseline.     Cranial Nerves: No dysarthria or facial asymmetry.  Psychiatric:        Mood and Affect: Mood normal.        Speech: Speech normal.        Behavior: Behavior normal.        Thought Content: Thought content normal.        Judgment: Judgment normal.      UC Treatments / Results  Labs (all labs ordered are listed, but only abnormal results are displayed) Labs Reviewed - No data to display  EKG   Radiology No results found.  Procedures Procedures (including critical care time)  Medications Ordered in UC Medications - No data to display  Initial Impression / Assessment and Plan / UC Course  I have reviewed the triage vital signs and the nursing notes.  Pertinent labs & imaging results that  were available during my care of the patient were reviewed by me and considered in my medical decision making (see chart for details).   Viral URI with cough Symptoms and physical exam consistent with a viral upper respiratory tract infection that will likely resolve with rest, fluids, and prescriptions for symptomatic relief. No indication for imaging today based on stable cardiopulmonary exam and hemodynamically stable vital signs. Deferred viral testing due to low suspicion for COVID-19 or influenza based on patient's presentation and low risk for severe illness.  Tessalon Perles and Promethazine DM sent to pharmacy for symptomatic relief to be taken as prescribed.  May use guaifenesin over-the-counter every 12 hours for cough and nasal congestion. Promethazine DM cough medication may be used as needed only at bedtime due to possible drowsiness side effect (no alcohol,  working, or driving while taking this advised).   May use ibuprofen/tylenol over the counter for body aches, fever/chills, and overall discomfort associated with viral illness. Nonpharmacologic interventions for symptom relief provided and after visit summary below.   Strict ED/urgent care return precautions given.  Patient verbalizes understanding and agreement with plan.  Counseled patient regarding possible side effects and uses of all medications prescribed at today's visit.  Patient verbalizes understanding and agreement with plan.  All questions answered.  Patient discharged from urgent care in stable condition.        Final Clinical Impressions(s) / UC Diagnoses   Final diagnoses:  Viral URI with cough     Discharge Instructions      You have a viral upper respiratory infection.   Purchase mucinex (guaifenesin) 1200mg  and take this every 12 hours for the next few days to thin your nasal congestion and mucous so that you are able to get out of your body easier by coughing and blowing your nose. Drink plenty of water while taking this.  Take Promethazine DM cough medication to help with your cough at nighttime so that you are able to sleep. Do not drive, drink alcohol, or go to work while taking this medication since it can make you sleepy. Only take this at nighttime.   Take tessalon pearles every 8 hours as needed for cough.  You may take tylenol 1,000mg  and ibuprofen 600mg  every 6 hours with food as needed for fever/chills, sore throat, aches/pains, and inflammation associated with viral illness. Take this with food to avoid stomach upset.    You may do salt water and baking soda gargles every 4 hours as needed for your throat pain.  Please put 1 teaspoon of salt and 1/2 teaspoon of baking soda in 8 ounces of warm water then gargle and spit the water out. You may also put 1 tablespoon of honey in warm water and drink this to soothe your throat.  Place a humidifier in your room at  night to help decrease dry air that can irritate your airway and cause you to have a sore throat and cough.  Please try to eat a well-balanced diet while you are sick so that your body gets proper nutrition to heal.  If you develop any new or worsening symptoms, please return.  If your symptoms are severe, please go to the emergency room.  Follow-up with your primary care provider for further evaluation and management of your symptoms as well as ongoing wellness visits.  I hope you feel better!       ED Prescriptions     Medication Sig Dispense Auth. Provider   promethazine-dextromethorphan (PROMETHAZINE-DM) 6.25-15 MG/5ML syrup  Take 5 mLs by mouth at bedtime as needed for cough. 118 mL Reita MayStanhope, Ngan Qualls M, FNP   benzonatate (TESSALON) 100 MG capsule Take 1 capsule (100 mg total) by mouth every 8 (eight) hours. 21 capsule Carlisle BeersStanhope, Lus Kriegel M, FNP      PDMP not reviewed this encounter.   Carlisle BeersStanhope, Rameen Gohlke M, [place]NP 03/10/22 1447

## 2022-03-07 NOTE — ED Triage Notes (Signed)
Cough, headache, fever, diarrhea, starting three days ago. Son is currently ill with same symptoms.  Taking imodium, advil, nasal drop, and robitussin to help with symptoms at home.

## 2022-03-07 NOTE — Discharge Instructions (Signed)
You have a viral upper respiratory infection.   Purchase mucinex (guaifenesin) 1200mg  and take this every 12 hours for the next few days to thin your nasal congestion and mucous so that you are able to get out of your body easier by coughing and blowing your nose. Drink plenty of water while taking this.  Take Promethazine DM cough medication to help with your cough at nighttime so that you are able to sleep. Do not drive, drink alcohol, or go to work while taking this medication since it can make you sleepy. Only take this at nighttime.   Take tessalon pearles every 8 hours as needed for cough.  You may take tylenol 1,000mg  and ibuprofen 600mg  every 6 hours with food as needed for fever/chills, sore throat, aches/pains, and inflammation associated with viral illness. Take this with food to avoid stomach upset.    You may do salt water and baking soda gargles every 4 hours as needed for your throat pain.  Please put 1 teaspoon of salt and 1/2 teaspoon of baking soda in 8 ounces of warm water then gargle and spit the water out. You may also put 1 tablespoon of honey in warm water and drink this to soothe your throat.  Place a humidifier in your room at night to help decrease dry air that can irritate your airway and cause you to have a sore throat and cough.  Please try to eat a well-balanced diet while you are sick so that your body gets proper nutrition to heal.  If you develop any new or worsening symptoms, please return.  If your symptoms are severe, please go to the emergency room.  Follow-up with your primary care provider for further evaluation and management of your symptoms as well as ongoing wellness visits.  I hope you feel better!

## 2022-03-09 ENCOUNTER — Ambulatory Visit: Payer: Medicaid Other

## 2022-03-10 ENCOUNTER — Telehealth: Payer: Medicaid Other | Admitting: Physician Assistant

## 2022-03-10 DIAGNOSIS — J069 Acute upper respiratory infection, unspecified: Secondary | ICD-10-CM | POA: Diagnosis not present

## 2022-03-10 MED ORDER — NAPROXEN 500 MG PO TABS: 500.0000 mg | ORAL_TABLET | Freq: Two times a day (BID) | ORAL | 0 refills | Status: DC

## 2022-03-10 MED ORDER — IPRATROPIUM BROMIDE 0.03 % NA SOLN: 2.0000 | Freq: Two times a day (BID) | NASAL | 0 refills | Status: DC

## 2022-03-10 NOTE — Progress Notes (Signed)
E-Visit for Upper Respiratory Infection   We are sorry you are not feeling well.  Here is how we plan to help!  Based on what you have shared with me, it looks like you may have a viral upper respiratory infection.  Upper respiratory infections are caused by a large number of viruses; however, rhinovirus is the most common cause.   Symptoms vary from person to person, with common symptoms including sore throat, cough, fatigue or lack of energy and feeling of general discomfort.  A low-grade fever of up to 100.4 may present, but is often uncommon.  Symptoms vary however, and are closely related to a person's age or underlying illnesses.  The most common symptoms associated with an upper respiratory infection are nasal discharge or congestion, cough, sneezing, headache and pressure in the ears and face.  These symptoms usually persist for about 3 to 10 days, but can last up to 2 weeks.  It is important to know that upper respiratory infections do not cause serious illness or complications in most cases.    Upper respiratory infections can be transmitted from person to person, with the most common method of transmission being a person's hands.  The virus is able to live on the skin and can infect other persons for up to 2 hours after direct contact.  Also, these can be transmitted when someone coughs or sneezes; thus, it is important to cover the mouth to reduce this risk.  To keep the spread of the illness at bay, good hand hygiene is very important.  This is an infection that is most likely caused by a virus. There are no specific treatments other than to help you with the symptoms until the infection runs its course.  We are sorry you are not feeling well.  Here is how we plan to help!   For nasal congestion, you may use an oral decongestants such as Mucinex D or if you have glaucoma or high blood pressure use plain Mucinex.  Saline nasal spray or nasal drops can help and can safely be used as often as  needed for congestion.  For your congestion, I have prescribed Ipratropium Bromide nasal spray 0.03% two sprays in each nostril 2-3 times a day  If you do not have a history of heart disease, hypertension, diabetes or thyroid disease, prostate/bladder issues or glaucoma, you may also use Sudafed to treat nasal congestion.  It is highly recommended that you consult with a pharmacist or your primary care physician to ensure this medication is safe for you to take.     If you have a cough, you may use cough suppressants such as Delsym and Robitussin.  If you have glaucoma or high blood pressure, you can also use Coricidin HBP.   Continue cough medications previously prescribed.   If you have a sore or scratchy throat, use a saltwater gargle-  to  teaspoon of salt dissolved in a 4-ounce to 8-ounce glass of warm water.  Gargle the solution for approximately 15-30 seconds and then spit.  It is important not to swallow the solution.  You can also use throat lozenges/cough drops and Chloraseptic spray to help with throat pain or discomfort.  Warm or cold liquids can also be helpful in relieving throat pain.  For headache, pain or general discomfort, you can use Tylenol as directed.   Some authorities believe that zinc sprays or the use of Echinacea may shorten the course of your symptoms. I have prescribed Naproxen 500mg  Take  1 tablet every 12 hours as needed for body aches and pains.    HOME CARE Only take medications as instructed by your medical team. Be sure to drink plenty of fluids. Water is fine as well as fruit juices, sodas and electrolyte beverages. You may want to stay away from caffeine or alcohol. If you are nauseated, try taking small sips of liquids. How do you know if you are getting enough fluid? Your urine should be a pale yellow or almost colorless. Get rest. Taking a steamy shower or using a humidifier may help nasal congestion and ease sore throat pain. You can place a towel over your  head and breathe in the steam from hot water coming from a faucet. Using a saline nasal spray works much the same way. Cough drops, hard candies and sore throat lozenges may ease your cough. Avoid close contacts especially the very young and the elderly Cover your mouth if you cough or sneeze Always remember to wash your hands.   GET HELP RIGHT AWAY IF: You develop worsening fever. If your symptoms do not improve within 10 days You develop yellow or green discharge from your nose over 3 days. You have coughing fits You develop a severe head ache or visual changes. You develop shortness of breath, difficulty breathing or start having chest pain Your symptoms persist after you have completed your treatment plan  MAKE SURE YOU  Understand these instructions. Will watch your condition. Will get help right away if you are not doing well or get worse.  Thank you for choosing an e-visit.  Your e-visit answers were reviewed by a board certified advanced clinical practitioner to complete your personal care plan. Depending upon the condition, your plan could have included both over the counter or prescription medications.  Please review your pharmacy choice. Make sure the pharmacy is open so you can pick up prescription now. If there is a problem, you may contact your provider through [employer] and have the prescription routed to another pharmacy.  Your safety is important to [place]. If you have drug allergies check your prescription carefully.   For the next 24 hours you can use MyChart to ask questions about today's visit, request a non-urgent call back, or ask for a work or school excuse. You will get an email in the next two days asking about your experience. I hope that your e-visit has been valuable and will speed your recovery.  I have spent 5 minutes in review of e-visit questionnaire, review and updating patient chart, medical decision making and response to patient.   Margaretann Loveless, PA-C

## 2022-04-25 ENCOUNTER — Telehealth: Payer: Medicaid Other | Admitting: Physician Assistant

## 2022-04-25 DIAGNOSIS — L03039 Cellulitis of unspecified toe: Secondary | ICD-10-CM

## 2022-04-25 NOTE — Progress Notes (Signed)
E-Visit for Simple Cut/Laceration  We are sorry that you have had an injury. Here is how we plan to help!  Based on what you have shared it looks like you may have a small infection along the nail called a paronychia.  I recommend warm soaks several times a day.  Ultimately, it may need to be drained.  This can be done in Urgent Care or at your Primary Care Physician's office.    HOME CARE: Clean the cut or scrape - Wash it well with soap and water. * avoid using hydrogen peroxide which may cause tissue damage, or impede wound healing.  Stop the bleeding - If your cut or scrape is bleeding, press a clean cloth or bandage firmly on the area for 20 minutes. You can also help slow the bleeding by holding the cut above the level of your heart.   Put a thin layer of Bacitracin antibiotic ointment on the cut or scrape. (this can be purchased at any local pharmacy- ask your pharmacist if you need assistance)   Cover the cut or scrape with a bandage or gauze. Keep the bandage clean and dry. Change the bandage 1 to 2 times every day until your cut or scrape heals.   Watch for signs that your cut or scrape is infected (redness, drainage, pain, warmth, swelling or fever)  Over the next 48 hours your wound should start to improve with less pain, less swelling and less redness. If you should develop increasing pain, swelling, redness, fever, pus from the wound you should be seen immediately to make sure this is not becoming infected.   WOUND CARE: Please keep a layer of antibiotic ointment (bacitracin preferred) on this wound at least twice a day for the next seven days and keep a sterile dressing over top of it. You may gently clean the wound with warm soap and water between dressing changes.  We strongly recommend that you have a medical provider reevaluate your wound within 2 to 3 days in person to make sure that it is healing appropriately.  Thank you for choosing an e-visit.  Your e-visit answers  were reviewed by a board certified advanced clinical practitioner to complete your personal care plan. Depending upon the condition, your plan could have included both over the counter or prescription medications.  Please review your pharmacy choice. Make sure the pharmacy is open so you can pick up prescription now. If there is a problem, you may contact your provider through [employer] and have the prescription routed to another pharmacy.  Your safety is important to [place]. If you have drug allergies check your prescription carefully.   For the next 24 hours you can use MyChart to ask questions about today's visit, request a non-urgent call back, or ask for a work or school excuse. You will get an email in the next two days asking about your experience. I hope that your e-visit has been valuable and will speed your recovery.   I have spent 5 minutes in review of e-visit questionnaire, review and updating patient chart, medical decision making and response to patient.   Lenise Arena Ward, PA-C

## 2022-04-29 ENCOUNTER — Telehealth: Payer: Medicaid Other | Admitting: Physician Assistant

## 2022-04-29 DIAGNOSIS — L03032 Cellulitis of left toe: Secondary | ICD-10-CM

## 2022-04-29 MED ORDER — CEPHALEXIN 500 MG PO CAPS: 500.0000 mg | ORAL_CAPSULE | Freq: Three times a day (TID) | ORAL | 0 refills | Status: DC

## 2022-04-29 NOTE — Progress Notes (Signed)
I have spent 5 minutes in review of e-visit questionnaire, review and updating patient chart, medical decision making and response to patient.   Jolyn Deshmukh Cody Adaleigh Warf, PA-C    

## 2022-04-29 NOTE — Progress Notes (Signed)
E Visit for Rash  We are sorry that you are not feeling well. Here is how we plan to help!  Based on what you have shared with me it seems you have what we call paronychia of your great toe. This is mostly caused from an infection that gets under the skin/nail bed due to break in the skin (oftentimes from ingrown nail). Keep the skin clean and dry. I recommend soaking in a basin of warm water and 1/4 cup epsom salt for 10 minutes or so 1-2 x daily. Dry completely. Start the antibiotic I have sent in (Keflex) taking as directed. Schedule follow-up appointment with your PCP for next week in case this is not resolving as expected.  GET HELP RIGHT AWAY IF:  Symptoms don't go away after treatment. Severe itching that persists. If you rash spreads or swells. If you rash begins to smell. If it blisters and opens or develops a yellow-brown crust. You develop a fever. You have a sore throat. You become short of breath.  MAKE SURE YOU:  Understand these instructions. Will watch your condition. Will get help right away if you are not doing well or get worse.  Thank you for choosing an e-visit.  Your e-visit answers were reviewed by a board certified advanced clinical practitioner to complete your personal care plan. Depending upon the condition, your plan could have included both over the counter or prescription medications.  Please review your pharmacy choice. Make sure the pharmacy is open so you can pick up prescription now. If there is a problem, you may contact your provider through [employer] and have the prescription routed to another pharmacy.  Your safety is important to [place]. If you have drug allergies check your prescription carefully.   For the next 24 hours you can use MyChart to ask questions about today's visit, request a non-urgent call back, or ask for a work or school excuse. You will get an email in the next two days asking about your experience. I hope that your e-visit  has been valuable and will speed your recovery.

## 2022-05-02 ENCOUNTER — Ambulatory Visit (HOSPITAL_COMMUNITY)
Admission: EM | Admit: 2022-05-02 | Discharge: 2022-05-02 | Disposition: A | Discharge status: Home / Self Care | Payer: Medicaid Other | Attending: Internal Medicine | Admitting: Internal Medicine

## 2022-05-02 ENCOUNTER — Encounter (HOSPITAL_COMMUNITY): Payer: Self-pay | Admitting: *Deleted

## 2022-05-02 DIAGNOSIS — L03032 Cellulitis of left toe: Secondary | ICD-10-CM | POA: Diagnosis not present

## 2022-05-02 DIAGNOSIS — L600 Ingrowing nail: Secondary | ICD-10-CM

## 2022-05-02 MED ORDER — CEPHALEXIN 500 MG PO CAPS: 500.0000 mg | ORAL_CAPSULE | Freq: Three times a day (TID) | ORAL | 0 refills | Status: AC

## 2022-05-02 MED ORDER — LIDOCAINE HCL (PF) 1 % IJ SOLN
INTRAMUSCULAR | Status: AC
  Filled 2022-05-02: qty 2

## 2022-05-02 MED ORDER — LIDOCAINE HCL (PF) 2 % IJ SOLN
INTRAMUSCULAR | Status: AC
  Filled 2022-05-02: qty 5

## 2022-05-02 NOTE — ED Triage Notes (Signed)
C/O "infected" left great toe onset approx 4 days ago; states did an e-visit 2 days ago -- was told to soak in hot water and an OTC topical spray; states also received Rx for abx but "still waiting for my job to fill it, bc that's where I get my Rxs";  states pain worsening.

## 2022-05-02 NOTE — ED Provider Notes (Addendum)
[facility]    CSN: [id number] Arrival date & time: 05/02/22  1005      History   Chief Complaint Chief Complaint  Patient presents with   Toe Pain    HPI Tammy Frost is a 42 y.o. female.   Patient presents to urgent care for evaluation of pain to the left great toenail that started approximately 4 days ago. She denies known injury/trauma to the left great toenail, but believes the toenail to have become ingrown causing pain and swelling to the medial nailbed. Denies numbness/tingling distally and decreased range of motion of the affected toe. She did an E-Visit for the toe pain where she was prescribed antibiotics and advised to soak the toe in warm epsom salt water, however she has not been able to pick up the antibiotic yet and has not started to take it. She has been using OTC medications for pain without relief.      Past Medical History:  Diagnosis Date   Abdominal gas pain 12/25/2019   Anxiety    Chicken pox    Chronic cholecystitis with calculus    Depression    Gallstones    UTI (urinary tract infection) 07-20-15    Patient Active Problem List   Diagnosis Date Noted   Closed fracture of tooth 05/02/2020   URI with cough and congestion 01/25/2020   Upper respiratory tract infection 01/25/2020   Body mass index (BMI) 36.0-36.9, adult 01/25/2020   Low serum vitamin B12 01/25/2020   Vitamin D deficiency 01/25/2020   Anxiety and depression 11/08/2019   Encounter for preventative adult health care examination 11/08/2019   Situational anxiety 11/01/2019   Obesity, morbid, BMI 40.0-49.9 ([place]) 11/01/2019    Past Surgical History:  Procedure Laterality Date   APPENDECTOMY     CESAREAN SECTION  12/27/2011   CESAREAN SECTION  12/31/2013   CHOLECYSTECTOMY N/A 09/02/2015   Procedure: LAPAROSCOPIC CHOLECYSTECTOMY WITH INTRAOPERATIVE CHOLANGIOGRAM;  Surgeon: Hubbard Robinson, MD;  Location: [facility] ORS;  Service: General;  Laterality: N/A;   LAPAROSCOPIC  APPENDECTOMY N/A 09/04/2017   Procedure: APPENDECTOMY LAPAROSCOPIC;  Surgeon: Jules Husbands, MD;  Location: [facility] OR[facility];  Service: General;  Laterality: N/A;    OB History     Gravida  1   Para      Term      Preterm      AB      Living         SAB      IAB      Ectopic      Multiple      Live Births               Home Medications    Prior to Admission medications   Medication Sig Start Date End Date Taking? Authorizing Provider  ibuprofen (ADVIL) 800 MG tablet Take 1 tablet (800 mg total) by mouth every 8 (eight) hours as needed (pain). 08/18/21  Yes Banister, Gwenlyn Perking, MD  benzonatate (TESSALON) 100 MG capsule Take 1 capsule (100 mg total) by mouth every 8 (eight) hours. 03/07/22   Talbot Grumbling, FNP  cephALEXin (KEFLEX) 500 MG capsule Take 1 capsule (500 mg total) by mouth 3 (three) times daily for 7 days. 05/02/22 05/09/22  Talbot Grumbling, FNP  chlorhexidine (PERIDEX) 0.12 % solution Use as directed 15 mLs in the mouth or throat 2 (two) times daily. 12/20/20   Gildardo Pounds, NP  cyanocobalamin (,VITAMIN B-12,) 1000 MCG/ML injection  1000 mcg (1 mg) injection once per week for four weeks, followed by 1000 mcg injection once per month. 11/08/19   Marval Regal, NP  ergocalciferol (VITAMIN D2) 1.25 MG (50000 UT) capsule Take 1 capsule (50,000 Units total) by mouth once a week. 09/21/21   McDonough, Lauren K, PA-C  fluticasone (FLONASE) 50 MCG/ACT nasal spray Place 2 sprays into both nostrils daily. 11/09/21   Apolonio Schneiders, FNP  hydrOXYzine (ATARAX) 10 MG tablet Take 1 tablet (10 mg total) by mouth 3 (three) times daily as needed. 11/26/21   McDonough, Lauren K, PA-C  ipratropium (ATROVENT) 0.03 % nasal spray Place 2 sprays into both nostrils every 12 (twelve) hours. 03/10/22   Mar Daring, PA-C  naproxen (NAPROSYN) 500 MG tablet Take 1 tablet (500 mg total) by mouth 2 (two) times daily with a meal. 03/10/22   Burnette, Clearnce Sorrel, PA-C   neomycin-polymyxin-hydrocortisone (CORTISPORIN) 3.5-10000-1 OTIC suspension Place 4 drops into the right ear 3 (three) times daily. 11/19/21   Nyoka Lint, PA-C  ondansetron (ZOFRAN ODT) 4 MG disintegrating tablet Take 1 tablet (4 mg total) by mouth every 8 (eight) hours as needed for nausea or vomiting. 09/05/21   Merlyn Lot, MD  promethazine-dextromethorphan (PROMETHAZINE-DM) 6.25-15 MG/5ML syrup Take 5 mLs by mouth at bedtime as needed for cough. 03/07/22   Talbot Grumbling, FNP  albuterol (PROVENTIL HFA;VENTOLIN HFA) 108 (90 Base) MCG/ACT inhaler Inhale 2 puffs into the lungs every 6 (six) hours as needed for wheezing or shortness of breath. Patient not taking: Reported on 07/10/2018 10/03/17 09/19/18  Rudene Re, MD    Family History Family History  Problem Relation Age of Onset   Cancer Maternal Grandmother        lung   Emphysema Maternal Grandmother    Depression Maternal Grandmother    Heart disease Maternal Grandmother    Hyperlipidemia Maternal Grandmother    Hypertension Maternal Grandmother    Stroke Maternal Grandmother    Cancer Maternal Grandfather        pancreatic   Hypertension Mother    Depression Mother    Hyperlipidemia Mother    Asthma Son    Cancer Paternal Grandmother    Cancer Paternal Grandfather    Heart disease Paternal Grandfather    Alcohol abuse Neg Hx    Arthritis Neg Hx    Birth defects Neg Hx    COPD Neg Hx    Diabetes Neg Hx    Drug abuse Neg Hx    Early death Neg Hx    Hearing loss Neg Hx    Kidney disease Neg Hx    Learning disabilities Neg Hx    Mental illness Neg Hx    Mental retardation Neg Hx    Miscarriages / Stillbirths Neg Hx    Vision loss Neg Hx    Varicose Veins Neg Hx     Social History Social History   Tobacco Use   Smoking status: Never   Smokeless tobacco: Never  Vaping Use   Vaping Use: Never used  Substance Use Topics   Alcohol use: No   Drug use: Never     Allergies   Sulfa antibiotics  and Flagyl [metronidazole]   Review of Systems Review of Systems Per HPI  Physical Exam Triage Vital Signs ED Triage Vitals  Enc Vitals Group     BP 05/02/22 1035 133/84     Pulse Rate 05/02/22 1035 79     Resp 05/02/22 1035 18     Temp  05/02/22 1035 98.1 F (36.7 C)     Temp Source 05/02/22 1035 Oral     SpO2 05/02/22 1035 98 %     Weight --      Height --      Head Circumference --      Peak Flow --      Pain Score 05/02/22 1036 4     Pain Loc --      Pain Edu? --      Excl. in [place]? --    No data found.  Updated Vital Signs BP 133/84   Pulse 79   Temp 98.1 F (36.7 C) (Oral)   Resp 18   LMP 04/29/2022 (Exact Date)   SpO2 98%   Visual Acuity Right Eye Distance:   Left Eye Distance:   Bilateral Distance:    Right Eye Near:   Left Eye Near:    Bilateral Near:     Physical Exam Vitals and nursing note reviewed.  Constitutional:      Appearance: She is not ill-appearing or toxic-appearing.  HENT:     Head: Normocephalic and atraumatic.     Right Ear: Hearing and external ear normal.     Left Ear: Hearing and external ear normal.     Nose: Nose normal.     Mouth/Throat:     Lips: Pink.  Eyes:     General: Lids are normal. Vision grossly intact. Gaze aligned appropriately.     Extraocular Movements: Extraocular movements intact.     Conjunctiva/sclera: Conjunctivae normal.  Pulmonary:     Effort: Pulmonary effort is normal.  Musculoskeletal:     Cervical back: Neck supple.  Feet:     Left foot:     Toenail Condition: Left toenails are ingrown.     Comments: Ingrown toenail to the medial aspect of the left great toe. Cap refill less than 3, full ROM to the affected digit, sensation and strength intact distally. See image below for further detail.  Skin:    General: Skin is warm and dry.     Capillary Refill: Capillary refill takes less than 2 seconds.     Findings: No rash.  Neurological:     General: No focal deficit present.     Mental Status:  She is alert and oriented to person, place, and time. Mental status is at baseline.     Cranial Nerves: No dysarthria or facial asymmetry.  Psychiatric:        Mood and Affect: Mood normal.        Speech: Speech normal.        Behavior: Behavior normal.        Thought Content: Thought content normal.        Judgment: Judgment normal.       UC Treatments / Results  Labs (all labs ordered are listed, but only abnormal results are displayed) Labs Reviewed - No data to display  EKG   Radiology No results found.  Procedures Excise Ingrown Toenail  Date/Time: 05/02/2022 12:20 PM  Performed by: Talbot Grumbling, FNP Authorized by: Talbot Grumbling, FNP   Consent:    Consent obtained:  Verbal   Consent given by:  Parent and patient   Risks, benefits, and alternatives were discussed: yes     Risks discussed:  Bleeding, incomplete removal, infection, pain and permanent nail deformity   Alternatives discussed:  No treatment Universal protocol:    Patient identity confirmed:  Verbally with patient Location:  Foot:  L big toe Pre-procedure details:    Skin preparation:  Povidone-iodine Anesthesia:    Anesthesia method:  Local infiltration and nerve block   Local anesthetic:  Lidocaine 2% w/o epi   Block location:  Left great toe distally   Block needle gauge:  27 G   Block anesthetic:  Lidocaine 2% w/o epi   Block injection procedure:  Anatomic landmarks identified, anatomic landmarks palpated, negative aspiration for blood, introduced needle and incremental injection   Block outcome:  Anesthesia achieved Ingrown nail:    Wedge excision of skin: yes     Nail matrix removed or ablated:  Partial Nails trimmed:    Number of nails trimmed:  1 Post-procedure details:    Dressing:  4x4 sterile gauze (Nonstick gauze and coban wrap)   Procedure completion:  Tolerated well, no immediate complications  (including critical care time)  Medications Ordered in  UC Medications - No data to display  Initial Impression / Assessment and Plan / UC Course  I have reviewed the triage vital signs and the nursing notes.  Pertinent labs & imaging results that were available during my care of the patient were reviewed by me and considered in my medical decision making (see chart for details).   1. Ingrown toenail of left big toenail Ingrown toenail to the left big toenail excised. See procedure note above for further detail. Bleeding controlled, wound cleansed and dressed with non-stick gauze/coban wrap in clinic. Epsom salt soaks advised, keflex antibiotic sent to pharmacy to be taken 3 times daily for the next 7 days to treat infection. Walking referral to [facility] provided for follow-up evaluation and ongoing management of symptoms. Ibuprofen '600mg'$  every 6 hours may be used as needed for pain.  Discussed physical exam and available lab work findings in clinic with patient.  Counseled patient regarding appropriate use of medications and potential side effects for all medications recommended or prescribed today. Discussed red flag signs and symptoms of worsening condition,when to call the PCP office, return to urgent care, and when to seek higher level of care in the emergency department. Patient verbalizes understanding and agreement with plan. All questions answered. Patient discharged in stable condition.    Final Clinical Impressions(s) / UC Diagnoses   Final diagnoses:  Ingrown left big toenail     Discharge Instructions      I removed your ingrown toenail today. Pick up keflex antibiotic at the pharmacy and take this as directed for the next 7 days.  Keep the wound clean and change the bandage at least 2 times a day with non-stick gauze and coban wrap. Take ibuprofen/tylenol as needed for pain and swelling. Perform epsom salt soaks every few hours while at home for the next 3-4 days to reduce swelling and cleanse the wound.  Schedule  an appointment with the podiatrist listed on your paperwork ([facility]) for follow-up evaluation for 1 week. I would like for you to continue seeing them as the toenail grows back out so that the toenail grows out correctly.  Return to urgent care as needed for any new signs of infection such as redness, swelling, pus coming from the wound, or worsening severe pain. I hope you feel better!     ED Prescriptions     Medication Sig Dispense Auth. Provider   cephALEXin (KEFLEX) 500 MG capsule Take 1 capsule (500 mg total) by mouth 3 (three) times daily for 7 days. 21 capsule Talbot Grumbling, FNP  PDMP not reviewed this encounter.   Talbot Grumbling, FNP 05/05/22 2151    Talbot Grumbling, FNP 05/13/22 1043

## 2022-05-02 NOTE — Discharge Instructions (Signed)
I removed your ingrown toenail today. Pick up keflex antibiotic at the pharmacy and take this as directed for the next 7 days.  Keep the wound clean and change the bandage at least 2 times a day with non-stick gauze and coban wrap. Take ibuprofen/tylenol as needed for pain and swelling. Perform epsom salt soaks every few hours while at home for the next 3-4 days to reduce swelling and cleanse the wound.  Schedule an appointment with the podiatrist listed on your paperwork ([facility]) for follow-up evaluation for 1 week. I would like for you to continue seeing them as the toenail grows back out so that the toenail grows out correctly.  Return to urgent care as needed for any new signs of infection such as redness, swelling, pus coming from the wound, or worsening severe pain. I hope you feel better!

## 2022-05-03 ENCOUNTER — Ambulatory Visit (HOSPITAL_COMMUNITY): Payer: Medicaid Other

## 2022-05-05 ENCOUNTER — Ambulatory Visit: Payer: Medicaid Other | Admitting: Podiatry

## 2022-05-21 ENCOUNTER — Telehealth: Payer: Medicaid Other | Admitting: Physician Assistant

## 2022-05-21 DIAGNOSIS — H109 Unspecified conjunctivitis: Secondary | ICD-10-CM | POA: Diagnosis not present

## 2022-05-21 MED ORDER — POLYMYXIN B-TRIMETHOPRIM 10000-0.1 UNIT/ML-% OP SOLN: 1.0000 [drp] | OPHTHALMIC | 0 refills | Status: DC

## 2022-05-21 NOTE — Progress Notes (Signed)

## 2022-05-27 ENCOUNTER — Ambulatory Visit (INDEPENDENT_AMBULATORY_CARE_PROVIDER_SITE_OTHER): Payer: Medicaid Other | Admitting: Physician Assistant

## 2022-05-27 ENCOUNTER — Encounter: Payer: Self-pay | Admitting: Physician Assistant

## 2022-05-27 VITALS — BP 120/70 | HR 82 | Temp 97.8°F | Resp 16 | Ht 62.0 in | Wt 202.0 lb

## 2022-05-27 DIAGNOSIS — F411 Generalized anxiety disorder: Secondary | ICD-10-CM | POA: Diagnosis not present

## 2022-05-27 DIAGNOSIS — E538 Deficiency of other specified B group vitamins: Secondary | ICD-10-CM | POA: Diagnosis not present

## 2022-05-27 DIAGNOSIS — E782 Mixed hyperlipidemia: Secondary | ICD-10-CM

## 2022-05-27 DIAGNOSIS — Z87828 Personal history of other (healed) physical injury and trauma: Secondary | ICD-10-CM

## 2022-05-27 DIAGNOSIS — R7989 Other specified abnormal findings of blood chemistry: Secondary | ICD-10-CM | POA: Diagnosis not present

## 2022-05-27 DIAGNOSIS — E559 Vitamin D deficiency, unspecified: Secondary | ICD-10-CM | POA: Diagnosis not present

## 2022-05-27 DIAGNOSIS — R5383 Other fatigue: Secondary | ICD-10-CM

## 2022-05-27 MED ORDER — HYDROXYZINE PAMOATE 25 MG PO CAPS: 25.0000 mg | ORAL_CAPSULE | Freq: Three times a day (TID) | ORAL | 2 refills | Status: DC | PRN

## 2022-05-27 NOTE — Progress Notes (Signed)
[facility] [place], [place] [address]  Internal MEDICINE  Office Visit Note  Patient Name: Tammy Frost  [id number]  [id number]  Date of Service: 05/27/2022  Chief Complaint  Patient presents with   Follow-up   Anxiety    HPI Pt is here for routine follow up -Has noticed that panic attacks occur right before menstrual cycle starts. She states the '10mg'$  hydroxyzine helped a little, but not completely. We started at lower dose due to history of grogginess with anxiety medications. Will go ahead and increase to '25mg'$ . -Went to the dentist, after work injury to face. She was carrying a box and it must have been open and flipped and hit the left side of her face along mouth/jaw. She is taking amoxicillin '500mg'$  TID for 10 days, on day 4 now. She reports the dentist told her the swelling may get a little worse before it gets better which is the case, but she is monitoring and is going to call them if much more worsening. Using cold compresses. She has a follow up with dentist in 1 week. -Going to be seeing OBGYN at [facility] for pap as previous location she was trying does not have new patient openings still -She was doing b12 injections, but missed the last few due to work and is now taking oral B12 1000 daily. She also completed weekly drisdol and has switched to 1000 vit D daily. She is hoping to be able to continue with OTC oral dosing as this is much easier for her -Will plan for updated fasting labs  Current Medication: Outpatient Encounter Medications as of 05/27/2022  Medication Sig   cyanocobalamin (,VITAMIN B-12,) 1000 MCG/ML injection 1000 mcg (1 mg) injection once per week for four weeks, followed by 1000 mcg injection once per month.   ergocalciferol (VITAMIN D2) 1.25 MG (50000 UT) capsule Take 1 capsule (50,000 Units total) by mouth once a week.   hydrOXYzine (VISTARIL) 25 MG capsule Take 1 capsule (25 mg total) by mouth every 8 (eight) hours as needed.    ibuprofen (ADVIL) 800 MG tablet Take 1 tablet (800 mg total) by mouth every 8 (eight) hours as needed (pain).   naproxen (NAPROSYN) 500 MG tablet Take 1 tablet (500 mg total) by mouth 2 (two) times daily with a meal.   trimethoprim-polymyxin b (POLYTRIM) ophthalmic solution Place 1 drop into the left eye every 4 (four) hours. X 5 days   [DISCONTINUED] albuterol (PROVENTIL HFA;VENTOLIN HFA) 108 (90 Base) MCG/ACT inhaler Inhale 2 puffs into the lungs every 6 (six) hours as needed for wheezing or shortness of breath. (Patient not taking: Reported on 07/10/2018)   No facility-administered encounter medications on file as of 05/27/2022.    Surgical History: Past Surgical History:  Procedure Laterality Date   APPENDECTOMY     CESAREAN SECTION  12/27/2011   CESAREAN SECTION  12/31/2013   CHOLECYSTECTOMY N/A 09/02/2015   Procedure: LAPAROSCOPIC CHOLECYSTECTOMY WITH INTRAOPERATIVE CHOLANGIOGRAM;  Surgeon: Hubbard Robinson, MD;  Location: [facility]S;  Service: General;  Laterality: N/A;   LAPAROSCOPIC APPENDECTOMY N/A 09/04/2017   Procedure: APPENDECTOMY LAPAROSCOPIC;  Surgeon: Jules Husbands, MD;  Location: [facility];  Service: General;  Laterality: N/A;    Medical History: Past Medical History:  Diagnosis Date   Abdominal gas pain 12/25/2019   Anxiety    Chicken pox    Chronic cholecystitis with calculus    Depression    Gallstones    UTI (urinary tract infection) 07-20-15  Family History: Family History  Problem Relation Age of Onset   Cancer Maternal Grandmother        lung   Emphysema Maternal Grandmother    Depression Maternal Grandmother    Heart disease Maternal Grandmother    Hyperlipidemia Maternal Grandmother    Hypertension Maternal Grandmother    Stroke Maternal Grandmother    Cancer Maternal Grandfather        pancreatic   Hypertension Mother    Depression Mother    Hyperlipidemia Mother    Asthma Son    Cancer Paternal Grandmother    Cancer Paternal Grandfather     Heart disease Paternal Grandfather    Alcohol abuse Neg Hx    Arthritis Neg Hx    Birth defects Neg Hx    COPD Neg Hx    Diabetes Neg Hx    Drug abuse Neg Hx    Early death Neg Hx    Hearing loss Neg Hx    Kidney disease Neg Hx    Learning disabilities Neg Hx    Mental illness Neg Hx    Mental retardation Neg Hx    Miscarriages / Stillbirths Neg Hx    Vision loss Neg Hx    Varicose Veins Neg Hx     Social History   Socioeconomic History   Marital status: Single    Spouse name: Not on file   Number of children: Not on file   Years of education: Not on file   Highest education level: Not on file  Occupational History   Occupation: unemployed  Tobacco Use   Smoking status: Never   Smokeless tobacco: Never  Vaping Use   Vaping Use: Never used  Substance and Sexual Activity   Alcohol use: No   Drug use: Never   Sexual activity: Yes    Birth control/protection: Condom  Other Topics Concern   Not on file  Social History Narrative   Single has  boyfriend and lives with mother Has 2 boys    Social Determinants of Health   Financial Resource Strain: Not on file  Food Insecurity: Not on file  Transportation Needs: Not on file  Physical Activity: Not on file  Stress: Not on file  Social Connections: Not on file  Intimate Partner Violence: Not on file      Review of Systems  Constitutional:  Negative for chills, fatigue and unexpected weight change.  HENT:  Positive for dental problem and facial swelling. Negative for congestion, postnasal drip, rhinorrhea, sneezing and sore throat.   Eyes:  Negative for redness.  Respiratory:  Negative for cough, chest tightness and shortness of breath.   Cardiovascular:  Negative for chest pain and palpitations.  Gastrointestinal:  Negative for abdominal pain, constipation, diarrhea, nausea and vomiting.  Genitourinary:  Negative for dysuria and frequency.  Musculoskeletal:  Negative for arthralgias, back pain, joint swelling and  neck pain.  Skin:  Negative for rash.  Neurological: Negative.  Negative for tremors and numbness.  Hematological:  Negative for adenopathy. Does not bruise/bleed easily.  Psychiatric/Behavioral:  Negative for behavioral problems (Depression), sleep disturbance and suicidal ideas. The patient is nervous/anxious.     Vital Signs: BP 120/70   Pulse 82   Temp 97.8 F (36.6 C)   Resp 16   Ht '5\' 2"'$  (1.575 m)   Wt 202 lb (91.6 kg)   LMP 04/29/2022 (Exact Date)   SpO2 98%   BMI 36.95 kg/m    Physical Exam Vitals and nursing note reviewed.  Constitutional:      General: She is not in acute distress.    Appearance: Normal appearance. She is well-developed. She is obese. She is not diaphoretic.  HENT:     Head: Normocephalic and atraumatic.     Right Ear: Tympanic membrane normal.     Left Ear: Tympanic membrane normal.     Mouth/Throat:     Pharynx: No oropharyngeal exudate.     Comments: Left side swelling over lower jaw, tender to palpation. Painful to open mouth wide Eyes:     Pupils: Pupils are equal, round, and reactive to light.  Neck:     Thyroid: No thyromegaly.     Vascular: No JVD.     Trachea: No tracheal deviation.  Cardiovascular:     Rate and Rhythm: Normal rate and regular rhythm.     Heart sounds: Normal heart sounds. No murmur heard.    No friction rub. No gallop.  Pulmonary:     Effort: Pulmonary effort is normal. No respiratory distress.     Breath sounds: No wheezing or rales.  Chest:     Chest wall: No tenderness.  Breasts:    Right: Normal. No mass.     Left: Normal. No mass.  Abdominal:     General: Bowel sounds are normal.     Palpations: Abdomen is soft.  Musculoskeletal:        General: Normal range of motion.     Cervical back: Normal range of motion and neck supple.  Lymphadenopathy:     Cervical: No cervical adenopathy.  Skin:    General: Skin is warm and dry.  Neurological:     Mental Status: She is alert and oriented to person,  place, and time.     Cranial Nerves: No cranial nerve deficit.  Psychiatric:        Behavior: Behavior normal.        Thought Content: Thought content normal.        Judgment: Judgment normal.        Assessment/Plan: 1. GAD (generalized anxiety disorder) Will increase to '25mg'$  hydroxyzine as needed for anxiety. Patient may call if further refills needed - hydrOXYzine (VISTARIL) 25 MG capsule; Take 1 capsule (25 mg total) by mouth every 8 (eight) hours as needed.  Dispense: 30 capsule; Refill: 2  2. Vitamin D deficiency Continue daily supplement and will update labs - VITAMIN D 25 Hydroxy (Vit-D Deficiency, Fractures)  3. History of dental trauma Followed by dentist and will complete ABX as prescribed  4. B12 deficiency Taking oral B12 now and will update labs - B12 and Folate Panel  5. Abnormal thyroid blood test - TSH + free T4  6. Mixed hyperlipidemia - Lipid Panel With LDL/HDL Ratio  7. Other fatigue - B12 and Folate Panel - VITAMIN D 25 Hydroxy (Vit-D Deficiency, Fractures) - Comprehensive metabolic panel - CBC w/Diff/Platelet - TSH + free T4 - Lipid Panel With LDL/HDL Ratio   General Counseling: Brandey verbalizes understanding of the findings of todays visit and agrees with plan of treatment. I have discussed any further diagnostic evaluation that may be needed or ordered today. We also reviewed her medications today. she has been encouraged to call the office with any questions or concerns that should arise related to todays visit.    Orders Placed This Encounter  Procedures   B12 and Folate Panel   VITAMIN D 25 Hydroxy (Vit-D Deficiency, Fractures)   Comprehensive metabolic panel   CBC w/Diff/Platelet   TSH +  free T4   Lipid Panel With LDL/HDL Ratio    Meds ordered this encounter  Medications   hydrOXYzine (VISTARIL) 25 MG capsule    Sig: Take 1 capsule (25 mg total) by mouth every 8 (eight) hours as needed.    Dispense:  30 capsule    Refill:  2     This patient was seen by Drema Dallas, PA-C in collaboration with Dr. Clayborn Bigness as a part of collaborative care agreement.   Total time spent:30 Minutes Time spent includes review of chart, medications, test results, and follow up plan with the patient.      Dr Lavera Guise Internal medicine

## 2022-08-20 IMAGING — CR DG CHEST 2V
2 series · 2 of 2 positions shown · non-contrast
Comparison: 09/04/2019

CLINICAL DATA: Cough

EXAM:
CHEST - 2 VIEW

[chest pa]
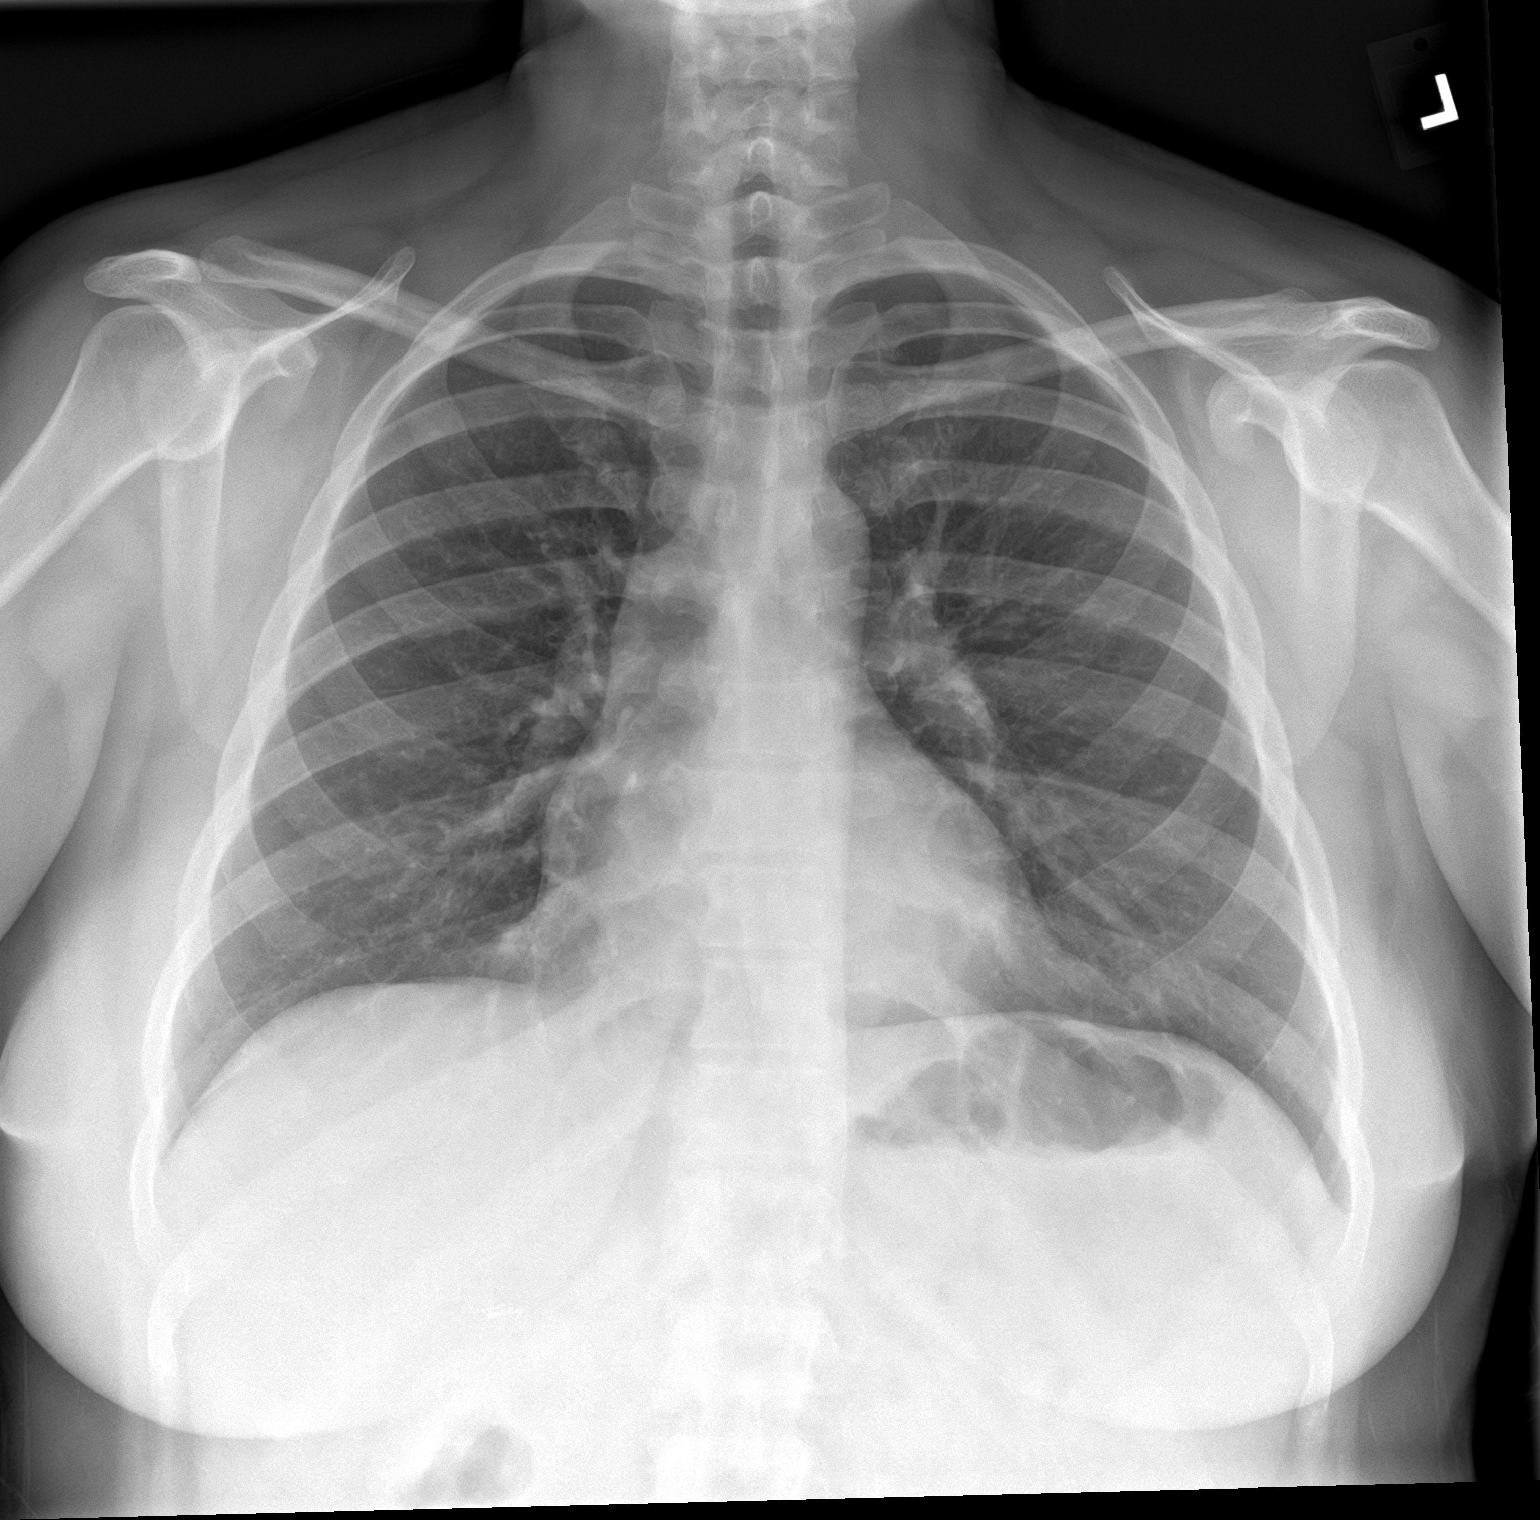

[chest lat]
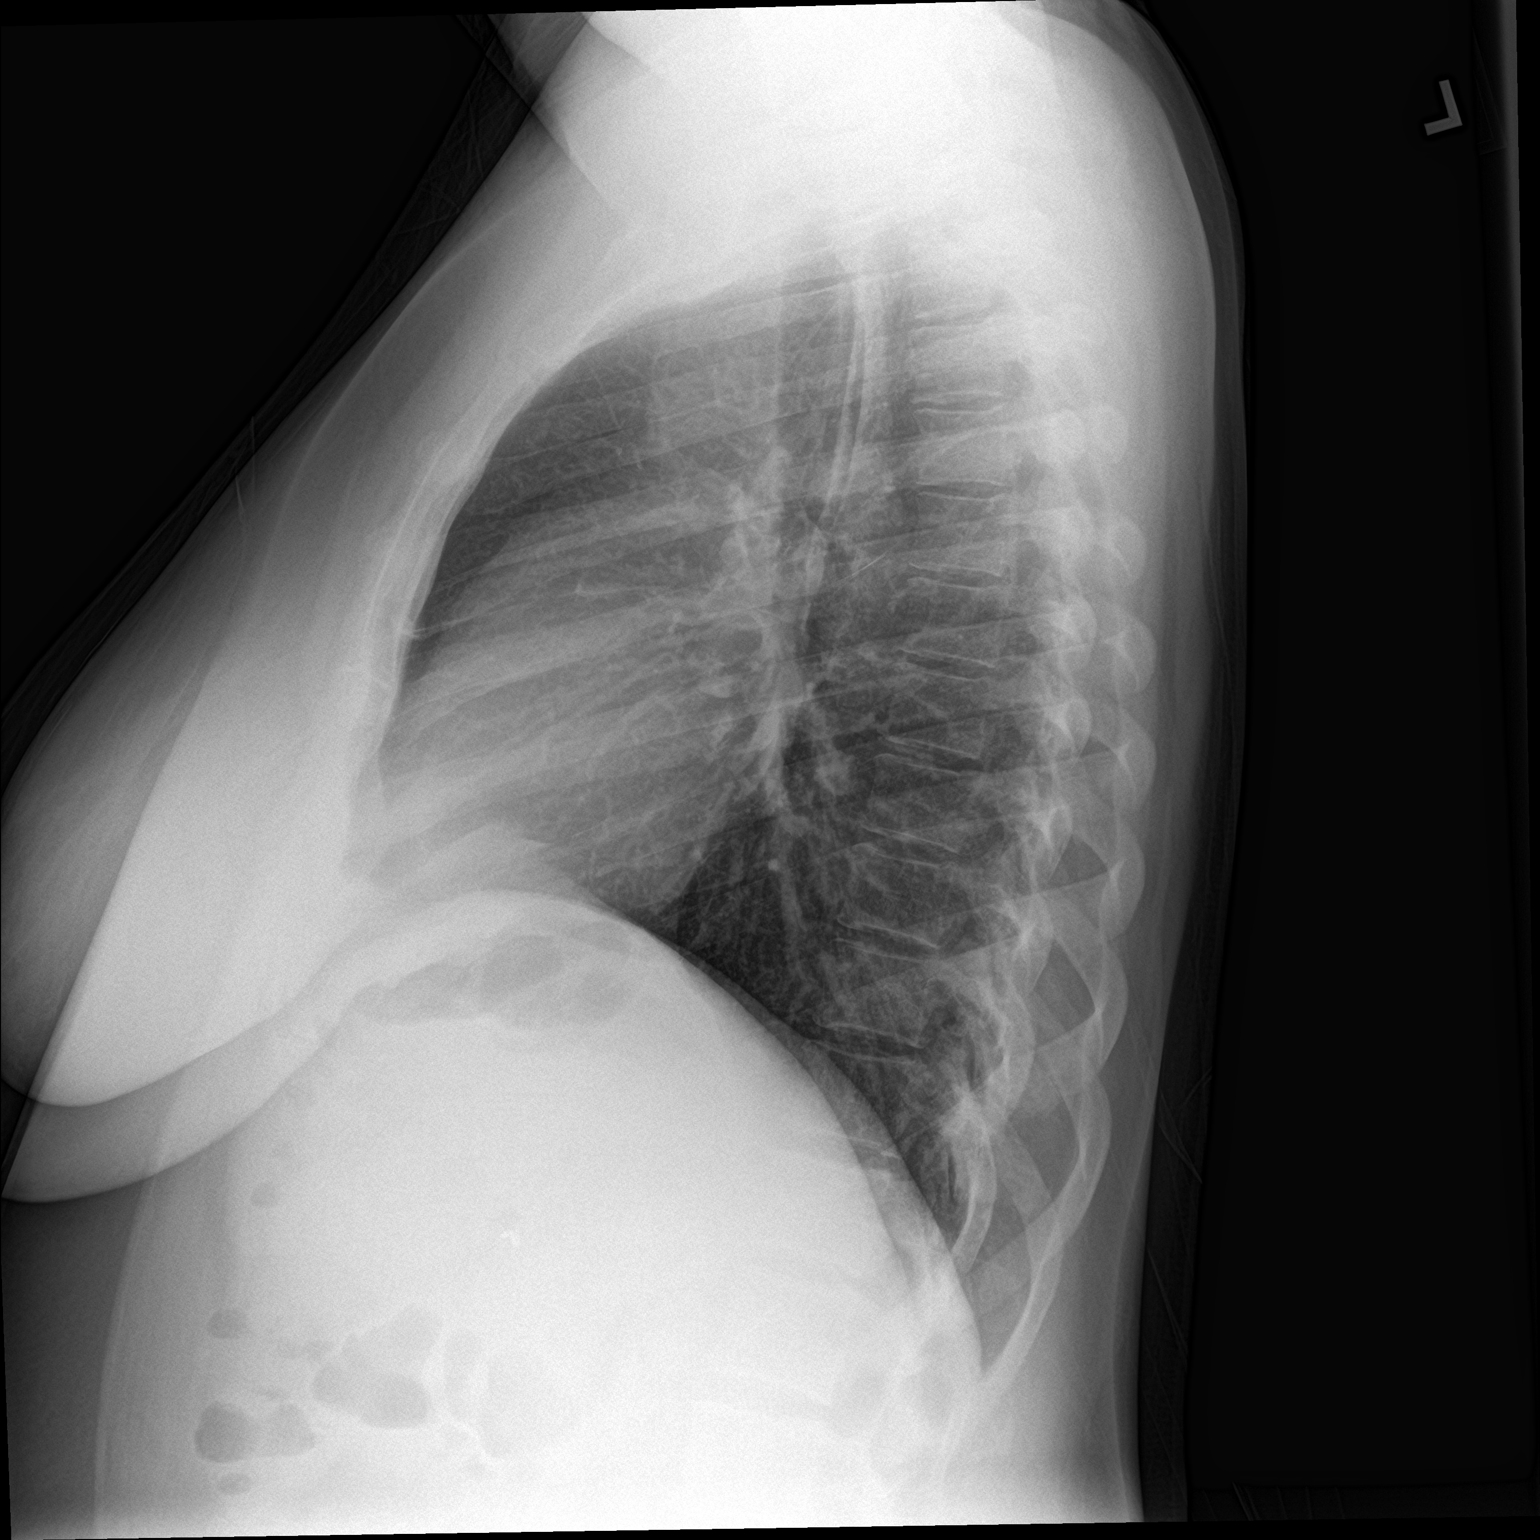

[2 of 2 positions shown; findings below may reference images not displayed]

FINDINGS: The heart size and mediastinal contours are within normal limits.
Both lungs are clear. The visualized skeletal structures are
unremarkable.
IMPRESSION: No active cardiopulmonary disease.

## 2022-08-21 ENCOUNTER — Telehealth: Payer: Medicaid Other | Admitting: Family

## 2022-08-21 DIAGNOSIS — K047 Periapical abscess without sinus: Secondary | ICD-10-CM

## 2022-08-21 DIAGNOSIS — K0889 Other specified disorders of teeth and supporting structures: Secondary | ICD-10-CM

## 2022-08-22 MED ORDER — AMOXICILLIN-POT CLAVULANATE 875-125 MG PO TABS: 1.0000 | ORAL_TABLET | Freq: Two times a day (BID) | ORAL | 0 refills | Status: DC

## 2022-08-22 NOTE — Progress Notes (Signed)

## 2022-09-14 ENCOUNTER — Encounter (HOSPITAL_COMMUNITY): Payer: Self-pay

## 2022-09-14 ENCOUNTER — Ambulatory Visit (HOSPITAL_COMMUNITY)
Admission: RE | Admit: 2022-09-14 | Discharge: 2022-09-14 | Disposition: A | Discharge status: Home / Self Care | Payer: Medicaid Other | Source: Ambulatory Visit | Attending: Internal Medicine | Admitting: Internal Medicine

## 2022-09-14 VITALS — BP 142/85 | HR 98 | Temp 98.6°F | Resp 17

## 2022-09-14 DIAGNOSIS — J3089 Other allergic rhinitis: Secondary | ICD-10-CM

## 2022-09-14 DIAGNOSIS — J40 Bronchitis, not specified as acute or chronic: Secondary | ICD-10-CM | POA: Diagnosis not present

## 2022-09-14 MED ORDER — PREDNISONE 20 MG PO TABS: 20.0000 mg | ORAL_TABLET | Freq: Every day | ORAL | 0 refills | Status: DC

## 2022-09-14 MED ORDER — FEXOFENADINE HCL 180 MG PO TABS: 180.0000 mg | ORAL_TABLET | Freq: Every day | ORAL | 0 refills | Status: DC

## 2022-09-14 MED ORDER — AZITHROMYCIN 250 MG PO TABS: ORAL_TABLET | ORAL | 0 refills | Status: DC

## 2022-09-14 NOTE — Discharge Instructions (Addendum)
Plese come back if the cough persists after done with the medication

## 2022-09-14 NOTE — ED Triage Notes (Signed)
Pt presents with c/o cough and congestion x 3 weeks. Has not gone away after taking DayQuil, NyQuil, Vicks and Robitussin.   Pt denies fever, N/V/D.

## 2022-09-14 NOTE — ED Provider Notes (Signed)
[facility]    CSN: [id number] Arrival date & time: 09/14/22  4098      History   Chief Complaint Chief Complaint  Patient presents with   Cough    Had this cough / congestion for a couple weeks. It is sometimes wet mostly dry and causes some chest discomfort. - Entered by patient    HPI Tammy Frost is a 42 y.o. female who had a cold 3 weeks ago, and the cough has not resolved since. Has not had a fever, sweats. She has had coughing fits and at time her cough is productive with white and yellow mucous. Denies hx of asthma. Has been sneezing some and having post nasal drainage.     Past Medical History:  Diagnosis Date   Abdominal gas pain 12/25/2019   Anxiety    Chicken pox    Chronic cholecystitis with calculus    Depression    Gallstones    UTI (urinary tract infection) 07-20-15    Patient Active Problem List   Diagnosis Date Noted   Closed fracture of tooth 05/02/2020   URI with cough and congestion 01/25/2020   Upper respiratory tract infection 01/25/2020   Body mass index (BMI) 36.0-36.9, adult 01/25/2020   Low serum vitamin B12 01/25/2020   Vitamin D deficiency 01/25/2020   Anxiety and depression 11/08/2019   Encounter for preventative adult health care examination 11/08/2019   Situational anxiety 11/01/2019   Obesity, morbid, BMI 40.0-49.9 (HCC) 11/01/2019    Past Surgical History:  Procedure Laterality Date   APPENDECTOMY     CESAREAN SECTION  12/27/2011   CESAREAN SECTION  12/31/2013   CHOLECYSTECTOMY N/A 09/02/2015   Procedure: LAPAROSCOPIC CHOLECYSTECTOMY WITH INTRAOPERATIVE CHOLANGIOGRAM;  Surgeon: Gladis Riffle, MD;  Location: [facility];  Service: General;  Laterality: N/A;   LAPAROSCOPIC APPENDECTOMY N/A 09/04/2017   Procedure: APPENDECTOMY LAPAROSCOPIC;  Surgeon: Leafy Ro, MD;  Location: [facility];  Service: General;  Laterality: N/A;    OB History     Gravida  1   Para      Term      Preterm      AB      Living          SAB      IAB      Ectopic      Multiple      Live Births               Home Medications    Prior to Admission medications   Medication Sig Start Date End Date Taking? Authorizing Provider  azithromycin (ZITHROMAX Z-PAK) 250 MG tablet Take 2 today, and one every day x 4 days 09/14/22  Yes Rodriguez-Southworth, Nettie Elm, PA-C  fexofenadine (ALLEGRA ALLERGY) 180 MG tablet Take 1 tablet (180 mg total) by mouth daily. 09/14/22  Yes Rodriguez-Southworth, Nettie Elm, PA-C  predniSONE (DELTASONE) 20 MG tablet Take 1 tablet (20 mg total) by mouth daily with breakfast. 09/14/22  Yes Rodriguez-Southworth, Nettie Elm, PA-C  amoxicillin-clavulanate (AUGMENTIN) 875-125 MG tablet Take 1 tablet by mouth 2 (two) times daily. 08/22/22   Junie Spencer, FNP  cyanocobalamin (,VITAMIN B-12,) 1000 MCG/ML injection 1000 mcg (1 mg) injection once per week for four weeks, followed by 1000 mcg injection once per month. 11/08/19   Theadore Nan, NP  ergocalciferol (VITAMIN D2) 1.25 MG (50000 UT) capsule Take 1 capsule (50,000 Units total) by mouth once a week. 09/21/21   McDonough, Salomon Fick, PA-C  hydrOXYzine (VISTARIL) 25  MG capsule Take 1 capsule (25 mg total) by mouth every 8 (eight) hours as needed. 05/27/22   McDonough, Salomon Fick, PA-C  ibuprofen (ADVIL) 800 MG tablet Take 1 tablet (800 mg total) by mouth every 8 (eight) hours as needed (pain). 08/18/21   Zenia Resides, MD  naproxen (NAPROSYN) 500 MG tablet Take 1 tablet (500 mg total) by mouth 2 (two) times daily with a meal. 03/10/22   Burnette, Alessandra Bevels, PA-C  trimethoprim-polymyxin b (POLYTRIM) ophthalmic solution Place 1 drop into the left eye every 4 (four) hours. X 5 days 05/21/22   Margaretann Loveless, PA-C  albuterol (PROVENTIL HFA;VENTOLIN HFA) 108 (90 Base) MCG/ACT inhaler Inhale 2 puffs into the lungs every 6 (six) hours as needed for wheezing or shortness of breath. Patient not taking: Reported on 07/10/2018 10/03/17 09/19/18  Nita Sickle,  MD    Family History Family History  Problem Relation Age of Onset   Cancer Maternal Grandmother        lung   Emphysema Maternal Grandmother    Depression Maternal Grandmother    Heart disease Maternal Grandmother    Hyperlipidemia Maternal Grandmother    Hypertension Maternal Grandmother    Stroke Maternal Grandmother    Cancer Maternal Grandfather        pancreatic   Hypertension Mother    Depression Mother    Hyperlipidemia Mother    Asthma Son    Cancer Paternal Grandmother    Cancer Paternal Grandfather    Heart disease Paternal Grandfather    Alcohol abuse Neg Hx    Arthritis Neg Hx    Birth defects Neg Hx    COPD Neg Hx    Diabetes Neg Hx    Drug abuse Neg Hx    Early death Neg Hx    Hearing loss Neg Hx    Kidney disease Neg Hx    Learning disabilities Neg Hx    Mental illness Neg Hx    Mental retardation Neg Hx    Miscarriages / Stillbirths Neg Hx    Vision loss Neg Hx    Varicose Veins Neg Hx     Social History Social History   Tobacco Use   Smoking status: Never   Smokeless tobacco: Never  Vaping Use   Vaping Use: Never used  Substance Use Topics   Alcohol use: No   Drug use: Never     Allergies   Sulfa antibiotics and Flagyl [metronidazole]   Review of Systems Review of Systems  Constitutional:  Negative for appetite change, chills, diaphoresis and fever.  HENT:  Positive for postnasal drip and sneezing. Negative for congestion, ear discharge, ear pain, rhinorrhea, sore throat and trouble swallowing.   Eyes:  Negative for discharge.  Respiratory:  Positive for cough and wheezing. Negative for chest tightness and shortness of breath.   Hematological:  Negative for adenopathy.     Physical Exam Triage Vital Signs Repeated pulse ox 98% BO 125/83  ED Triage Vitals  Enc Vitals Group     BP 09/14/22 1004 (!) 142/85     Pulse Rate 09/14/22 1004 98     Resp 09/14/22 1004 17     Temp 09/14/22 1004 98.6 F (37 C)     Temp src --       SpO2 09/14/22 1004 95 %     Weight --      Height --      Head Circumference --      Peak Flow --  Pain Score 09/14/22 1003 2     Pain Loc --      Pain Edu? --      Excl. in GC? --    No data found.  Updated Vital Signs BP (!) 142/85 (BP Location: Left Arm)   Pulse 98   Temp 98.6 F (37 C)   Resp 17   LMP 09/06/2022 (Exact Date)   SpO2 95%   Visual Acuity Right Eye Distance:   Left Eye Distance:   Bilateral Distance:    Right Eye Near:   Left Eye Near:    Bilateral Near:     Physical Exam Alert pt NAD who has had several episodes of coughing fits EYES- non icterus NOSE- has mild mucosa congestion which is pale pink with clear mucous. No sinus tenderness TM- both gray and little dull, canals are normal PHARYNX- clear, clear drainage noted.  NECK- supple with no nodes LUNGS- clear HEART - RRR with no murmurs SKIN- non jaundiced, no rashes.    UC Treatments / Results  Labs (all labs ordered are listed, but only abnormal results are displayed) Labs Reviewed - No data to display  EKG   Radiology No results found.  Procedures Procedures (including critical care time)  Medications Ordered in UC Medications - No data to display  Initial Impression / Assessment and Plan / UC Course  I have reviewed the triage vital signs and the nursing notes.  Allergic rhinitis Bronchitis Bronchospasm  I placed on her Zpack as noted  to cover for Mycoplasma. Allegra for allergies as noted and prednisone to help with bronchospasm.    Final Clinical Impressions(s) / UC Diagnoses   Final diagnoses:  Bronchitis     Discharge Instructions      Plese come back if the cough persists after done with the medication     ED Prescriptions     Medication Sig Dispense Auth. Provider   fexofenadine (ALLEGRA ALLERGY) 180 MG tablet Take 1 tablet (180 mg total) by mouth daily. 30 tablet Rodriguez-Southworth, Nettie Elm, PA-C   azithromycin (ZITHROMAX Z-PAK) 250 MG tablet  Take 2 today, and one every day x 4 days 6 tablet Rodriguez-Southworth, Lilliane Sposito, PA-C   predniSONE (DELTASONE) 20 MG tablet Take 1 tablet (20 mg total) by mouth daily with breakfast. 5 tablet Rodriguez-Southworth, Nettie Elm, PA-C      PDMP not reviewed this encounter.   Garey Ham, PA-C 09/14/22 1025

## 2022-10-08 ENCOUNTER — Ambulatory Visit (HOSPITAL_COMMUNITY)
Admission: EM | Admit: 2022-10-08 | Discharge: 2022-10-08 | Disposition: A | Discharge status: Home / Self Care | Payer: Medicaid Other | Attending: Emergency Medicine | Admitting: Emergency Medicine

## 2022-10-08 ENCOUNTER — Encounter (HOSPITAL_COMMUNITY): Payer: Self-pay

## 2022-10-08 DIAGNOSIS — M791 Myalgia, unspecified site: Secondary | ICD-10-CM | POA: Diagnosis not present

## 2022-10-08 LAB — POCT URINALYSIS DIP (MANUAL ENTRY)
Bilirubin, UA: NEGATIVE
Blood, UA: NEGATIVE
Glucose, UA: NEGATIVE mg/dL
Ketones, POC UA: NEGATIVE mg/dL
Leukocytes, UA: NEGATIVE
Nitrite, UA: NEGATIVE
Protein Ur, POC: NEGATIVE mg/dL
Spec Grav, UA: 1.025 (ref 1.010–1.025)
Urobilinogen, UA: 0.2 E.U./dL
pH, UA: 5.5 (ref 5.0–8.0)

## 2022-10-08 MED ORDER — KETOROLAC TROMETHAMINE 30 MG/ML IJ SOLN
INTRAMUSCULAR | Status: AC
  Filled 2022-10-08: qty 1

## 2022-10-08 MED ORDER — IBUPROFEN 600 MG PO TABS: 600.0000 mg | ORAL_TABLET | Freq: Four times a day (QID) | ORAL | 0 refills | Status: DC | PRN

## 2022-10-08 MED ORDER — KETOROLAC TROMETHAMINE 30 MG/ML IJ SOLN
30.0000 mg | Freq: Once | INTRAMUSCULAR | Status: AC
  Administered 2022-10-08: 30 mg via INTRAMUSCULAR

## 2022-10-08 NOTE — Discharge Instructions (Addendum)
Starting tomorrow you can use ibuprofen every 6 hours for pain/inflammation. Please do not use any ibuprofen/Advil, naproxen/Aleve for the rest of today since we have given you the Toradol injection.  Apply hot pad to the area several times daily  Please go to the emergency department if symptoms worsen. Especially if you have severe pain, high fever, develop nausea or vomiting, etc

## 2022-10-08 NOTE — ED Provider Notes (Signed)
[facility]    CSN: [id number] Arrival date & time: 10/08/22  1138     History   Chief Complaint Chief Complaint  Patient presents with   Abdominal Pain    HPI Tammy Frost is a 42 y.o. female.  Here with right side abdominal pain that radiates to her back for the last 2 days. Rates pain 6/10, shooting pain, worse if she lays on that side/puts pressure Not having nausea, vomiting, diarrhea. Normal BM this morning, denies urinary symptoms  No fevers Denies rash or skin changes. History of chicken pox  Eating and drinking normally  History of gallstones, s/p cholecystectomy Appendectomy   Past Medical History:  Diagnosis Date   Abdominal gas pain 12/25/2019   Anxiety    Chicken pox    Chronic cholecystitis with calculus    Depression    Gallstones    UTI (urinary tract infection) 07-20-15    Patient Active Problem List   Diagnosis Date Noted   Closed fracture of tooth 05/02/2020   URI with cough and congestion 01/25/2020   Upper respiratory tract infection 01/25/2020   Body mass index (BMI) 36.0-36.9, adult 01/25/2020   Low serum vitamin B12 01/25/2020   Vitamin D deficiency 01/25/2020   Anxiety and depression 11/08/2019   Encounter for preventative adult health care examination 11/08/2019   Situational anxiety 11/01/2019   Obesity, morbid, BMI 40.0-49.9 (HCC) 11/01/2019    Past Surgical History:  Procedure Laterality Date   APPENDECTOMY     CESAREAN SECTION  12/27/2011   CESAREAN SECTION  12/31/2013   CHOLECYSTECTOMY N/A 09/02/2015   Procedure: LAPAROSCOPIC CHOLECYSTECTOMY WITH INTRAOPERATIVE CHOLANGIOGRAM;  Surgeon: Gladis Riffle, MD;  Location: [facility];  Service: General;  Laterality: N/A;   LAPAROSCOPIC APPENDECTOMY N/A 09/04/2017   Procedure: APPENDECTOMY LAPAROSCOPIC;  Surgeon: Leafy Ro, MD;  Location: [facility];  Service: General;  Laterality: N/A;    OB History     Gravida  1   Para      Term      Preterm      AB       Living         SAB      IAB      Ectopic      Multiple      Live Births               Home Medications    Prior to Admission medications   Medication Sig Start Date End Date Taking? Authorizing Provider  ibuprofen (ADVIL) 600 MG tablet Take 1 tablet (600 mg total) by mouth every 6 (six) hours as needed. 10/08/22  Yes Nyzir Dubois, Lurena Joiner, PA-C  cyanocobalamin (,VITAMIN B-12,) 1000 MCG/ML injection 1000 mcg (1 mg) injection once per week for four weeks, followed by 1000 mcg injection once per month. 11/08/19   Theadore Nan, NP  ergocalciferol (VITAMIN D2) 1.25 MG (50000 UT) capsule Take 1 capsule (50,000 Units total) by mouth once a week. 09/21/21   McDonough, Salomon Fick, PA-C  fexofenadine (ALLEGRA ALLERGY) 180 MG tablet Take 1 tablet (180 mg total) by mouth daily. 09/14/22   Rodriguez-Southworth, Nettie Elm, PA-C  albuterol (PROVENTIL HFA;VENTOLIN HFA) 108 (90 Base) MCG/ACT inhaler Inhale 2 puffs into the lungs every 6 (six) hours as needed for wheezing or shortness of breath. Patient not taking: Reported on 07/10/2018 10/03/17 09/19/18  Nita Sickle, MD    Family History Family History  Problem Relation Age of Onset   Cancer Maternal Grandmother  lung   Emphysema Maternal Grandmother    Depression Maternal Grandmother    Heart disease Maternal Grandmother    Hyperlipidemia Maternal Grandmother    Hypertension Maternal Grandmother    Stroke Maternal Grandmother    Cancer Maternal Grandfather        pancreatic   Hypertension Mother    Depression Mother    Hyperlipidemia Mother    Asthma Son    Cancer Paternal Grandmother    Cancer Paternal Grandfather    Heart disease Paternal Grandfather    Alcohol abuse Neg Hx    Arthritis Neg Hx    Birth defects Neg Hx    COPD Neg Hx    Diabetes Neg Hx    Drug abuse Neg Hx    Early death Neg Hx    Hearing loss Neg Hx    Kidney disease Neg Hx    Learning disabilities Neg Hx    Mental illness Neg Hx    Mental  retardation Neg Hx    Miscarriages / Stillbirths Neg Hx    Vision loss Neg Hx    Varicose Veins Neg Hx     Social History Social History   Tobacco Use   Smoking status: Never   Smokeless tobacco: Never  Vaping Use   Vaping status: Never Used  Substance Use Topics   Alcohol use: No   Drug use: Never     Allergies   Sulfa antibiotics and Flagyl [metronidazole]   Review of Systems Review of Systems As per HPI  Physical Exam Triage Vital Signs ED Triage Vitals  Encounter Vitals Group     BP 10/08/22 1410 130/85     Systolic BP Percentile --      Diastolic BP Percentile --      Pulse Rate 10/08/22 1410 85     Resp 10/08/22 1410 19     Temp 10/08/22 1410 98.5 F (36.9 C)     Temp src --      SpO2 10/08/22 1410 95 %     Weight --      Height --      Head Circumference --      Peak Flow --      Pain Score 10/08/22 1409 6     Pain Loc --      Pain Education --      Exclude from Growth Chart --    No data found.  Updated Vital Signs BP 130/85   Pulse 85   Temp 98.5 F (36.9 C)   Resp 19   LMP 10/01/2022 (Exact Date)   SpO2 95%    Physical Exam Vitals and nursing note reviewed.  Constitutional:      Appearance: Normal appearance.  HENT:     Mouth/Throat:     Mouth: Mucous membranes are moist.     Pharynx: Oropharynx is clear.  Eyes:     General: No scleral icterus.    Conjunctiva/sclera: Conjunctivae normal.  Cardiovascular:     Rate and Rhythm: Normal rate and regular rhythm.     Heart sounds: Normal heart sounds.  Pulmonary:     Effort: Pulmonary effort is normal. No respiratory distress.     Breath sounds: Normal breath sounds.  Abdominal:     General: Bowel sounds are normal.     Palpations: Abdomen is soft.     Tenderness: There is abdominal tenderness. There is no right CVA tenderness, left CVA tenderness, guarding or rebound.     Comments: Tenderness to palpation  wraps around right middle abdomen, side, back. No tenderness through rest  of abdomen   Musculoskeletal:        General: Normal range of motion.     Cervical back: Normal range of motion.  Skin:    General: Skin is warm and dry.          Comments: Pain area distributed in dermatome although no rash, lesions, vesicles, erythema, bruising, or any other skin changes noted.   Neurological:     Mental Status: She is alert and oriented to person, place, and time.     UC Treatments / Results  Labs (all labs ordered are listed, but only abnormal results are displayed) Labs Reviewed  POCT URINALYSIS DIP (MANUAL ENTRY)    EKG  Radiology No results found.  Procedures Procedures   Medications Ordered in UC Medications  ketorolac (TORADOL) 30 MG/ML injection 30 mg (30 mg Intramuscular Given 10/08/22 1506)    Initial Impression / Assessment and Plan / UC Course  I have reviewed the triage vital signs and the nursing notes.  Pertinent labs & imaging results that were available during my care of the patient were reviewed by me and considered in my medical decision making (see chart for details).  Afebrile and well appearing, no acute distress UA is unremarkable   She is tender on her right side into back, suspect this could be muscular given worse with movement and pressure Although with history of cholecystectomy consider post-surgical changes or gallstones in bile duct Discussion with patient about symptoms and etiologies. Discussed trial of pain medication in clinic and re-evaluating if emergency department workup would be necessary.  Patient would like to try toradol IM   On reassessment she is feeling much better. Discussed symptomatic care at home with anti-inflammatories, topical pain relievers and hot pad. Offered muscle relaxer but patient declined. Strict ED precautions for any worsening of symptoms. Patient verbalizes understanding, agrees to plan  Final Clinical Impressions(s) / UC Diagnoses   Final diagnoses:  Muscular pain      Discharge Instructions      Starting tomorrow you can use ibuprofen every 6 hours for pain/inflammation. Please do not use any ibuprofen/Advil, naproxen/Aleve for the rest of today since we have given you the Toradol injection.  Apply hot pad to the area several times daily  Please go to the emergency department if symptoms worsen. Especially if you have severe pain, high fever, develop nausea or vomiting, etc     ED Prescriptions     Medication Sig Dispense Auth. Provider   ibuprofen (ADVIL) 600 MG tablet Take 1 tablet (600 mg total) by mouth every 6 (six) hours as needed. 30 tablet Nikos Anglemyer, Lurena Joiner, PA-C      PDMP not reviewed this encounter.   Kathrine Haddock 10/08/22 4098

## 2022-10-08 NOTE — ED Triage Notes (Signed)
Pt presents with right sided abdominal paint hat goes into her back x 2 days. Denies any urinary symptoms. Reports the pain is like a stinging pain.

## 2022-10-10 ENCOUNTER — Emergency Department: Payer: Medicaid Other

## 2022-10-10 ENCOUNTER — Other Ambulatory Visit: Payer: Self-pay

## 2022-10-10 ENCOUNTER — Encounter: Payer: Self-pay | Admitting: Emergency Medicine

## 2022-10-10 ENCOUNTER — Emergency Department
Admission: EM | Admit: 2022-10-10 | Discharge: 2022-10-10 | Disposition: A | Discharge status: Home / Self Care | Payer: Medicaid Other | Source: Home / Self Care | Attending: Emergency Medicine | Admitting: Emergency Medicine

## 2022-10-10 DIAGNOSIS — K5900 Constipation, unspecified: Secondary | ICD-10-CM

## 2022-10-10 DIAGNOSIS — R1031 Right lower quadrant pain: Secondary | ICD-10-CM | POA: Diagnosis not present

## 2022-10-10 DIAGNOSIS — R109 Unspecified abdominal pain: Secondary | ICD-10-CM | POA: Diagnosis present

## 2022-10-10 DIAGNOSIS — K449 Diaphragmatic hernia without obstruction or gangrene: Secondary | ICD-10-CM | POA: Diagnosis not present

## 2022-10-10 LAB — COMPREHENSIVE METABOLIC PANEL
ALT: 16 U/L (ref 0–44)
AST: 15 U/L (ref 15–41)
Albumin: 3.8 g/dL (ref 3.5–5.0)
Alkaline Phosphatase: 45 U/L (ref 38–126)
Anion gap: 9 (ref 5–15)
BUN: 7 mg/dL (ref 6–20)
CO2: 24 mmol/L (ref 22–32)
Calcium: 9 mg/dL (ref 8.9–10.3)
Chloride: 105 mmol/L (ref 98–111)
Creatinine, Ser: 0.88 mg/dL (ref 0.44–1.00)
GFR, Estimated: >60 mL/min (ref >60)
Glucose, Bld: 112 mg/dL — ABNORMAL HIGH (ref 70–99)
Potassium: 3.7 mmol/L (ref 3.5–5.1)
Sodium: 138 mmol/L (ref 135–145)
Total Bilirubin: 0.8 mg/dL (ref 0.3–1.2)
Total Protein: 7.5 g/dL (ref 6.5–8.1)

## 2022-10-10 LAB — URINALYSIS, ROUTINE W REFLEX MICROSCOPIC
Bacteria, UA: NONE SEEN
Bilirubin Urine: NEGATIVE
Glucose, UA: NEGATIVE mg/dL
Ketones, ur: NEGATIVE mg/dL
Leukocytes,Ua: NEGATIVE
Nitrite: NEGATIVE
Protein, ur: NEGATIVE mg/dL
Specific Gravity, Urine: 1.009 (ref 1.005–1.030)
WBC, UA: NONE SEEN WBC/hpf (ref 0–5)
pH: 6 (ref 5.0–8.0)

## 2022-10-10 LAB — CBC
HCT: 37 % (ref 36.0–46.0)
Hemoglobin: 11.7 g/dL — ABNORMAL LOW (ref 12.0–15.0)
MCH: 26.4 pg (ref 26.0–34.0)
MCHC: 31.6 g/dL (ref 30.0–36.0)
MCV: 83.5 fL (ref 80.0–100.0)
Platelets: 237 10*3/uL (ref 150–400)
RBC: 4.43 MIL/uL (ref 3.87–5.11)
RDW: 12.8 % (ref 11.5–15.5)
WBC: 5.6 10*3/uL (ref 4.0–10.5)
nRBC: 0 % (ref 0.0–0.2)

## 2022-10-10 LAB — LIPASE, BLOOD: Lipase: 30 U/L (ref 11–51)

## 2022-10-10 LAB — PREGNANCY, URINE: Preg Test, Ur: NEGATIVE

## 2022-10-10 MED ORDER — IOHEXOL 300 MG/ML  SOLN
100.0000 mL | Freq: Once | INTRAMUSCULAR | Status: AC | PRN
  Administered 2022-10-10: 100 mL via INTRAVENOUS

## 2022-10-10 MED ORDER — KETOROLAC TROMETHAMINE 15 MG/ML IJ SOLN
15.0000 mg | Freq: Once | INTRAMUSCULAR | Status: AC
  Administered 2022-10-10: 15 mg via INTRAVENOUS
  Filled 2022-10-10: qty 1

## 2022-10-10 NOTE — ED Triage Notes (Signed)
Pt to ED via POV for RLQ abdominal pain. Pt states that she went to Urgent care on 7/26. Pt states that they gave her a shot of pain medication and told her to come to the ED today if she was not better. Pt states that symptoms have been going on for about 4-5 days. Pt denies hx/o Kidney stones. Pt is in NAD.

## 2022-10-10 NOTE — ED Provider Notes (Signed)
[facility] Provider Note    Event Date/Time   First MD Initiated Contact with Patient 10/10/22 1514     (approximate)   History   Abdominal Pain   HPI  Tammy Frost is a 42 y.o. female past medical history significant for prior cholecystectomy and appendectomy who presents to the emergency department with right-sided abdominal pain.  Denies any nausea, vomiting, diarrhea.  Denies any constipation.  No dysuria.  Evaluated by urgent care and told to come to the emergency department if she had ongoing pain.     Physical Exam   Triage Vital Signs: ED Triage Vitals  Encounter Vitals Group     BP 10/10/22 1432 126/80     Systolic BP Percentile --      Diastolic BP Percentile --      Pulse Rate 10/10/22 1432 88     Resp 10/10/22 1432 16     Temp 10/10/22 1432 98.5 F (36.9 C)     Temp Source 10/10/22 1432 Oral     SpO2 10/10/22 1432 100 %     Weight 10/10/22 1421 193 lb (87.5 kg)     Height 10/10/22 1421 5\' 1"  (1.549 m)     Head Circumference --      Peak Flow --      Pain Score 10/10/22 1421 4     Pain Loc --      Pain Education --      Exclude from Growth Chart --     Most recent vital signs: Vitals:   10/10/22 1432  BP: 126/80  Pulse: 88  Resp: 16  Temp: 98.5 F (36.9 C)  SpO2: 100%    Physical Exam Constitutional:      Appearance: She is well-developed.  HENT:     Head: Atraumatic.  Eyes:     Conjunctiva/sclera: Conjunctivae normal.  Cardiovascular:     Rate and Rhythm: Regular rhythm.  Pulmonary:     Effort: No respiratory distress.  Abdominal:     General: There is no distension.     Tenderness: There is abdominal tenderness in the right lower quadrant. There is no right CVA tenderness, left CVA tenderness or guarding. Negative signs include Murphy's sign and McBurney's sign.  Musculoskeletal:        General: Normal range of motion.     Cervical back: Normal range of motion.  Skin:    General: Skin is warm.   Neurological:     Mental Status: She is alert. Mental status is at baseline.     IMPRESSION / MDM / ASSESSMENT AND PLAN / ED COURSE  I reviewed the triage vital signs and the nursing notes.  Differential diagnosis including intra-abdominal abscess, kidney stone, pyelonephritis, constipation, bowel obstruction  RADIOLOGY my interpretation of imaging: CT with no signs of bowel obstruction.  Read as no signs of bowel obstruction.  Signs of possible constipation with moderate amount of stool.  LABS (all labs ordered are listed, but only abnormal results are displayed) Labs interpreted as -    Labs Reviewed  COMPREHENSIVE METABOLIC PANEL - Abnormal; Notable for the following components:      Result Value   Glucose, Bld 112 (*)    All other components within normal limits  CBC - Abnormal; Notable for the following components:   Hemoglobin 11.7 (*)    All other components within normal limits  URINALYSIS, ROUTINE W REFLEX MICROSCOPIC - Abnormal; Notable for the following components:   Color, Urine YELLOW (*)  APPearance HAZY (*)    Hgb urine dipstick SMALL (*)    All other components within normal limits  LIPASE, BLOOD  PREGNANCY, URINE     MDM  Lab work reassuring.  No significant leukocytosis.  Creatinine at baseline with no significant electrolyte abnormalities.  No signs of urinary tract infection.  No signs of pancreatitis.  Clinic picture is not consistent with kidney stone.  No signs of bowel obstruction.  Discussed Motrin and Tylenol for pain control.  Discussed MiraLAX for constipation.  Discussed ongoing workup with primary care physician and given return precautions for any worsening symptoms.     PROCEDURES:  Critical Care performed: No  Procedures  Patient's presentation is most consistent with acute illness / injury with system symptoms.   MEDICATIONS ORDERED IN ED: Medications  ketorolac (TORADOL) 15 MG/ML injection 15 mg (15 mg Intravenous Given  10/10/22 1623)  iohexol (OMNIPAQUE) 300 MG/ML solution 100 mL (100 mLs Intravenous Contrast Given 10/10/22 1639)    FINAL CLINICAL IMPRESSION(S) / ED DIAGNOSES   Final diagnoses:  Right lower quadrant abdominal pain  Constipation, unspecified constipation type     Rx / DC Orders   ED Discharge Orders     None        Note:  This document was prepared using Dragon voice recognition software and may include unintentional dictation errors.   Corena Herter, MD 10/10/22 2050

## 2022-10-10 NOTE — Discharge Instructions (Signed)
You are seen in the emergency department for right-sided abdominal pain.  You had a CT scan that did not show any findings to explain your abdominal pain.  You had no signs of kidney stones.  Your lab work was normal.  You did not have any findings of urinary tract infection.  You did have findings of constipation.  Constipation management - take MiraLAX (4 capfulls) mixed with 32-64 ounces of fluid.  Drink this entire drink.  If you do not have a bowel movement that day you can repeat the next day and add 2 capfuls for a total of 6 capfuls of MiraLAX.  MiraLAX does not work if you do not drink plenty of fluids with it. Once you have a good bowel movement, take 1 capfull of Miralax daily until having regular bowel movements.

## 2022-10-21 ENCOUNTER — Telehealth: Payer: Medicaid Other | Admitting: Physician Assistant

## 2022-10-21 DIAGNOSIS — K047 Periapical abscess without sinus: Secondary | ICD-10-CM | POA: Diagnosis not present

## 2022-10-21 MED ORDER — AMOXICILLIN-POT CLAVULANATE 875-125 MG PO TABS
1.0000 | ORAL_TABLET | Freq: Two times a day (BID) | ORAL | 0 refills | Status: DC
Start: 2022-10-21 — End: 2022-12-02

## 2022-10-21 NOTE — Progress Notes (Signed)
I have spent 5 minutes in review of e-visit questionnaire, review and updating patient chart, medical decision making and response to patient.   Jaideep Pollack Cody Lakeva Hollon, PA-C    

## 2022-10-21 NOTE — Progress Notes (Signed)

## 2022-11-01 DIAGNOSIS — E538 Deficiency of other specified B group vitamins: Secondary | ICD-10-CM | POA: Diagnosis not present

## 2022-11-01 DIAGNOSIS — R7989 Other specified abnormal findings of blood chemistry: Secondary | ICD-10-CM | POA: Diagnosis not present

## 2022-11-01 DIAGNOSIS — R5383 Other fatigue: Secondary | ICD-10-CM | POA: Diagnosis not present

## 2022-11-01 DIAGNOSIS — E559 Vitamin D deficiency, unspecified: Secondary | ICD-10-CM | POA: Diagnosis not present

## 2022-11-01 DIAGNOSIS — E782 Mixed hyperlipidemia: Secondary | ICD-10-CM | POA: Diagnosis not present

## 2022-11-02 LAB — LIPID PANEL WITH LDL/HDL RATIO
LDL/HDL Ratio: 3.4 ratio — ABNORMAL HIGH (ref 0.0–3.2)
VLDL Cholesterol Cal: 22 mg/dL (ref 5–40)

## 2022-11-02 LAB — COMPREHENSIVE METABOLIC PANEL
Bilirubin Total: 0.4 mg/dL (ref 0.0–1.2)
Globulin, Total: 2.9 g/dL (ref 1.5–4.5)

## 2022-11-02 LAB — CBC WITH DIFFERENTIAL/PLATELET
Lymphocytes Absolute: 2.3 10*3/uL (ref 0.7–3.1)
Lymphs: 33 %
Monocytes Absolute: 0.4 10*3/uL (ref 0.1–0.9)
Neutrophils Absolute: 3.9 10*3/uL (ref 1.4–7.0)
RDW: 13.6 % (ref 11.7–15.4)

## 2022-11-05 ENCOUNTER — Other Ambulatory Visit: Payer: Self-pay

## 2022-11-05 ENCOUNTER — Telehealth: Payer: Self-pay

## 2022-11-05 MED ORDER — ERGOCALCIFEROL 1.25 MG (50000 UT) PO CAPS: 50000.0000 [IU] | ORAL_CAPSULE | ORAL | 1 refills | Status: DC

## 2022-11-05 MED ORDER — FOLIC ACID 1 MG PO TABS: 1.0000 mg | ORAL_TABLET | Freq: Every day | ORAL | 5 refills | Status: DC

## 2022-11-05 NOTE — Telephone Encounter (Signed)
-----   Message from Carlean Jews sent at 11/05/2022 12:58 PM EDT ----- Please let her know that her Vit D and folate are both low and needs to be supplemented (restart drisdol add folic acid). B12 also low again as well--shots or increase oral supplement if already taking still). Cholesterol is up and needs to work on diet and exercise and will monitor.

## 2022-11-05 NOTE — Telephone Encounter (Signed)
Pt advised for her labs and sent med for drisdol and folic acid  and gave toni for B12 injection

## 2022-11-08 ENCOUNTER — Ambulatory Visit (INDEPENDENT_AMBULATORY_CARE_PROVIDER_SITE_OTHER): Payer: Medicaid Other

## 2022-11-08 DIAGNOSIS — E538 Deficiency of other specified B group vitamins: Secondary | ICD-10-CM

## 2022-11-08 MED ORDER — CYANOCOBALAMIN 1000 MCG/ML IJ SOLN
1000.0000 ug | Freq: Once | INTRAMUSCULAR | Status: AC
Start: 2022-11-08 — End: 2022-11-08
  Administered 2022-11-08: 1000 ug via INTRAMUSCULAR

## 2022-11-16 ENCOUNTER — Ambulatory Visit: Payer: Medicaid Other

## 2022-11-22 ENCOUNTER — Ambulatory Visit: Payer: Medicaid Other

## 2022-11-22 ENCOUNTER — Telehealth: Payer: Self-pay | Admitting: Physician Assistant

## 2022-11-22 NOTE — Telephone Encounter (Signed)
Vm not set up to lvm regarding appt 11/22/22

## 2022-11-24 ENCOUNTER — Telehealth: Payer: Self-pay

## 2022-11-24 NOTE — Telephone Encounter (Signed)
..   Medicaid Managed Care   Unsuccessful Outreach Note  11/24/2022 Name: Baila EBONY GITTENS MRN: [id number] DOB: [birth year]  Referred by: Carlean Jews, PA-C Reason for referral : No chief complaint on file.   An unsuccessful telephone outreach was attempted today. The patient was referred to the case management team for assistance with care management and care coordination.   Follow Up Plan: If patient returns call to provider office, please advise to call Embedded Care Management Care Guide Nicholes Rough* at [phone] Rough, CMA

## 2022-12-02 ENCOUNTER — Ambulatory Visit (INDEPENDENT_AMBULATORY_CARE_PROVIDER_SITE_OTHER): Payer: Medicaid Other | Admitting: Physician Assistant

## 2022-12-02 ENCOUNTER — Encounter: Payer: Self-pay | Admitting: Physician Assistant

## 2022-12-02 VITALS — BP 110/80 | HR 85 | Temp 98.3°F | Resp 16 | Ht 62.0 in | Wt 215.0 lb

## 2022-12-02 DIAGNOSIS — E538 Deficiency of other specified B group vitamins: Secondary | ICD-10-CM

## 2022-12-02 DIAGNOSIS — E559 Vitamin D deficiency, unspecified: Secondary | ICD-10-CM

## 2022-12-02 DIAGNOSIS — Z01419 Encounter for gynecological examination (general) (routine) without abnormal findings: Secondary | ICD-10-CM | POA: Diagnosis not present

## 2022-12-02 DIAGNOSIS — E782 Mixed hyperlipidemia: Secondary | ICD-10-CM | POA: Diagnosis not present

## 2022-12-02 DIAGNOSIS — Z0001 Encounter for general adult medical examination with abnormal findings: Secondary | ICD-10-CM | POA: Diagnosis not present

## 2022-12-02 MED ORDER — CYANOCOBALAMIN 1000 MCG/ML IJ SOLN
1000.0000 ug | Freq: Once | INTRAMUSCULAR | Status: AC
Start: 2022-12-02 — End: 2022-12-02
  Administered 2022-12-02: 1000 ug via INTRAMUSCULAR

## 2022-12-02 NOTE — Progress Notes (Signed)
[facility] [address] [place], [place] [address]  Internal MEDICINE  Office Visit Note  Patient Name: Tammy Frost  [id number]  [id number]  Date of Service: 12/02/2022  Chief Complaint  Patient presents with   Annual Exam   Depression   Anxiety     HPI Pt is here for routine health maintenance examination -Recently had covid but is doing better now. States her kids had it from school. 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 She will request vaccination records and will come back for TB testing next week.  Current Medication: Outpatient Encounter Medications as of 12/02/2022  Medication Sig   cyanocobalamin (,VITAMIN B-12,) 1000 MCG/ML injection 1000 mcg (1 mg) injection once per week for four weeks, followed by 1000 mcg injection once per month.   ergocalciferol (VITAMIN D2) 1.25 MG (50000 UT) capsule Take 1 capsule (50,000 Units total) by mouth once a week.   fexofenadine (ALLEGRA ALLERGY) 180 MG tablet Take 1 tablet (180 mg total) by mouth daily.   folic acid (FOLVITE) 1 MG tablet Take 1 tablet (1 mg total) by mouth daily.   ibuprofen (ADVIL) 600 MG tablet Take 1 tablet (600 mg total) by mouth every 6 (six) hours as needed.   [DISCONTINUED] amoxicillin-clavulanate (AUGMENTIN) 875-125 MG tablet Take 1 tablet by mouth 2 (two) times daily.   [DISCONTINUED] albuterol (PROVENTIL HFA;VENTOLIN HFA) 108 (90 Base) MCG/ACT inhaler Inhale 2 puffs into the lungs every 6 (six) hours as needed for wheezing or shortness of breath. (Patient not taking: Reported on 07/10/2018)   [EXPIRED] cyanocobalamin (VITAMIN B12) injection 1,000 mcg    No facility-administered encounter medications on file  as of 12/02/2022.    Surgical History: Past Surgical History:  Procedure Laterality Date   APPENDECTOMY     CESAREAN SECTION  12/27/2011   CESAREAN SECTION  12/31/2013   CHOLECYSTECTOMY N/A 09/02/2015   Procedure: LAPAROSCOPIC CHOLECYSTECTOMY WITH INTRAOPERATIVE CHOLANGIOGRAM;  Surgeon: Gladis Riffle, MD;  Location: [facility];  Service: General;  Laterality: N/A;   LAPAROSCOPIC APPENDECTOMY N/A 09/04/2017   Procedure: APPENDECTOMY LAPAROSCOPIC;  Surgeon: Leafy Ro, MD;  Location: [facility];  Service: General;  Laterality: N/A;    Medical History: Past Medical History:  Diagnosis Date   Abdominal gas pain 12/25/2019   Anxiety    Chicken pox    Chronic cholecystitis with calculus    Depression    Gallstones    UTI (urinary tract infection) 07-20-15    Family History: Family History  Problem Relation Age of Onset   Cancer Maternal Grandmother        lung   Emphysema Maternal Grandmother    Depression Maternal Grandmother    Heart disease Maternal Grandmother    Hyperlipidemia Maternal Grandmother    Hypertension Maternal Grandmother    Stroke Maternal Grandmother    Cancer Maternal Grandfather        pancreatic   Hypertension Mother    Depression Mother    Hyperlipidemia Mother    Asthma Son    Cancer Paternal Grandmother    Cancer Paternal Grandfather    Heart disease Paternal Grandfather    Alcohol abuse Neg Hx  Arthritis Neg Hx    Birth defects Neg Hx    COPD Neg Hx    Diabetes Neg Hx    Drug abuse Neg Hx    Early death Neg Hx    Hearing loss Neg Hx    Kidney disease Neg Hx    Learning disabilities Neg Hx    Mental illness Neg Hx    Mental retardation Neg Hx    Miscarriages / Stillbirths Neg Hx    Vision loss Neg Hx    Varicose Veins Neg Hx       Review of Systems  Constitutional:  Negative for chills, fatigue and unexpected weight change.  HENT:  Negative for congestion, postnasal drip, rhinorrhea, sneezing and sore throat.   Eyes:   Negative for redness.  Respiratory:  Negative for cough, chest tightness and shortness of breath.   Cardiovascular:  Negative for chest pain and palpitations.  Gastrointestinal:  Negative for abdominal pain, constipation, diarrhea, nausea and vomiting.  Genitourinary:  Negative for dysuria and frequency.  Musculoskeletal:  Negative for arthralgias, back pain, joint swelling and neck pain.  Skin:  Negative for rash.  Neurological: Negative.  Negative for tremors and numbness.  Hematological:  Negative for adenopathy. Does not bruise/bleed easily.  Psychiatric/Behavioral:  Negative for behavioral problems (Depression), sleep disturbance and suicidal ideas.      Vital Signs: BP 110/80   Pulse 85   Temp 98.3 F (36.8 C)   Resp 16   Ht 5\' 2"  (1.575 m)   Wt 215 lb (97.5 kg)   SpO2 99%   BMI 39.32 kg/m    Physical Exam Vitals and nursing note reviewed.  Constitutional:      General: She is not in acute distress.    Appearance: Normal appearance. She is well-developed. She is obese. She is not diaphoretic.  HENT:     Head: Normocephalic and atraumatic.     Right Ear: Tympanic membrane normal.     Left Ear: Tympanic membrane normal.     Mouth/Throat:     Pharynx: No oropharyngeal exudate.  Eyes:     Pupils: Pupils are equal, round, and reactive to light.  Neck:     Thyroid: No thyromegaly.     Vascular: No JVD.     Trachea: No tracheal deviation.  Cardiovascular:     Rate and Rhythm: Normal rate and regular rhythm.     Heart sounds: Normal heart sounds. No murmur heard.    No friction rub. No gallop.  Pulmonary:     Effort: Pulmonary effort is normal. No respiratory distress.     Breath sounds: No wheezing or rales.  Chest:     Chest wall: No tenderness.  Breasts:    Right: Normal. No mass.     Left: Normal. No mass.  Abdominal:     General: Bowel sounds are normal.     Palpations: Abdomen is soft.     Tenderness: There is no abdominal tenderness.  Musculoskeletal:         General: Normal range of motion.     Cervical back: Normal range of motion and neck supple.  Lymphadenopathy:     Cervical: No cervical adenopathy.  Skin:    General: Skin is warm and dry.  Neurological:     Mental Status: She is alert and oriented to person, place, and time.     Cranial Nerves: No cranial nerve deficit.  Psychiatric:        Behavior: Behavior normal.  Thought Content: Thought content normal.        Judgment: Judgment normal.      LABS: Recent Results (from the past 2160 hour(s))  POC urinalysis dipstick     Status: None   Collection Time: 10/08/22  2:52 PM  Result Value Ref Range   Color, UA yellow yellow   Clarity, UA clear clear   Glucose, UA negative negative mg/dL   Bilirubin, UA negative negative   Ketones, POC UA negative negative mg/dL   Spec Grav, UA 0.981 1.914 - 1.025   Blood, UA negative negative   pH, UA 5.5 5.0 - 8.0   Protein Ur, POC negative negative mg/dL   Urobilinogen, UA 0.2 0.2 or 1.0 E.U./dL   Nitrite, UA Negative Negative   Leukocytes, UA Negative Negative  Lipase, blood     Status: None   Collection Time: 10/10/22  2:34 PM  Result Value Ref Range   Lipase 30 11 - 51 U/L    Comment: Performed at [facility], [address] [place], [place] [address]  Comprehensive metabolic panel     Status: Abnormal   Collection Time: 10/10/22  2:34 PM  Result Value Ref Range   Sodium 138 135 - 145 mmol/L   Potassium 3.7 3.5 - 5.1 mmol/L   Chloride 105 98 - 111 mmol/L   CO2 24 22 - 32 mmol/L   Glucose, Bld 112 (H) 70 - 99 mg/dL    Comment: Glucose reference range applies only to samples taken after fasting for at least 8 hours.   BUN 7 6 - 20 mg/dL   Creatinine, Ser 6.21 0.44 - 1.00 mg/dL   Calcium 9.0 8.9 - 30.8 mg/dL   Total Protein 7.5 6.5 - 8.1 g/dL   Albumin 3.8 3.5 - 5.0 g/dL   AST 15 15 - 41 U/L   ALT 16 0 - 44 U/L   Alkaline Phosphatase 45 38 - 126 U/L   Total Bilirubin 0.8 0.3 - 1.2 mg/dL   GFR,  Estimated >65 >78 mL/min    Comment: (NOTE) Calculated using the CKD-EPI Creatinine Equation (2021)    Anion gap 9 5 - 15    Comment: Performed at [facility], [address] [place], [place] [address]  CBC     Status: Abnormal   Collection Time: 10/10/22  2:34 PM  Result Value Ref Range   WBC 5.6 4.0 - 10.5 K/uL   RBC 4.43 3.87 - 5.11 MIL/uL   Hemoglobin 11.7 (L) 12.0 - 15.0 g/dL   HCT 95.2 84.1 - 32.4 %   MCV 83.5 80.0 - 100.0 fL   MCH 26.4 26.0 - 34.0 pg   MCHC 31.6 30.0 - 36.0 g/dL   RDW 40.1 02.7 - 25.3 %   Platelets 237 150 - 400 K/uL   nRBC 0.0 0.0 - 0.2 %    Comment: Performed at [facility], [address] [place], [place] [address]  Urinalysis, Routine w reflex microscopic -Urine, Clean Catch     Status: Abnormal   Collection Time: 10/10/22  2:34 PM  Result Value Ref Range   Color, Urine YELLOW (A) YELLOW   APPearance HAZY (A) CLEAR   Specific Gravity, Urine 1.009 1.005 - 1.030   pH 6.0 5.0 - 8.0   Glucose, UA NEGATIVE NEGATIVE mg/dL   Hgb urine dipstick SMALL (A) NEGATIVE   Bilirubin Urine NEGATIVE NEGATIVE   Ketones, ur NEGATIVE NEGATIVE mg/dL   Protein, ur NEGATIVE NEGATIVE mg/dL   Nitrite  NEGATIVE NEGATIVE   Leukocytes,Ua NEGATIVE NEGATIVE   RBC / HPF 0-5 0 - 5 RBC/hpf   WBC, UA NONE SEEN 0 - 5 WBC/hpf   Bacteria, UA NONE SEEN NONE SEEN   Squamous Epithelial / HPF 11-20 0 - 5 /HPF   Mucus PRESENT     Comment: Performed at [facility], [address] [place], [place] [address]  Pregnancy, urine     Status: None   Collection Time: 10/10/22  2:34 PM  Result Value Ref Range   Preg Test, Ur NEGATIVE NEGATIVE    Comment:        THE SENSITIVITY OF THIS METHODOLOGY IS >25 mIU/mL. Performed at [facility], [address] [place], [place] [address]   B12 and Folate Panel     Status: Abnormal   Collection Time: 11/01/22  2:13 PM  Result Value Ref Range   Vitamin B-12 235 232 - 1,245 pg/mL   Folate 2.6 (L) >3.0  ng/mL    Comment: A serum folate concentration of less than 3.1 ng/mL is considered to represent clinical deficiency.   VITAMIN D 25 Hydroxy (Vit-D Deficiency, Fractures)     Status: Abnormal   Collection Time: 11/01/22  2:13 PM  Result Value Ref Range   Vit D, 25-Hydroxy 14.1 (L) 30.0 - 100.0 ng/mL    Comment: Vitamin D deficiency has been defined by the Institute of Medicine and an Endocrine Society practice guideline as a level of serum 25-OH vitamin D less than 20 ng/mL (1,2). The Endocrine Society went on to further define vitamin D insufficiency as a level between 21 and 29 ng/mL (2). 1. IOM (Institute of Medicine). 2010. Dietary reference    intakes for calcium and D. [place]: The    [employer]. 2. Holick MF, Binkley Montegut, Bischoff-Ferrari HA, et al.    Evaluation, treatment, and prevention of vitamin D    deficiency: an Endocrine Society clinical practice    guideline. JCEM. 2011 Jul; 96(7):1911-30.   Comprehensive metabolic panel     Status: Abnormal   Collection Time: 11/01/22  2:13 PM  Result Value Ref Range   Glucose 69 (L) 70 - 99 mg/dL   BUN 7 6 - 24 mg/dL   Creatinine, Ser 4.01 0.57 - 1.00 mg/dL   eGFR 80 >02 VO/ZDG/6.44   BUN/Creatinine Ratio 8 (L) 9 - 23   Sodium 139 134 - 144 mmol/L   Potassium 3.9 3.5 - 5.2 mmol/L   Chloride 101 96 - 106 mmol/L   CO2 22 20 - 29 mmol/L   Calcium 9.1 8.7 - 10.2 mg/dL   Total Protein 7.2 6.0 - 8.5 g/dL   Albumin 4.3 3.9 - 4.9 g/dL   Globulin, Total 2.9 1.5 - 4.5 g/dL   Bilirubin Total 0.4 0.0 - 1.2 mg/dL   Alkaline Phosphatase 52 44 - 121 IU/L   AST 11 0 - 40 IU/L   ALT 11 0 - 32 IU/L  CBC w/Diff/Platelet     Status: Abnormal   Collection Time: 11/01/22  2:13 PM  Result Value Ref Range   WBC 6.9 3.4 - 10.8 x10E3/uL   RBC 4.30 3.77 - 5.28 x10E6/uL   Hemoglobin 11.4 11.1 - 15.9 g/dL   Hematocrit 03.4 74.2 - 46.6 %   MCV 82 79 - 97 fL   MCH 26.5 (L) 26.6 - 33.0 pg   MCHC 32.5 31.5 - 35.7 g/dL   RDW  59.5 63.8 - 75.6 %   Platelets 264 150 -  450 x10E3/uL   Neutrophils 56 Not Estab. %   Lymphs 33 Not Estab. %   Monocytes 6 Not Estab. %   Eos 4 Not Estab. %   Basos 1 Not Estab. %   Neutrophils Absolute 3.9 1.4 - 7.0 x10E3/uL   Lymphocytes Absolute 2.3 0.7 - 3.1 x10E3/uL   Monocytes Absolute 0.4 0.1 - 0.9 x10E3/uL   EOS (ABSOLUTE) 0.2 0.0 - 0.4 x10E3/uL   Basophils Absolute 0.0 0.0 - 0.2 x10E3/uL   Immature Granulocytes 0 Not Estab. %   Immature Grans (Abs) 0.0 0.0 - 0.1 x10E3/uL  TSH + free T4     Status: None   Collection Time: 11/01/22  2:13 PM  Result Value Ref Range   TSH 2.530 0.450 - 4.500 uIU/mL   Free T4 1.08 0.82 - 1.77 ng/dL  Lipid Panel With LDL/HDL Ratio     Status: Abnormal   Collection Time: 11/01/22  2:13 PM  Result Value Ref Range   Cholesterol, Total 191 100 - 199 mg/dL   Triglycerides 350 0 - 149 mg/dL   HDL 38 (L) >09 mg/dL   VLDL Cholesterol Cal 22 5 - 40 mg/dL   LDL Chol Calc (NIH) 381 (H) 0 - 99 mg/dL   LDL/HDL Ratio 3.4 (H) 0.0 - 3.2 ratio    Comment:                                     LDL/HDL Ratio                                             Men  Women                               1/2 Avg.Risk  1.0    1.5                                   Avg.Risk  3.6    3.2                                2X Avg.Risk  6.2    5.0                                3X Avg.Risk  8.0    6.1         Assessment/Plan: 1. Encounter for general adult medical examination with abnormal findings CPE performed, labs previously reviewed, due for pap and mammogram per chart--will be seeing GYN soon  2. Mixed hyperlipidemia Will work on diet and exercise  3. Vitamin D deficiency Continue drisdol  4. B12 deficiency Continue B12  5. Visit for gynecologic examination Breast exam performed   General Counseling: Shruti verbalizes understanding of the findings of todays visit and agrees with plan of treatment. I have discussed any further diagnostic evaluation that may be  needed or ordered today. We also reviewed her medications today. she has been encouraged to call the office with any questions or concerns that should arise related to todays visit.    Counseling:  No orders of the defined types were placed in this encounter.   Meds ordered this encounter  Medications   cyanocobalamin (VITAMIN B12) injection 1,000 mcg    This patient was seen by Lynn Ito, PA-C in collaboration with Dr. Beverely Risen as a part of collaborative care agreement.  Total time spent:35 Minutes  Time spent includes review of chart, medications, test results, and follow up plan with the patient.     Lyndon Code, MD  Internal Medicine

## 2022-12-20 ENCOUNTER — Ambulatory Visit: Payer: Medicaid Other

## 2023-01-17 ENCOUNTER — Ambulatory Visit: Payer: Medicaid Other

## 2023-02-14 ENCOUNTER — Ambulatory Visit: Payer: Medicaid Other

## 2023-03-11 ENCOUNTER — Telehealth: Payer: Medicaid Other | Admitting: Physician Assistant

## 2023-03-11 DIAGNOSIS — K047 Periapical abscess without sinus: Secondary | ICD-10-CM | POA: Diagnosis not present

## 2023-03-11 MED ORDER — AMOXICILLIN-POT CLAVULANATE 875-125 MG PO TABS
1.0000 | ORAL_TABLET | Freq: Two times a day (BID) | ORAL | 0 refills | Status: DC
Start: 2023-03-11 — End: 2023-08-09

## 2023-03-11 NOTE — Progress Notes (Signed)

## 2023-03-17 ENCOUNTER — Encounter: Payer: Self-pay | Admitting: Physician Assistant

## 2023-03-21 ENCOUNTER — Ambulatory Visit: Payer: Medicaid Other

## 2023-04-18 ENCOUNTER — Ambulatory Visit: Payer: Medicaid Other

## 2023-05-30 ENCOUNTER — Ambulatory Visit: Payer: Medicaid Other | Admitting: Physician Assistant

## 2023-08-09 ENCOUNTER — Ambulatory Visit (HOSPITAL_COMMUNITY)
Admission: EM | Admit: 2023-08-09 | Discharge: 2023-08-09 | Disposition: A | Discharge status: Home / Self Care | Attending: Emergency Medicine | Admitting: Emergency Medicine

## 2023-08-09 ENCOUNTER — Encounter (HOSPITAL_COMMUNITY): Payer: Self-pay

## 2023-08-09 DIAGNOSIS — B9689 Other specified bacterial agents as the cause of diseases classified elsewhere: Secondary | ICD-10-CM

## 2023-08-09 DIAGNOSIS — J208 Acute bronchitis due to other specified organisms: Secondary | ICD-10-CM | POA: Diagnosis not present

## 2023-08-09 DIAGNOSIS — R053 Chronic cough: Secondary | ICD-10-CM

## 2023-08-09 MED ORDER — BENZONATATE 100 MG PO CAPS: 100.0000 mg | ORAL_CAPSULE | Freq: Three times a day (TID) | ORAL | 0 refills | Status: DC | PRN

## 2023-08-09 MED ORDER — PROMETHAZINE-DM 6.25-15 MG/5ML PO SYRP: 5.0000 mL | ORAL_SOLUTION | Freq: Four times a day (QID) | ORAL | 0 refills | Status: DC | PRN

## 2023-08-09 MED ORDER — DOXYCYCLINE HYCLATE 100 MG PO CAPS: 100.0000 mg | ORAL_CAPSULE | Freq: Two times a day (BID) | ORAL | 0 refills | Status: AC

## 2023-08-09 NOTE — ED Triage Notes (Signed)
 Pt c/o cough and chest congestion for over month. Taken OTC meds with little relief.

## 2023-08-09 NOTE — ED Provider Notes (Signed)
 [facility]    CSN: [id number] Arrival date & time: 08/09/23  1112      History   Chief Complaint Chief Complaint  Patient presents with   Cough    HPI Tammy Frost is a 43 y.o. female.  3-4 week history of cough Somewhat productive Not having shortness of breath, wheezing, chest tightness Symptoms can be worse at night when laying flat No fevers Possible sick contacts at start of symptoms Has tried robitussin, delsym, nyquil  Past Medical History:  Diagnosis Date   Abdominal gas pain 12/25/2019   Anxiety    Chicken pox    Chronic cholecystitis with calculus    Depression    Gallstones    UTI (urinary tract infection) 07-20-15    Patient Active Problem List   Diagnosis Date Noted   Closed fracture of tooth 05/02/2020   URI with cough and congestion 01/25/2020   Upper respiratory tract infection 01/25/2020   Body mass index (BMI) 36.0-36.9, adult 01/25/2020   Low serum vitamin B12 01/25/2020   Vitamin D  deficiency 01/25/2020   Anxiety and depression 11/08/2019   Encounter for preventative adult health care examination 11/08/2019   Situational anxiety 11/01/2019   Obesity, morbid, BMI 40.0-49.9 (HCC) 11/01/2019    Past Surgical History:  Procedure Laterality Date   APPENDECTOMY     CESAREAN SECTION  12/27/2011   CESAREAN SECTION  12/31/2013   CHOLECYSTECTOMY N/A 09/02/2015   Procedure: LAPAROSCOPIC CHOLECYSTECTOMY WITH INTRAOPERATIVE CHOLANGIOGRAM;  Surgeon: Kandis Ormond, MD;  Location: [facility];  Service: General;  Laterality: N/A;   LAPAROSCOPIC APPENDECTOMY N/A 09/04/2017   Procedure: APPENDECTOMY LAPAROSCOPIC;  Surgeon: Alben Alma, MD;  Location: [facility];  Service: General;  Laterality: N/A;    OB History     Gravida  1   Para      Term      Preterm      AB      Living         SAB      IAB      Ectopic      Multiple      Live Births               Home Medications    Prior to Admission medications    Medication Sig Start Date End Date Taking? Authorizing Provider  benzonatate  (TESSALON ) 100 MG capsule Take 1 capsule (100 mg total) by mouth 3 (three) times daily as needed for cough. 08/09/23  Yes Labrandon Knoch, Ivette Marks, PA-C  doxycycline (VIBRAMYCIN) 100 MG capsule Take 1 capsule (100 mg total) by mouth 2 (two) times daily for 7 days. 08/09/23 08/16/23 Yes Jontavious Commons, Ivette Marks, PA-C  promethazine -dextromethorphan (PROMETHAZINE -DM) 6.25-15 MG/5ML syrup Take 5 mLs by mouth 4 (four) times daily as needed for cough. 08/09/23  Yes Resha Filippone, Ivette Marks, PA-C  cyanocobalamin  (,VITAMIN B-12,) 1000 MCG/ML injection 1000 mcg (1 mg) injection once per week for four weeks, followed by 1000 mcg injection once per month. 11/08/19   Rhina Center, NP  ergocalciferol  (VITAMIN D2) 1.25 MG (50000 UT) capsule Take 1 capsule (50,000 Units total) by mouth once a week. 11/05/22   McDonough, Lillie Reining, PA-C  fexofenadine  (ALLEGRA  ALLERGY) 180 MG tablet Take 1 tablet (180 mg total) by mouth daily. 09/14/22   Rodriguez-Southworth, Sylvia, PA-C  folic acid  (FOLVITE ) 1 MG tablet Take 1 tablet (1 mg total) by mouth daily. 11/05/22   McDonough, Lillie Reining, PA-C  ibuprofen  (ADVIL ) 600 MG tablet Take 1 tablet (600  mg total) by mouth every 6 (six) hours as needed. 10/08/22   Alyrica Thurow, Ivette Marks, PA-C  albuterol  (PROVENTIL  HFA;VENTOLIN  HFA) 108 (90 Base) MCG/ACT inhaler Inhale 2 puffs into the lungs every 6 (six) hours as needed for wheezing or shortness of breath. Patient not taking: Reported on 07/10/2018 10/03/17 09/19/18  Isa Manuel, MD    Family History Family History  Problem Relation Age of Onset   Cancer Maternal Grandmother        lung   Emphysema Maternal Grandmother    Depression Maternal Grandmother    Heart disease Maternal Grandmother    Hyperlipidemia Maternal Grandmother    Hypertension Maternal Grandmother    Stroke Maternal Grandmother    Cancer Maternal Grandfather        pancreatic   Hypertension Mother    Depression Mother     Hyperlipidemia Mother    Asthma Son    Cancer Paternal Grandmother    Cancer Paternal Grandfather    Heart disease Paternal Grandfather    Alcohol abuse Neg Hx    Arthritis Neg Hx    Birth defects Neg Hx    COPD Neg Hx    Diabetes Neg Hx    Drug abuse Neg Hx    Early death Neg Hx    Hearing loss Neg Hx    Kidney disease Neg Hx    Learning disabilities Neg Hx    Mental illness Neg Hx    Mental retardation Neg Hx    Miscarriages / Stillbirths Neg Hx    Vision loss Neg Hx    Varicose Veins Neg Hx     Social History Social History   Tobacco Use   Smoking status: Never   Smokeless tobacco: Never  Vaping Use   Vaping status: Never Used  Substance Use Topics   Alcohol use: No   Drug use: Never     Allergies   Sulfa antibiotics and Flagyl [metronidazole]   Review of Systems Review of Systems As per HPI   Physical Exam Triage Vital Signs ED Triage Vitals  Encounter Vitals Group     BP 08/09/23 1231 (!) 139/92     Systolic BP Percentile --      Diastolic BP Percentile --      Pulse Rate 08/09/23 1231 91     Resp 08/09/23 1231 18     Temp 08/09/23 1231 98.2 F (36.8 C)     Temp Source 08/09/23 1231 Oral     SpO2 08/09/23 1231 99 %     Weight --      Height --      Head Circumference --      Peak Flow --      Pain Score 08/09/23 1232 4     Pain Loc --      Pain Education --      Exclude from Growth Chart --    No data found.  Updated Vital Signs BP (!) 139/92 (BP Location: Right Arm)   Pulse 91   Temp 98.2 F (36.8 C) (Oral)   Resp 18   LMP 08/09/2023 (Exact Date)   SpO2 99%   Visual Acuity Right Eye Distance:   Left Eye Distance:   Bilateral Distance:    Right Eye Near:   Left Eye Near:    Bilateral Near:     Physical Exam Vitals and nursing note reviewed.  Constitutional:      Appearance: She is not ill-appearing.  HENT:     Right Ear: Tympanic  membrane and ear canal normal.     Left Ear: Tympanic membrane and ear canal normal.      Nose: No congestion or rhinorrhea.     Mouth/Throat:     Mouth: Mucous membranes are moist.     Pharynx: Oropharynx is clear. No posterior oropharyngeal erythema.  Eyes:     Conjunctiva/sclera: Conjunctivae normal.  Cardiovascular:     Rate and Rhythm: Normal rate and regular rhythm.     Pulses: Normal pulses.     Heart sounds: Normal heart sounds.  Pulmonary:     Effort: Pulmonary effort is normal. No respiratory distress.     Breath sounds: Normal breath sounds. No wheezing, rhonchi or rales.  Musculoskeletal:     Cervical back: Normal range of motion.  Lymphadenopathy:     Cervical: No cervical adenopathy.  Skin:    General: Skin is warm and dry.  Neurological:     Mental Status: She is alert and oriented to person, place, and time.     UC Treatments / Results  Labs (all labs ordered are listed, but only abnormal results are displayed) Labs Reviewed - No data to display  EKG   Radiology No results found.  Procedures Procedures (including critical care time)  Medications Ordered in UC Medications - No data to display  Initial Impression / Assessment and Plan / UC Course  I have reviewed the triage vital signs and the nursing notes.  Pertinent labs & imaging results that were available during my care of the patient were reviewed by me and considered in my medical decision making (see chart for details).  Afebrile, well-appearing, clear lungs throughout. About a month now of chest congestion with cough.  Discussed and offered chest x-ray, shared decision making we will defer at this time.  Treat for possible bacterial etiology with doxycycline twice daily for 7 days.  I have also sent Tessalon  and promethazine  cough syrup.  Patient can return for imaging if not having any improvement after about 5 days of medicine.  Final Clinical Impressions(s) / UC Diagnoses   Final diagnoses:  Acute bacterial bronchitis  Persistent cough for 3 weeks or longer      Discharge Instructions      Please take doxycycline antibiotic as prescribed. Take with food to avoid upset stomach.  The tessalon  cough pills can be taken 3x daily. The promethazine  DM cough syrup can be used up to 4 times daily. If this medication makes you drowsy, take only once before bed.  If 5 days of antibiotic are finished and you don't feel any improvement, please return for chest xary   ED Prescriptions     Medication Sig Dispense Auth. Provider   doxycycline (VIBRAMYCIN) 100 MG capsule Take 1 capsule (100 mg total) by mouth 2 (two) times daily for 7 days. 14 capsule Olimpia Tinch, PA-C   promethazine -dextromethorphan (PROMETHAZINE -DM) 6.25-15 MG/5ML syrup Take 5 mLs by mouth 4 (four) times daily as needed for cough. 240 mL Leshonda Galambos, PA-C   benzonatate  (TESSALON ) 100 MG capsule Take 1 capsule (100 mg total) by mouth 3 (three) times daily as needed for cough. 30 capsule Hershy Flenner, Ivette Marks, PA-C      PDMP not reviewed this encounter.   Talib Headley, PA-C 08/09/23 1359

## 2023-08-09 NOTE — Discharge Instructions (Addendum)
 Please take doxycycline antibiotic as prescribed. Take with food to avoid upset stomach.  The tessalon  cough pills can be taken 3x daily. The promethazine  DM cough syrup can be used up to 4 times daily. If this medication makes you drowsy, take only once before bed.  If 5 days of antibiotic are finished and you don't feel any improvement, please return for chest xary

## 2023-12-05 ENCOUNTER — Encounter: Payer: Medicaid Other | Admitting: Physician Assistant

## 2023-12-09 ENCOUNTER — Telehealth: Admitting: Physician Assistant

## 2023-12-09 DIAGNOSIS — R3989 Other symptoms and signs involving the genitourinary system: Secondary | ICD-10-CM | POA: Diagnosis not present

## 2023-12-09 MED ORDER — NITROFURANTOIN MONOHYD MACRO 100 MG PO CAPS
100.0000 mg | ORAL_CAPSULE | Freq: Two times a day (BID) | ORAL | 0 refills | Status: DC
Start: 2023-12-09 — End: 2023-12-10

## 2023-12-09 NOTE — Progress Notes (Signed)

## 2023-12-10 MED ORDER — CEPHALEXIN 500 MG PO CAPS: 500.0000 mg | ORAL_CAPSULE | Freq: Two times a day (BID) | ORAL | 0 refills | Status: DC

## 2023-12-10 NOTE — Addendum Note (Signed)
 Addended by: Remingtyn Depaola on: 12/10/2023 02:40 PM   Modules accepted: Orders

## 2023-12-10 NOTE — Progress Notes (Signed)
 Patient states Macrobid , prescribed for UTI is causing Pt to itch.  I will advise Pt to stop macrobid  and I will send Keflex  instead. I will add Macrobid  to Pt's list of allergies.

## 2023-12-12 MED ORDER — AMOXICILLIN-POT CLAVULANATE 875-125 MG PO TABS
1.0000 | ORAL_TABLET | Freq: Two times a day (BID) | ORAL | 0 refills | Status: DC
Start: 2023-12-12 — End: 2024-01-28

## 2023-12-12 NOTE — Addendum Note (Signed)
 Addended by: VIVIENNE DELON HERO on: 12/12/2023 02:14 PM   Modules accepted: Orders

## 2024-01-28 ENCOUNTER — Telehealth: Admitting: Physician Assistant

## 2024-01-28 DIAGNOSIS — K0889 Other specified disorders of teeth and supporting structures: Secondary | ICD-10-CM | POA: Diagnosis not present

## 2024-01-28 MED ORDER — AMOXICILLIN-POT CLAVULANATE 875-125 MG PO TABS: 1.0000 | ORAL_TABLET | Freq: Two times a day (BID) | ORAL | 0 refills | Status: AC

## 2024-01-28 MED ORDER — NAPROXEN 500 MG PO TABS: 500.0000 mg | ORAL_TABLET | Freq: Two times a day (BID) | ORAL | 0 refills | Status: AC

## 2024-01-28 NOTE — Progress Notes (Signed)
 E-Visit for Dental Pain  We are sorry that you are not feeling well.  Here is how we plan to help!  Based on what you have shared with me in the questionnaire, it sounds like you have a possible dental abscess.   Augmentin  875-125mg  twice a day for 7 days and Naprosyn  500mg  2 times a day for 7 days for discomfort  It is imperative that you see a dentist within 10 days of this eVisit to determine the cause of the dental pain and be sure it is adequately treated  A toothache or tooth pain is caused when the nerve in the root of a tooth or surrounding a tooth is irritated. Dental (tooth) infection, decay, injury, or loss of a tooth are the most common causes of dental pain. Pain may also occur after an extraction (tooth is pulled out). Pain sometimes originates from other areas and radiates to the jaw, thus appearing to be tooth pain.Bacteria growing inside your mouth can contribute to gum disease and dental decay, both of which can cause pain. A toothache occurs from inflammation of the central portion of the tooth called pulp. The pulp contains nerve endings that are very sensitive to pain. Inflammation to the pulp or pulpitis may be caused by dental cavities, trauma, and infection.    HOME CARE:   For toothaches: Over-the-counter pain medications such as acetaminophen  or ibuprofen  may be used. Take these as directed on the package while you arrange for a dental appointment. Avoid very cold or hot foods, because they may make the pain worse. You may get relief from biting on a cotton ball soaked in oil of cloves. You can get oil of cloves at most drug stores.  For jaw pain:  Aspirin may be helpful for problems in the joint of the jaw in adults. If pain happens every time you open your mouth widely, the temporomandibular joint (TMJ) may be the source of the pain. Yawning or taking a large bite of food may worsen the pain. An appointment with your doctor or dentist will help you find the cause.      GET HELP RIGHT AWAY IF:  You have a high fever or chills If you have had a recent head or face injury and develop headache, light headedness, nausea, vomiting, or other symptoms that concern you after an injury to your face or mouth, you could have a more serious injury in addition to your dental injury. A facial rash associated with a toothache: This condition may improve with medication. Contact your doctor for them to decide what is appropriate. Any jaw pain occurring with chest pain: Although jaw pain is most commonly caused by dental disease, it is sometimes referred pain from other areas. People with heart disease, especially people who have had stents placed, people with diabetes, or those who have had heart surgery may have jaw pain as a symptom of heart attack or angina. If your jaw or tooth pain is associated with lightheadedness, sweating, or shortness of breath, you should see a doctor as soon as possible. Trouble swallowing or excessive pain or bleeding from gums: If you have a history of a weakened immune system, diabetes, or steroid use, you may be more susceptible to infections. Infections can often be more severe and extensive or caused by unusual organisms. Dental and gum infections in people with these conditions may require more aggressive treatment. An abscess may need draining or IV antibiotics, for example.  MAKE SURE YOU   Understand  these instructions. Will watch your condition. Will get help right away if you are not doing well or get worse.  Thank you for choosing an e-visit.  Your e-visit answers were reviewed by a board certified advanced clinical practitioner to complete your personal care plan. Depending upon the condition, your plan could have included both over the counter or prescription medications.  Please review your pharmacy choice. Make sure the pharmacy is open so you can pick up prescription now. If there is a problem, you may contact your provider  through [employer] and have the prescription routed to another pharmacy.  Your safety is important to us . If you have drug allergies check your prescription carefully.   For the next 24 hours you can use MyChart to ask questions about today's visit, request a non-urgent call back, or ask for a work or school excuse. You will get an email in the next two days asking about your experience. I hope that your e-visit has been valuable and will speed your recovery.  I have spent 5 minutes in review of e-visit questionnaire, review and updating patient chart, medical decision making and response to patient.   Harlene PEDLAR Ward, PA-C
